# Patient Record
Sex: Male | Born: 1935 | Race: White | Hispanic: No | Marital: Married | Smoking: Former smoker
Health system: Southern US, Community
[De-identification: ages and names within clinical notes are randomized; demographics above are authoritative.]

## PROBLEM LIST (undated history)

## (undated) DIAGNOSIS — Z87442 Personal history of urinary calculi: Secondary | ICD-10-CM

## (undated) DIAGNOSIS — M1990 Unspecified osteoarthritis, unspecified site: Secondary | ICD-10-CM

## (undated) DIAGNOSIS — I10 Essential (primary) hypertension: Secondary | ICD-10-CM

## (undated) DIAGNOSIS — I219 Acute myocardial infarction, unspecified: Secondary | ICD-10-CM

## (undated) DIAGNOSIS — I2510 Atherosclerotic heart disease of native coronary artery without angina pectoris: Secondary | ICD-10-CM

## (undated) HISTORY — PX: TONSILLECTOMY: SUR1361

## (undated) HISTORY — PX: JOINT REPLACEMENT: SHX530

## (undated) HISTORY — PX: INNER EAR SURGERY: SHX679

---

## 1964-06-30 HISTORY — PX: KIDNEY STONE SURGERY: SHX686

## 1996-06-30 DIAGNOSIS — I219 Acute myocardial infarction, unspecified: Secondary | ICD-10-CM

## 1996-06-30 HISTORY — DX: Acute myocardial infarction, unspecified: I21.9

## 1996-06-30 HISTORY — PX: BYPASS GRAFT: SHX909

## 1996-06-30 HISTORY — PX: CORONARY ARTERY BYPASS GRAFT: SHX141

## 1997-06-30 HISTORY — PX: OTHER SURGICAL HISTORY: SHX169

## 1999-06-03 ENCOUNTER — Encounter: Payer: Self-pay | Admitting: Orthopedic Surgery

## 1999-06-10 ENCOUNTER — Encounter: Payer: Self-pay | Admitting: Orthopedic Surgery

## 1999-06-10 ENCOUNTER — Inpatient Hospital Stay (HOSPITAL_COMMUNITY): Admission: RE | Admit: 1999-06-10 | Discharge: 1999-06-14 | Payer: Self-pay | Admitting: Orthopedic Surgery

## 2000-03-26 ENCOUNTER — Ambulatory Visit (HOSPITAL_COMMUNITY): Admission: RE | Admit: 2000-03-26 | Discharge: 2000-03-27 | Payer: Self-pay | Admitting: Cardiovascular Disease

## 2010-06-13 ENCOUNTER — Encounter
Admission: RE | Admit: 2010-06-13 | Discharge: 2010-06-13 | Payer: Self-pay | Source: Home / Self Care | Attending: Otolaryngology | Admitting: Otolaryngology

## 2010-06-18 ENCOUNTER — Encounter
Admission: RE | Admit: 2010-06-18 | Discharge: 2010-06-18 | Payer: Self-pay | Source: Home / Self Care | Attending: Otolaryngology | Admitting: Otolaryngology

## 2012-10-21 ENCOUNTER — Other Ambulatory Visit (HOSPITAL_COMMUNITY): Payer: Self-pay | Admitting: Cardiovascular Disease

## 2012-10-21 DIAGNOSIS — E782 Mixed hyperlipidemia: Secondary | ICD-10-CM

## 2012-10-21 DIAGNOSIS — I10 Essential (primary) hypertension: Secondary | ICD-10-CM

## 2012-10-21 DIAGNOSIS — I2510 Atherosclerotic heart disease of native coronary artery without angina pectoris: Secondary | ICD-10-CM

## 2012-11-03 ENCOUNTER — Ambulatory Visit (HOSPITAL_COMMUNITY)
Admission: RE | Admit: 2012-11-03 | Discharge: 2012-11-03 | Disposition: A | Discharge status: Home / Self Care | Payer: Medicare Other | Source: Ambulatory Visit | Attending: Cardiovascular Disease | Admitting: Cardiovascular Disease

## 2012-11-03 DIAGNOSIS — R42 Dizziness and giddiness: Secondary | ICD-10-CM | POA: Insufficient documentation

## 2012-11-03 DIAGNOSIS — R5381 Other malaise: Secondary | ICD-10-CM | POA: Insufficient documentation

## 2012-11-03 DIAGNOSIS — E782 Mixed hyperlipidemia: Secondary | ICD-10-CM

## 2012-11-03 DIAGNOSIS — I2510 Atherosclerotic heart disease of native coronary artery without angina pectoris: Secondary | ICD-10-CM

## 2012-11-03 DIAGNOSIS — R5383 Other fatigue: Secondary | ICD-10-CM | POA: Insufficient documentation

## 2012-11-03 DIAGNOSIS — I10 Essential (primary) hypertension: Secondary | ICD-10-CM | POA: Insufficient documentation

## 2012-11-03 MED ORDER — TECHNETIUM TC 99M SESTAMIBI GENERIC - CARDIOLITE
10.2000 | Freq: Once | INTRAVENOUS | Status: AC | PRN
  Administered 2012-11-03: 10 via INTRAVENOUS

## 2012-11-03 MED ORDER — TECHNETIUM TC 99M SESTAMIBI GENERIC - CARDIOLITE
31.0000 | Freq: Once | INTRAVENOUS | Status: AC | PRN
  Administered 2012-11-03: 31 via INTRAVENOUS

## 2012-11-03 NOTE — Procedures (Addendum)
 [facility] [address] [place] [address] [place] [address] [phone]  Cardiology Nuclear Med Study  Jared Dunlap is a 79 y.o. male     MRN : [id number]     DOB: 05/11/1932  Procedure Date: 11/03/2012  Nuclear Med Background Indication for Stress Test:  Graft Patency, Stent Patency and PTCA Patency History:  CAD;MI;CABG X6--01/1997;STENT/PTCA--03/26/2000 Cardiac Risk Factors: Family History - CAD, History of Smoking, Hypertension, Lipids and Overweight  Symptoms:  Light-Headedness   Nuclear Pre-Procedure Caffeine/Decaff Intake:  7:00pm NPO After: 5:00am   IV Site: R Hand  IV 0.9% NS with Angio Cath:  22g  Chest Size (in):  42"  IV Started by: Emmit Pomfret, RN  Height: 5\' 4"  (1.626 m)  Cup Size: n/a  BMI:  Body mass index is 30.02 kg/(m^2). Weight:  175 lb (79.379 kg)   Tech Comments:  N/A    Nuclear Med Study 1 or 2 day study: 1 day  Stress Test Type:  Stress  Order Authorizing Provider:  Nicki Guadalajara, MD   Resting Radionuclide: Technetium 89m Sestamibi  Resting Radionuclide Dose: 10.2 mCi   Stress Radionuclide:  Technetium 61m Sestamibi  Stress Radionuclide Dose: 31.0 mCi           Stress Protocol Rest HR: 67 Stress HR: 139  Rest BP: 164/87 Stress BP: 197/85  Exercise Time (min): 6:30 METS: 7.8   Predicted Max HR: 142 bpm % Max HR: 97.89 bpm Rate Pressure Product: 21308  Dose of Adenosine (mg):  n/a Dose of Lexiscan: n/a mg  Dose of Atropine (mg): n/a Dose of Dobutamine: n/a mcg/kg/min (at max HR)  Stress Test Technologist: Esperanza Sheets, CCT Nuclear Technologist: Gonzella Lex, CNMT   Rest Procedure:  Myocardial perfusion imaging was performed at rest 45 minutes following the intravenous administration of Technetium 66m Sestamibi. Stress Procedure:  The patient performed treadmill exercise using a Bruce  Protocol for 6:30 minutes. The patient stopped due to Fatigue and denied any chest pain.  There were no  significant ST-T wave changes.  Technetium 66m Sestamibi was injected at peak exercise and myocardial perfusion imaging was performed after a brief delay.  Transient Ischemic Dilatation (Normal <1.22):  0.99 Lung/Heart Ratio (Normal <0.45):  0.26 QGS EDV:  n/a ml QGS ESV:  n/a ml LV Ejection Fraction: Study not gated  Rest ECG: NSR - Normal EKG  Stress ECG: There are scattered PVCs.couplets and triplets.  QPS Raw Data Images:  Normal; no motion artifact; normal heart/lung ratio. Stress Images:  Fixed anteroseptal and apical defect and basal lateral defect Rest Images:  Fixed anteroseptal and apical defect and basal lateral defect Subtraction (SDS):  No evidence of ischemia.  Impression Exercise Capacity:  Fair exercise capacity. BP Response:  Hypertensive blood pressure response. Clinical Symptoms:  No significant symptoms noted. ECG Impression:  There are scattered PVCs. Comparison with Prior Nuclear Study: No significant change from previous study  Overall Impression:  Low risk stress nuclear study with fixed anteroseptal and apical defects and basal lateral defects which may represent scar. No reversible ischemia.  LV Wall Motion:  Non-gated study.  Jared Nose, MD, [facility] Board Certified in Nuclear Cardiology Attending Cardiologist [facility]  Jared Nose, MD  11/03/2012 2:55 PM

## 2012-11-26 ENCOUNTER — Telehealth: Payer: Self-pay | Admitting: *Deleted

## 2012-11-26 NOTE — Telephone Encounter (Signed)
Results of stress test given.

## 2012-11-26 NOTE — Telephone Encounter (Signed)
Message copied by Gaynelle Cage on Fri Nov 26, 2012  5:46 PM ------      Message from: Nicki Guadalajara A      Created: Thu Nov 25, 2012  4:12 PM       Low risk, old small scar ------

## 2012-12-13 ENCOUNTER — Ambulatory Visit: Payer: Medicare Other | Admitting: Cardiovascular Disease

## 2012-12-28 ENCOUNTER — Other Ambulatory Visit: Payer: Self-pay | Admitting: *Deleted

## 2012-12-28 MED ORDER — BENAZEPRIL HCL 40 MG PO TABS: 40.0000 mg | ORAL_TABLET | Freq: Every day | ORAL | Status: DC

## 2013-04-18 ENCOUNTER — Encounter: Payer: Self-pay | Admitting: Cardiovascular Disease

## 2013-04-18 ENCOUNTER — Ambulatory Visit (INDEPENDENT_AMBULATORY_CARE_PROVIDER_SITE_OTHER): Payer: Medicare Other | Admitting: Cardiovascular Disease

## 2013-04-18 VITALS — BP 150/80 | HR 62 | Ht 64.0 in | Wt 181.6 lb

## 2013-04-18 DIAGNOSIS — E785 Hyperlipidemia, unspecified: Secondary | ICD-10-CM

## 2013-04-18 DIAGNOSIS — I10 Essential (primary) hypertension: Secondary | ICD-10-CM

## 2013-04-18 DIAGNOSIS — I25810 Atherosclerosis of coronary artery bypass graft(s) without angina pectoris: Secondary | ICD-10-CM

## 2013-04-18 HISTORY — DX: Atherosclerosis of coronary artery bypass graft(s) without angina pectoris: I25.810

## 2013-04-18 HISTORY — DX: Essential (primary) hypertension: I10

## 2013-04-18 HISTORY — DX: Hyperlipidemia, unspecified: E78.5

## 2013-04-18 NOTE — Patient Instructions (Addendum)
Your physician recommends that you schedule a follow-up appointment in: 6 MONTHS 

## 2013-04-18 NOTE — Progress Notes (Signed)
Patient ID: Jared Dunlap, male   DOB: 05-20-1932, 79 y.o.   MRN: [id number]     HPI: Jared Dunlap is a 79 y.o. male who presents to the office for a one-year cardiology evaluation.  This Fines is now 79 years old. In August 1998 he underwent CABG surgery x6 with LIMA to the LAD, saphenous vein graft to diagonal, sequential vein graft to the L1 and L2, and vein graft to the right coronary artery. 2001 a Cardiolite study which was done routinely and the patient was asymptomatic immaturity new circumflex ischemia. Catheterization revealed total occlusion of the graft to the marginal 1 vessel but he did have a patent sequential limb from OM1-OM 2. There was a 95% native circumflex stenosis  And II was able to successfully perform PCI and stenting of the native circumflex vessel with a 3.5x15 mm bare metal  NIR stent. Subsequent nuclear perfusion studies has continued to show normal fairly normal perfusion. His last nuclear perfusion study was done in May 2014 which was low risk showed mild apical thinning and basal lateral bending without associated ischemia.  JaredDunlap is remaining active. He plays golf at least 3 days per week. He exercises at the gym at least 3 days per week both doing aerobics as well as weights. He remains active doing yard work as well. He does have a history of hyperlipidemia, hypertension. He will be seeing Dr. Pryor Montes if it next several weeks and completes the laboratory obtained at that time. His last laboratory in January 2014 did show a total cholesterol 145, triglyceride 66, HDL 30, LDL cholesterol 90.  History reviewed. No pertinent past medical history.  History reviewed. No pertinent past surgical history.  Allergies  Allergen Reactions  . Crestor [Rosuvastatin] Other (See Comments)    UNSPECIFIED  . Welchol [Colesevelam Hcl] Other (See Comments)    UNSPECIFIED  . Zetia [Ezetimibe] Other (See Comments)    UNSPECIFIED  . Zocor [Simvastatin] Other (See  Comments)    unspecified    Current Outpatient Prescriptions  Medication Sig Dispense Refill  . atorvastatin (LIPITOR) 20 MG tablet Take 20 mg by mouth daily.      . benazepril (LOTENSIN) 40 MG tablet Take 1 tablet (40 mg total) by mouth daily.  30 tablet  6  . Coenzyme Q10 (COQ10) 100 MG CAPS Take by mouth.      . Flaxseed, Linseed, (FLAX SEEDS PO) Take by mouth.      . folic acid (FOLVITE) 400 MCG tablet Take 400 mcg by mouth daily.      . metoprolol succinate (TOPROL-XL) 25 MG 24 hr tablet Take 25 mg by mouth daily.      . Misc Natural Products (OSTEO BI-FLEX TRIPLE STRENGTH PO) Take 1 tablet by mouth.      . Multiple Vitamin (MULTIVITAMIN) tablet Take 1 tablet by mouth daily.      . Omega-3 Fatty Acids (FISH OIL) 1000 MG CAPS Take by mouth.      . pyridOXINE (B-6) 50 MG tablet Take 50 mg by mouth daily.      . vitamin B-12 (CYANOCOBALAMIN) 500 MCG tablet Take 500 mcg by mouth daily.       No current facility-administered medications for this visit.    History   Social History  . Marital Status: Married    Spouse Name: N/A    Number of Children: N/A  . Years of Education: N/A   Occupational History  . Not on file.   Social History  Main Topics  . Smoking status: Former Smoker    Types: Cigarettes    Quit date: 04/19/1963  . Smokeless tobacco: Not on file  . Alcohol Use: No  . Drug Use: No  . Sexual Activity: Not on file   Other Topics Concern  . Not on file   Social History Narrative  . No narrative on file    Family History  Problem Relation Age of Onset  . Heart disease Mother   . Heart disease Father   . Heart disease Brother   . Cancer Brother   . Heart disease Brother   . Kidney Stones Brother    Socially he is married for 59 years. 3 children 4 grandchildren 3 great-grandchildren. There is no alcohol use. He quit tobacco in 1965.  ROS is negative for fevers, chills or night sweats. He denies visual symptoms. He denies cough. Denies sputum production.  He denies recent presyncope. He denies chest pressure. He denies abdominal pain. Denies nausea vomiting diarrhea. He denies hematochezia or hematuria. He denies claudication. He is unaware of significant leg swelling. He denies myalgias. He denies skin rash.   Other comprehensive 12 point system review is negative.  PE BP 150/80  Pulse 62  Ht 5\' 4"  (1.626 m)  Wt 181 lb 9.6 oz (82.373 kg)  BMI 31.16 kg/m2  Repeat BP 122/78 General: Alert, oriented, no distress.  Skin: normal turgor, no rashes HEENT: Normocephalic, atraumatic. Pupils round and reactive; sclera anicteric;no lid lag.  Nose without nasal septal hypertrophy Mouth/Parynx benign; Mallinpatti scale 3 Neck: No JVD, no carotid briuts Lungs: clear to ausculatation and percussion; no wheezing or rales Heart: RRR, s1 s2 normal 1/6 systolic murmur Abdomen: soft, nontender; no hepatosplenomehaly, BS+; abdominal aorta nontender and not dilated by palpation. Pulses 2+ Extremities: Trivial pretibial edema above the sock line; no clubbing cyanosis, Homan's sign negative  Neurologic: grossly nonfocal Psychologic: normal affect and mood.  ECG: Normal sinus rhythm. QTc interval 399 ms.  LABS:  BMET No results found for this basename: na, k, cl, co2, glucose, bun, creatinine, calcium, gfrnonaa, gfraa     Hepatic Function Panel  No results found for this basename: prot, albumin, ast, alt, alkphos, bilitot, bilidir, ibili     CBC No results found for this basename: wbc, rbc, hgb, hct, plt, mcv, mch, mchc, rdw, neutrabs, lymphsabs, monoabs, eosabs, basosabs     BNP No results found for this basename: probnp    Lipid Panel  No results found for this basename: chol, trig, hdl, cholhdl, vldl, ldlcalc     RADIOLOGY: No results found.    ASSESSMENT AND PLAN:  Jared Dunlap is now 18 years status post CABG surgery x6. In 2001 he was entirely asymptomatic but a nuclear perfusion study demonstrated a large area of  ischemia laterally and at that time cardiac catheterization revealed an occluded graft that supplied the circumflex marginal vessel. I performed a successful percutaneous coronary intervention to his native RCA. Subsequently, he has continued to do well. He remains very active. Target LDL is less than 70. He was having repeat laboratory check by Dr. Wylene Simmer in several weeks and I will ask that these be forwarded to me for my review. His blood pressure on repeat today was controlled at 122/78. Pulse is regular. The past, he had intolerance to [facility] as well as Zetia Crestor and simvastatin, but he seems to tolerate atorvastatin well. As long as he remains stable, we'll see him in 6 months a Cardiologic reevaluation or  sooner as needed.    Lennette Bihari, MD, [facility]  04/18/2013 10:33 AM

## 2013-04-19 ENCOUNTER — Encounter: Payer: Self-pay | Admitting: Cardiovascular Disease

## 2013-05-11 ENCOUNTER — Observation Stay (HOSPITAL_COMMUNITY)
Admission: EM | Admit: 2013-05-11 | Discharge: 2013-05-12 | Disposition: A | Discharge status: Home / Self Care | Payer: Medicare Other | Attending: Urology | Admitting: Urology

## 2013-05-11 ENCOUNTER — Emergency Department (HOSPITAL_COMMUNITY): Payer: Medicare Other

## 2013-05-11 ENCOUNTER — Observation Stay (HOSPITAL_COMMUNITY): Payer: Medicare Other | Admitting: Anesthesiology

## 2013-05-11 ENCOUNTER — Encounter (HOSPITAL_COMMUNITY): Payer: Medicare Other | Admitting: Anesthesiology

## 2013-05-11 ENCOUNTER — Encounter (HOSPITAL_COMMUNITY): Payer: Self-pay | Admitting: Emergency Medicine

## 2013-05-11 ENCOUNTER — Encounter (HOSPITAL_COMMUNITY)
Admission: EM | Disposition: A | Discharge status: Home / Self Care | Payer: Self-pay | Source: Home / Self Care | Attending: Urology

## 2013-05-11 DIAGNOSIS — Z96649 Presence of unspecified artificial hip joint: Secondary | ICD-10-CM | POA: Insufficient documentation

## 2013-05-11 DIAGNOSIS — I10 Essential (primary) hypertension: Secondary | ICD-10-CM | POA: Insufficient documentation

## 2013-05-11 DIAGNOSIS — I252 Old myocardial infarction: Secondary | ICD-10-CM | POA: Insufficient documentation

## 2013-05-11 DIAGNOSIS — N1330 Unspecified hydronephrosis: Secondary | ICD-10-CM | POA: Insufficient documentation

## 2013-05-11 DIAGNOSIS — Z951 Presence of aortocoronary bypass graft: Secondary | ICD-10-CM | POA: Insufficient documentation

## 2013-05-11 DIAGNOSIS — Z79899 Other long term (current) drug therapy: Secondary | ICD-10-CM | POA: Insufficient documentation

## 2013-05-11 DIAGNOSIS — N210 Calculus in bladder: Secondary | ICD-10-CM | POA: Insufficient documentation

## 2013-05-11 DIAGNOSIS — N200 Calculus of kidney: Secondary | ICD-10-CM | POA: Insufficient documentation

## 2013-05-11 DIAGNOSIS — I2510 Atherosclerotic heart disease of native coronary artery without angina pectoris: Secondary | ICD-10-CM | POA: Insufficient documentation

## 2013-05-11 DIAGNOSIS — Z87891 Personal history of nicotine dependence: Secondary | ICD-10-CM | POA: Insufficient documentation

## 2013-05-11 DIAGNOSIS — N201 Calculus of ureter: Principal | ICD-10-CM | POA: Insufficient documentation

## 2013-05-11 HISTORY — PX: CYSTOSCOPY WITH RETROGRADE PYELOGRAM, URETEROSCOPY AND STENT PLACEMENT: SHX5789

## 2013-05-11 HISTORY — DX: Atherosclerotic heart disease of native coronary artery without angina pectoris: I25.10

## 2013-05-11 HISTORY — DX: Essential (primary) hypertension: I10

## 2013-05-11 HISTORY — DX: Acute myocardial infarction, unspecified: I21.9

## 2013-05-11 LAB — POCT I-STAT, CHEM 8
BUN: 27 mg/dL — ABNORMAL HIGH (ref 6–23)
Creatinine, Ser: 1.4 mg/dL — ABNORMAL HIGH (ref 0.50–1.35)
Glucose, Bld: 148 mg/dL — ABNORMAL HIGH (ref 70–99)
Potassium: 4 mEq/L (ref 3.5–5.1)
Sodium: 142 mEq/L (ref 135–145)

## 2013-05-11 LAB — COMPREHENSIVE METABOLIC PANEL
ALT: 21 U/L (ref 0–53)
AST: 33 U/L (ref 0–37)
Alkaline Phosphatase: 62 U/L (ref 39–117)
CO2: 24 mEq/L (ref 19–32)
Calcium: 9 mg/dL (ref 8.4–10.5)
GFR calc Af Amer: 63 mL/min — ABNORMAL LOW (ref >90)
GFR calc non Af Amer: 54 mL/min — ABNORMAL LOW (ref >90)
Glucose, Bld: 149 mg/dL — ABNORMAL HIGH (ref 70–99)
Potassium: 3.9 mEq/L (ref 3.5–5.1)
Sodium: 140 mEq/L (ref 135–145)
Total Protein: 6.7 g/dL (ref 6.0–8.3)

## 2013-05-11 LAB — CBC
Hemoglobin: 13.7 g/dL (ref 13.0–17.0)
MCH: 31.9 pg (ref 26.0–34.0)
Platelets: 145 10*3/uL — ABNORMAL LOW (ref 150–400)
RBC: 4.3 MIL/uL (ref 4.22–5.81)

## 2013-05-11 LAB — URINE MICROSCOPIC-ADD ON

## 2013-05-11 LAB — URINALYSIS, ROUTINE W REFLEX MICROSCOPIC
Bilirubin Urine: NEGATIVE
Glucose, UA: NEGATIVE mg/dL
Specific Gravity, Urine: 1.019 (ref 1.005–1.030)

## 2013-05-11 SURGERY — CYSTOURETEROSCOPY, WITH RETROGRADE PYELOGRAM AND STENT INSERTION
Anesthesia: General | Site: Ureter | Laterality: Right | Wound class: Clean Contaminated

## 2013-05-11 MED ORDER — PROMETHAZINE HCL 25 MG/ML IJ SOLN: 6.2500 mg | INTRAMUSCULAR | Status: DC | PRN

## 2013-05-11 MED ORDER — OXYCODONE HCL 5 MG PO TABS: 5.0000 mg | ORAL_TABLET | Freq: Once | ORAL | Status: AC | PRN

## 2013-05-11 MED ORDER — HYDROMORPHONE HCL PF 1 MG/ML IJ SOLN: 0.2500 mg | INTRAMUSCULAR | Status: DC | PRN

## 2013-05-11 MED ORDER — HYDROMORPHONE HCL PF 1 MG/ML IJ SOLN
0.5000 mg | INTRAMUSCULAR | Status: AC | PRN
  Administered 2013-05-11 (×2): 0.5 mg via INTRAVENOUS

## 2013-05-11 MED ORDER — HYDROMORPHONE HCL PF 1 MG/ML IJ SOLN
1.0000 mg | Freq: Once | INTRAMUSCULAR | Status: AC
  Administered 2013-05-11: 1 mg via INTRAVENOUS
  Filled 2013-05-11: qty 1

## 2013-05-11 MED ORDER — FENTANYL CITRATE 0.05 MG/ML IJ SOLN
INTRAMUSCULAR | Status: DC | PRN
  Administered 2013-05-11: 25 ug via INTRAVENOUS
  Administered 2013-05-11: 75 ug via INTRAVENOUS

## 2013-05-11 MED ORDER — ONDANSETRON HCL 4 MG/2ML IJ SOLN
INTRAMUSCULAR | Status: DC | PRN
  Administered 2013-05-11: 4 mg via INTRAVENOUS

## 2013-05-11 MED ORDER — SODIUM CHLORIDE 0.9 % IR SOLN
Status: DC | PRN
  Administered 2013-05-11: 20:00:00 2000 mL

## 2013-05-11 MED ORDER — SODIUM CHLORIDE 0.45 % IV SOLN
INTRAVENOUS | Status: AC
  Administered 2013-05-11: 11:00:00 100 mL/h via INTRAVENOUS

## 2013-05-11 MED ORDER — PHENYLEPHRINE HCL 10 MG/ML IJ SOLN
INTRAMUSCULAR | Status: DC | PRN
  Administered 2013-05-11: 40 ug via INTRAVENOUS

## 2013-05-11 MED ORDER — ONDANSETRON HCL 4 MG/2ML IJ SOLN: 4.0000 mg | Freq: Four times a day (QID) | INTRAMUSCULAR | Status: DC | PRN

## 2013-05-11 MED ORDER — METOPROLOL SUCCINATE ER 25 MG PO TB24
25.0000 mg | ORAL_TABLET | Freq: Every day | ORAL | Status: DC
  Administered 2013-05-11: 25 mg via ORAL
  Filled 2013-05-11 (×2): qty 1

## 2013-05-11 MED ORDER — BELLADONNA ALKALOIDS-OPIUM 16.2-60 MG RE SUPP
RECTAL | Status: AC
  Filled 2013-05-11: qty 1

## 2013-05-11 MED ORDER — METOPROLOL SUCCINATE ER 25 MG PO TB24: 25.0000 mg | ORAL_TABLET | Freq: Every day | ORAL | Status: DC

## 2013-05-11 MED ORDER — 0.9 % SODIUM CHLORIDE (POUR BTL) OPTIME
TOPICAL | Status: DC | PRN
  Administered 2013-05-11: 20:00:00 1000 mL

## 2013-05-11 MED ORDER — LACTATED RINGERS IV SOLN
INTRAVENOUS | Status: DC | PRN
  Administered 2013-05-11: 22:00:00
  Administered 2013-05-11: 20:00:00 via INTRAVENOUS

## 2013-05-11 MED ORDER — PROPOFOL INFUSION 10 MG/ML OPTIME
INTRAVENOUS | Status: DC | PRN
  Administered 2013-05-11: 15 mL via INTRAVENOUS

## 2013-05-11 MED ORDER — TAMSULOSIN HCL 0.4 MG PO CAPS
0.4000 mg | ORAL_CAPSULE | Freq: Every day | ORAL | Status: DC
  Administered 2013-05-11 – 2013-05-12 (×2): 0.4 mg via ORAL
  Filled 2013-05-11 (×2): qty 1

## 2013-05-11 MED ORDER — HYDROMORPHONE HCL PF 1 MG/ML IJ SOLN
0.5000 mg | INTRAMUSCULAR | Status: DC | PRN
  Administered 2013-05-12: 0.5 mg via INTRAVENOUS
  Filled 2013-05-11 (×3): qty 1

## 2013-05-11 MED ORDER — LIDOCAINE HCL 2 % EX GEL
CUTANEOUS | Status: AC
  Filled 2013-05-11: qty 10

## 2013-05-11 MED ORDER — IOHEXOL 300 MG/ML  SOLN
INTRAMUSCULAR | Status: DC | PRN
  Administered 2013-05-11: 20:00:00 35 mL

## 2013-05-11 MED ORDER — BENAZEPRIL HCL 40 MG PO TABS
40.0000 mg | ORAL_TABLET | Freq: Every day | ORAL | Status: DC
  Administered 2013-05-12: 40 mg via ORAL
  Filled 2013-05-11: qty 1

## 2013-05-11 MED ORDER — GENTAMICIN SULFATE 40 MG/ML IJ SOLN
345.0000 mg | INTRAVENOUS | Status: AC
  Administered 2013-05-11: 20:00:00 399.5 mg via INTRAVENOUS
  Filled 2013-05-11: qty 8.75

## 2013-05-11 MED ORDER — ONDANSETRON HCL 4 MG/2ML IJ SOLN
4.0000 mg | Freq: Once | INTRAMUSCULAR | Status: AC
  Administered 2013-05-11: 4 mg via INTRAVENOUS
  Filled 2013-05-11: qty 2

## 2013-05-11 MED ORDER — OXYCODONE HCL 5 MG/5ML PO SOLN: 5.0000 mg | Freq: Once | ORAL | Status: AC | PRN

## 2013-05-11 MED ORDER — LIDOCAINE HCL (PF) 2 % IJ SOLN
INTRAMUSCULAR | Status: DC | PRN
  Administered 2013-05-11: 60 mg

## 2013-05-11 MED ORDER — MEPERIDINE HCL 25 MG/ML IJ SOLN: 6.2500 mg | INTRAMUSCULAR | Status: DC | PRN

## 2013-05-11 SURGICAL SUPPLY — 32 items
BAG URINE DRAINAGE (UROLOGICAL SUPPLIES) ×2 IMPLANT
BASKET LASER NITINOL 1.9FR (BASKET) IMPLANT
BASKET STNLS GEMINI 4WIRE 3FR (BASKET) IMPLANT
BASKET ZERO TIP NITINOL 2.4FR (BASKET) IMPLANT
BSKT STON RTRVL 120 1.9FR (BASKET)
BSKT STON RTRVL GEM 120X11 3FR (BASKET)
BSKT STON RTRVL ZERO TP 2.4FR (BASKET)
CATH INTERMIT  6FR 70CM (CATHETERS) ×2 IMPLANT
CATH TIEMANN FOLEY 18FR 5CC (CATHETERS) ×2 IMPLANT
DRAPE CAMERA CLOSED 9X96 (DRAPES) ×2 IMPLANT
ELECT REM PT RETURN 9FT ADLT (ELECTROSURGICAL)
ELECTRODE REM PT RTRN 9FT ADLT (ELECTROSURGICAL) IMPLANT
FIBER LASER FLEXIVA 200 (UROLOGICAL SUPPLIES) IMPLANT
FIBER LASER FLEXIVA 365 (UROLOGICAL SUPPLIES) IMPLANT
GLOVE BIOGEL M STRL SZ7.5 (GLOVE) ×2 IMPLANT
GLOVE BIOGEL PI IND STRL 7.0 (GLOVE) ×1 IMPLANT
GLOVE BIOGEL PI INDICATOR 7.0 (GLOVE) ×1
GOWN PREVENTION PLUS LG XLONG (DISPOSABLE) ×4 IMPLANT
GUIDEWIRE ANG ZIPWIRE 038X150 (WIRE) ×2 IMPLANT
GUIDEWIRE STR DUAL SENSOR (WIRE) ×2 IMPLANT
HOLDER FOLEY CATH W/STRAP (MISCELLANEOUS) ×2 IMPLANT
IV NS 1000ML (IV SOLUTION) ×6
IV NS 1000ML BAXH (IV SOLUTION) ×3 IMPLANT
IV NS IRRIG 3000ML ARTHROMATIC (IV SOLUTION) ×2 IMPLANT
NS IRRIG 1000ML POUR BTL (IV SOLUTION) ×2 IMPLANT
PACK CYSTO (CUSTOM PROCEDURE TRAY) ×2 IMPLANT
SHEATH ACCESS URETERAL 38CM (SHEATH) ×2 IMPLANT
STENT CONTOUR 6FRX26X.038 (STENTS) ×2 IMPLANT
STENT CONTOUR 6FRX28X.038 (STENTS) ×2 IMPLANT
SYRINGE 10CC LL (SYRINGE) IMPLANT
SYRINGE IRR TOOMEY STRL 70CC (SYRINGE) IMPLANT
TUBE FEEDING 8FR 16IN STR KANG (MISCELLANEOUS) ×2 IMPLANT

## 2013-05-11 NOTE — H&P (Signed)
Jared Dunlap is an 79 y.o. male.    Chief Complaint: Rt Ureteral Stone, Refractory Pain  HPI:   1 - Recurrent Nephrolithiasis -  04/2013 - Rt 6mm proximal ureteral stone with hydro and small bilateral intrarenal stones by ER CT on eval right colickly flank pain Pre 2014: open left ureterolithtoomy, URS x several, medical passage x several, usually 1 episode every 5-10 years.   Today Jared Dunlap is admitted through ER for acute right colic from 6mm proximal stone. Pain refractory to PO meds. No fevers or infectious parameters. Pt with significant cardiac history including CAD/CABG but now with minimal limitation and recent low-risk nuclear stress test.   Past Medical History  Diagnosis Date  . Kidney stones   . Hypertension   . Coronary artery disease   . MI (myocardial infarction)     Past Surgical History  Procedure Laterality Date  . Bypass graft    . Stent removal    . Joint replacement      Left hip  . Kidney stone surgery    . Inner ear surgery    . Coronary artery bypass graft      6 vessels  . Tonsillectomy      Family History  Problem Relation Age of Onset  . Heart disease Mother   . Heart disease Father   . Heart disease Brother   . Cancer Brother   . Heart disease Brother   . Kidney Stones Brother    Social History:  reports that he quit smoking about 50 years ago. His smoking use included Cigarettes. He smoked 0.00 packs per day. He has never used smokeless tobacco. He reports that he does not drink alcohol or use illicit drugs.  Allergies:  Allergies  Allergen Reactions  . Crestor [Rosuvastatin] Other (See Comments)    UNSPECIFIED  . Welchol [Colesevelam Hcl] Other (See Comments)    UNSPECIFIED  . Zetia [Ezetimibe] Other (See Comments)    UNSPECIFIED  . Zocor [Simvastatin] Other (See Comments)    unspecified    Medications Prior to Admission  Medication Sig Dispense Refill  . atorvastatin (LIPITOR) 20 MG tablet Take 20 mg by mouth daily.      .  benazepril (LOTENSIN) 40 MG tablet Take 1 tablet (40 mg total) by mouth daily.  30 tablet  6  . Coenzyme Q10 (COQ10) 100 MG CAPS Take 1 tablet by mouth daily.       . Flaxseed, Linseed, (FLAX SEEDS PO) Take 1 tablet by mouth daily.       . folic acid (FOLVITE) 400 MCG tablet Take 400 mcg by mouth daily.      . metoprolol succinate (TOPROL-XL) 25 MG 24 hr tablet Take 25 mg by mouth daily.      . Misc Natural Products (OSTEO BI-FLEX TRIPLE STRENGTH PO) Take 1 tablet by mouth daily.       . Multiple Vitamin (MULTIVITAMIN WITH MINERALS) TABS tablet Take 1 tablet by mouth daily.      . Omega-3 Fatty Acids (FISH OIL) 1000 MG CAPS Take 1 capsule by mouth daily.       Marland Kitchen pyridOXINE (B-6) 50 MG tablet Take 50 mg by mouth daily.      . vitamin B-12 (CYANOCOBALAMIN) 500 MCG tablet Take 500 mcg by mouth daily.        Results for orders placed during the hospital encounter of 05/11/13 (from the past 48 hour(s))  CBC     Status: Abnormal   Collection Time  05/11/13  2:55 AM      Result Value Range   WBC 10.9 (*) 4.0 - 10.5 K/uL   RBC 4.30  4.22 - 5.81 MIL/uL   Hemoglobin 13.7  13.0 - 17.0 g/dL   HCT 16.1  09.6 - 04.5 %   MCV 92.8  78.0 - 100.0 fL   MCH 31.9  26.0 - 34.0 pg   MCHC 34.3  30.0 - 36.0 g/dL   RDW 40.9  81.1 - 91.4 %   Platelets 145 (*) 150 - 400 K/uL  COMPREHENSIVE METABOLIC PANEL     Status: Abnormal   Collection Time    05/11/13  2:55 AM      Result Value Range   Sodium 140  135 - 145 mEq/L   Potassium 3.9  3.5 - 5.1 mEq/L   Chloride 105  96 - 112 mEq/L   CO2 24  19 - 32 mEq/L   Glucose, Bld 149 (*) 70 - 99 mg/dL   BUN 26 (*) 6 - 23 mg/dL   Creatinine, Ser 7.82  0.50 - 1.35 mg/dL   Calcium 9.0  8.4 - 95.6 mg/dL   Total Protein 6.7  6.0 - 8.3 g/dL   Albumin 3.6  3.5 - 5.2 g/dL   AST 33  0 - 37 U/L   ALT 21  0 - 53 U/L   Alkaline Phosphatase 62  39 - 117 U/L   Total Bilirubin 0.4  0.3 - 1.2 mg/dL   GFR calc non Af Amer 54 (*) >90 mL/min   GFR calc Af Amer 63 (*) >90 mL/min    Comment: (NOTE)     The eGFR has been calculated using the CKD EPI equation.     This calculation has not been validated in all clinical situations.     eGFR's persistently <90 mL/min signify possible Chronic Kidney     Disease.  LIPASE, BLOOD     Status: None   Collection Time    05/11/13  2:55 AM      Result Value Range   Lipase 24  11 - 59 U/L  POCT I-STAT, CHEM 8     Status: Abnormal   Collection Time    05/11/13  3:00 AM      Result Value Range   Sodium 142  135 - 145 mEq/L   Potassium 4.0  3.5 - 5.1 mEq/L   Chloride 105  96 - 112 mEq/L   BUN 27 (*) 6 - 23 mg/dL   Creatinine, Ser 2.13 (*) 0.50 - 1.35 mg/dL   Glucose, Bld 086 (*) 70 - 99 mg/dL   Calcium, Ion 5.78  4.69 - 1.30 mmol/L   TCO2 22  0 - 100 mmol/L   Hemoglobin 13.6  13.0 - 17.0 g/dL   HCT 62.9  52.8 - 41.3 %  URINALYSIS, ROUTINE W REFLEX MICROSCOPIC     Status: Abnormal   Collection Time    05/11/13  6:15 AM      Result Value Range   Color, Urine YELLOW  YELLOW   APPearance CLEAR  CLEAR   Specific Gravity, Urine 1.019  1.005 - 1.030   pH 6.0  5.0 - 8.0   Glucose, UA NEGATIVE  NEGATIVE mg/dL   Hgb urine dipstick MODERATE (*) NEGATIVE   Bilirubin Urine NEGATIVE  NEGATIVE   Ketones, ur 15 (*) NEGATIVE mg/dL   Protein, ur NEGATIVE  NEGATIVE mg/dL   Urobilinogen, UA 0.2  0.0 - 1.0 mg/dL   Nitrite NEGATIVE  NEGATIVE   Leukocytes, UA SMALL (*) NEGATIVE  URINE MICROSCOPIC-ADD ON     Status: None   Collection Time    05/11/13  6:15 AM      Result Value Range   Squamous Epithelial / LPF RARE  RARE   WBC, UA 3-6  <3 WBC/hpf   RBC / HPF 21-50  <3 RBC/hpf   Bacteria, UA RARE  RARE   Urine-Other MUCOUS PRESENT     Ct Abdomen Pelvis Wo Contrast  05/11/2013   CLINICAL DATA:  Right lower quadrant abdominal and flank pain.  EXAM: CT ABDOMEN AND PELVIS WITHOUT CONTRAST  TECHNIQUE: Multidetector CT imaging of the abdomen and pelvis was performed following the standard protocol without intravenous contrast.   COMPARISON:  None.  FINDINGS: The visualized lung bases are clear. A tiny hiatal hernia is noted. Diffuse coronary calcifications are seen. Dense calcification is noted at the mitral valve.  The liver and spleen are unremarkable in appearance. The gallbladder is within normal limits. The pancreas and adrenal glands are unremarkable.  Minimal right-sided hydronephrosis is noted, with a 6 x 3 mm obstructing stone noted 4 cm below the right renal pelvis. Prominent perinephric stranding and fluid is noted bilaterally. Scattered bilateral renal cysts are seen, measuring up to 3.3 cm in size, parapelvic in nature on the left side. Scattered nonobstructing bilateral renal stones are identified.  No free fluid is identified. The small bowel is unremarkable in appearance. The stomach is within normal limits. No acute vascular abnormalities are seen.  The appendix is normal in caliber, without evidence for appendicitis. Mild diverticulosis is noted along the distal descending and proximal sigmoid colon, without evidence of diverticulitis. The colon is otherwise unremarkable in appearance.  The bladder is mildly distended; a prominent diverticulum is noted arising at the posterior aspect of the bladder, containing multiple stones. Mild posterior bladder wall thickening is nonspecific in appearance. Malignancy cannot be entirely excluded, though it could reflect chronic inflammation. The prostate is borderline normal in size. Diffuse associated calcifications are seen. A small left inguinal hernia is noted, containing only fat. No inguinal lymphadenopathy is seen.  No acute osseous abnormalities are identified. A left hip arthroplasty is unremarkable in appearance.  IMPRESSION: 1. Minimal right-sided hydronephrosis, with a 6 x 3 mm obstructing stone noted 4 cm below the right renal pelvis. 2. Scattered bilateral renal cysts, parapelvic in nature on the left side. Scattered nonobstructing bilateral renal stones seen. 3. Diffuse  coronary artery calcifications seen. Dense calcification at the mitral valve. 4. Tiny hiatal hernia seen. 5. Mild diverticulosis along the distal descending and proximal sigmoid colon, without evidence of diverticulitis. 6. Prominent diverticulum noted arising at the posterior aspect of the bladder, containing multiple stones. Mild posterior bladder wall thickening is nonspecific; malignancy cannot be entirely excluded, though it could reflect chronic inflammation. Further evaluation could be considered as deemed clinically appropriate. 7. Small left inguinal hernia, containing only fat.   Electronically Signed   By: Roanna Raider M.D.   On: 05/11/2013 04:42    Review of Systems  Constitutional: Negative.  Negative for fever and chills.  HENT: Negative.   Eyes: Negative.   Respiratory: Negative.   Cardiovascular: Negative.   Gastrointestinal: Positive for nausea and vomiting.  Genitourinary: Positive for flank pain.  Musculoskeletal: Negative.   Skin: Negative.   Neurological: Negative.   Endo/Heme/Allergies: Negative.   Psychiatric/Behavioral: Negative.     Blood pressure 178/73, pulse 98, temperature 98.1 F (36.7 C), temperature source Oral, resp. rate  19, height 5\' 5"  (1.651 m), weight 79.9 kg (176 lb 2.4 oz), SpO2 97.00%. Physical Exam  Constitutional: He is oriented to person, place, and time. He appears well-developed and well-nourished.  Family at bedside  HENT:  Head: Normocephalic and atraumatic.  Eyes: EOM are normal. Pupils are equal, round, and reactive to light.  Neck: Normal range of motion. Neck supple.  Cardiovascular: Normal rate.   Respiratory: Effort normal and breath sounds normal.  GI: Soft. Bowel sounds are normal.  Genitourinary: Penis normal.  Mild Rt CVAT  Musculoskeletal: Normal range of motion.  Neurological: He is alert and oriented to person, place, and time.  Skin: Skin is warm and dry.  Psychiatric: He has a normal mood and affect. His behavior is  normal. Judgment and thought content normal.     Assessment/Plan  1 - Recurrent Nephrolithiasis - Discussed options for acute stone including in/outpateint medical management, ureteroscopy, SWL, stent alos and he and family adamantly want to proceed with right ureteroscopic stone manipulation today if possible. I quoted abtou 90% success rate in single stage and abotu 10% chance of needeing staged approach with stent only today. We also discussed peri-op stents as well. Risks including bleeding, infection, non-cure, non-diagnosis, damage to kidney / ureter / bladder as well as very rare risks such as DVT,PE,MI,CVA,mortality discussed.   Posted today as add on. Admit for hydration, pain control in interval.    Loralai Eisman 05/11/2013, 1:11 PM

## 2013-05-11 NOTE — ED Notes (Signed)
Pt arrives via EMS from home. 2300 sudden onset RLQ abd and flank pain. No radiation. 150 mcg fentanyl. 12 lead unremarkable. Hx CABG, MI x2. 20 LH. 159/87 64 18 98% RA.

## 2013-05-11 NOTE — Progress Notes (Signed)
Assuming care of patient. Received report from Wilkie Aye. Agree with previous assessment. J.Xiao Graul, RN

## 2013-05-11 NOTE — ED Notes (Signed)
Pt taken to ct 

## 2013-05-11 NOTE — ED Provider Notes (Signed)
Patient was transferred from [facility] to [place] [facility] for operative intervention for kidney stone. Dr. Berneice Heinrich was the attending physician.  Patient's arrival S. with a briefly. He is still n.p.o. His pain is controlled. Please call Dr. Berneice Heinrich. He return my call immediately. He has evaluated the patient. He limits patient operating room now. Per his request I place him holding orders. Patient will go to the floor. Remain n.p.o. Return to the operating room for his interventions as possible.  Patient vital signs are stable. He is awake alert. His abdomen remains benign.  Roney Marion, MD 05/11/13 [phone]

## 2013-05-11 NOTE — ED Notes (Signed)
MD at bedside. UROLOGY HERE TO SEE PT AND FAMILY

## 2013-05-11 NOTE — ED Provider Notes (Signed)
CSN: [id number]     Arrival date & time 05/11/13  0213 History   First MD Initiated Contact with Patient 05/11/13 0255     Chief Complaint  Patient presents with  . Abdominal Pain   (Consider location/radiation/quality/duration/timing/severity/associated sxs/prior Treatment) HPI  history provided by patient. Sudden onset severe right flank pain onset tonight while sleeping around 11 PM. Pain does radiate some to right lower quadrant abdomen. No back pain. Has associated nausea and vomiting. Patient has a history of kidney stones, states that pain is typically tearing/ migrating. This pain is more sharp. No fevers or chills. No sick contacts. No trauma.     Past Medical History  Diagnosis Date  . Kidney stones   . Hypertension   . Coronary artery disease   . MI (myocardial infarction)    Past Surgical History  Procedure Laterality Date  . Bypass graft    . Stent removal     Family History  Problem Relation Age of Onset  . Heart disease Mother   . Heart disease Father   . Heart disease Brother   . Cancer Brother   . Heart disease Brother   . Kidney Stones Brother    History  Substance Use Topics  . Smoking status: Former Smoker    Types: Cigarettes    Quit date: 04/19/1963  . Smokeless tobacco: Not on file  . Alcohol Use: No    Review of Systems  Constitutional: Negative for fever and chills.  Respiratory: Negative for shortness of breath.   Cardiovascular: Negative for chest pain.  Gastrointestinal: Positive for vomiting and abdominal pain.  Genitourinary: Negative for dysuria.  Musculoskeletal: Negative for back pain, neck pain and neck stiffness.  Skin: Negative for rash.  Neurological: Negative for headaches.  All other systems reviewed and are negative.    Allergies  Crestor; Welchol; Zetia; and Zocor  Home Medications   Current Outpatient Rx  Name  Route  Sig  Dispense  Refill  . atorvastatin (LIPITOR) 20 MG tablet   Oral   Take 20 mg by mouth  daily.         . benazepril (LOTENSIN) 40 MG tablet   Oral   Take 1 tablet (40 mg total) by mouth daily.   30 tablet   6   . Coenzyme Q10 (COQ10) 100 MG CAPS   Oral   Take 1 tablet by mouth daily.          . Flaxseed, Linseed, (FLAX SEEDS PO)   Oral   Take 1 tablet by mouth daily.          . folic acid (FOLVITE) 400 MCG tablet   Oral   Take 400 mcg by mouth daily.         . metoprolol succinate (TOPROL-XL) 25 MG 24 hr tablet   Oral   Take 25 mg by mouth daily.         . Misc Natural Products (OSTEO BI-FLEX TRIPLE STRENGTH PO)   Oral   Take 1 tablet by mouth daily.          . Multiple Vitamin (MULTIVITAMIN WITH MINERALS) TABS tablet   Oral   Take 1 tablet by mouth daily.         . Omega-3 Fatty Acids (FISH OIL) 1000 MG CAPS   Oral   Take 1 capsule by mouth daily.          Marland Kitchen pyridOXINE (B-6) 50 MG tablet   Oral   Take 50 mg  by mouth daily.         . vitamin B-12 (CYANOCOBALAMIN) 500 MCG tablet   Oral   Take 500 mcg by mouth daily.          BP 149/63  Pulse 56  Temp(Src) 97.6 F (36.4 C) (Oral)  Resp 14  SpO2 96% Physical Exam  Constitutional: He is oriented to person, place, and time. He appears well-developed and well-nourished.  HENT:  Head: Normocephalic and atraumatic.  Eyes: EOM are normal. Pupils are equal, round, and reactive to light.  Neck: Neck supple.  Cardiovascular: Normal rate, regular rhythm and intact distal pulses.   Pulmonary/Chest: Effort normal and breath sounds normal. No respiratory distress.  Abdominal: Soft. He exhibits no distension. There is tenderness.  Tender over right flank, abdomen is soft throughout without rebound or guarding. No CVA tenderness.  Musculoskeletal: Normal range of motion. He exhibits no edema.  Neurological: He is alert and oriented to person, place, and time.  Skin: Skin is warm and dry.    ED Course  Procedures (including critical care time) Labs Review Results for orders placed  during the hospital encounter of 05/11/13  CBC      Result Value Range   WBC 10.9 (*) 4.0 - 10.5 K/uL   RBC 4.30  4.22 - 5.81 MIL/uL   Hemoglobin 13.7  13.0 - 17.0 g/dL   HCT 16.1  09.6 - 04.5 %   MCV 92.8  78.0 - 100.0 fL   MCH 31.9  26.0 - 34.0 pg   MCHC 34.3  30.0 - 36.0 g/dL   RDW 40.9  81.1 - 91.4 %   Platelets 145 (*) 150 - 400 K/uL  COMPREHENSIVE METABOLIC PANEL      Result Value Range   Sodium 140  135 - 145 mEq/L   Potassium 3.9  3.5 - 5.1 mEq/L   Chloride 105  96 - 112 mEq/L   CO2 24  19 - 32 mEq/L   Glucose, Bld 149 (*) 70 - 99 mg/dL   BUN 26 (*) 6 - 23 mg/dL   Creatinine, Ser 7.82  0.50 - 1.35 mg/dL   Calcium 9.0  8.4 - 95.6 mg/dL   Total Protein 6.7  6.0 - 8.3 g/dL   Albumin 3.6  3.5 - 5.2 g/dL   AST 33  0 - 37 U/L   ALT 21  0 - 53 U/L   Alkaline Phosphatase 62  39 - 117 U/L   Total Bilirubin 0.4  0.3 - 1.2 mg/dL   GFR calc non Af Amer 54 (*) >90 mL/min   GFR calc Af Amer 63 (*) >90 mL/min  LIPASE, BLOOD      Result Value Range   Lipase 24  11 - 59 U/L  POCT I-STAT, CHEM 8      Result Value Range   Sodium 142  135 - 145 mEq/L   Potassium 4.0  3.5 - 5.1 mEq/L   Chloride 105  96 - 112 mEq/L   BUN 27 (*) 6 - 23 mg/dL   Creatinine, Ser 2.13 (*) 0.50 - 1.35 mg/dL   Glucose, Bld 086 (*) 70 - 99 mg/dL   Calcium, Ion 5.78  4.69 - 1.30 mmol/L   TCO2 22  0 - 100 mmol/L   Hemoglobin 13.6  13.0 - 17.0 g/dL   HCT 62.9  52.8 - 41.3 %   Ct Abdomen Pelvis Wo Contrast  05/11/2013   CLINICAL DATA:  Right lower quadrant abdominal and flank pain.  EXAM: CT ABDOMEN AND PELVIS WITHOUT CONTRAST  TECHNIQUE: Multidetector CT imaging of the abdomen and pelvis was performed following the standard protocol without intravenous contrast.  COMPARISON:  None.  FINDINGS: The visualized lung bases are clear. A tiny hiatal hernia is noted. Diffuse coronary calcifications are seen. Dense calcification is noted at the mitral valve.  The liver and spleen are unremarkable in appearance. The  gallbladder is within normal limits. The pancreas and adrenal glands are unremarkable.  Minimal right-sided hydronephrosis is noted, with a 6 x 3 mm obstructing stone noted 4 cm below the right renal pelvis. Prominent perinephric stranding and fluid is noted bilaterally. Scattered bilateral renal cysts are seen, measuring up to 3.3 cm in size, parapelvic in nature on the left side. Scattered nonobstructing bilateral renal stones are identified.  No free fluid is identified. The small bowel is unremarkable in appearance. The stomach is within normal limits. No acute vascular abnormalities are seen.  The appendix is normal in caliber, without evidence for appendicitis. Mild diverticulosis is noted along the distal descending and proximal sigmoid colon, without evidence of diverticulitis. The colon is otherwise unremarkable in appearance.  The bladder is mildly distended; a prominent diverticulum is noted arising at the posterior aspect of the bladder, containing multiple stones. Mild posterior bladder wall thickening is nonspecific in appearance. Malignancy cannot be entirely excluded, though it could reflect chronic inflammation. The prostate is borderline normal in size. Diffuse associated calcifications are seen. A small left inguinal hernia is noted, containing only fat. No inguinal lymphadenopathy is seen.  No acute osseous abnormalities are identified. A left hip arthroplasty is unremarkable in appearance.  IMPRESSION: 1. Minimal right-sided hydronephrosis, with a 6 x 3 mm obstructing stone noted 4 cm below the right renal pelvis. 2. Scattered bilateral renal cysts, parapelvic in nature on the left side. Scattered nonobstructing bilateral renal stones seen. 3. Diffuse coronary artery calcifications seen. Dense calcification at the mitral valve. 4. Tiny hiatal hernia seen. 5. Mild diverticulosis along the distal descending and proximal sigmoid colon, without evidence of diverticulitis. 6. Prominent diverticulum  noted arising at the posterior aspect of the bladder, containing multiple stones. Mild posterior bladder wall thickening is nonspecific; malignancy cannot be entirely excluded, though it could reflect chronic inflammation. Further evaluation could be considered as deemed clinically appropriate. 7. Small left inguinal hernia, containing only fat.   Electronically Signed   By: Roanna Raider M.D.   On: 05/11/2013 04:42      EKG Interpretation     Ventricular Rate:  58 PR Interval:  190 QRS Duration: 93 QT Interval:  418 QTC Calculation: 410 R Axis:   18 Text Interpretation:  Sinus rhythm Nonspecific ST abnormality Baseline wander Abnormal ECG           IV fentanyl in route with no relief of symptoms. IV Dilaudid and Zofran provided Pain improved some, but is returning and repeat IV Dilaudid provided  6:20 AM d/w Urology, DR Berneice Heinrich accepts PT in [place] to [facility] ED - will evaluate after PT arrives.   MDM  DX: Right ureterolithiasis with associated hydro - persistent severe pain  Multiple rounds of IV narcotics with persistent severe pain Labs and imaging reviewed as above. Urinalysis pending Urology consult for that patient request and plan transfer to [facility] long ED to see Urology  Sunnie Nielsen, MD 05/11/13 [phone]

## 2013-05-11 NOTE — Transfer of Care (Signed)
Immediate Anesthesia Transfer of Care Note  Patient: Jared Dunlap  Procedure(s) Performed: Procedure(s): CYSTOSCOPY WITH RIGHT RETROGRADE PYELOGRAM,CYSTOLITHOPAXY/DIAGNOSTIC  DIGITAL FLEXIBLE RIGHT URETEROSCOPY WITH STENT PLACEMENT (Right)  Patient Location: PACU  Anesthesia Type:General  Level of Consciousness: awake and alert   Airway & Oxygen Therapy: Patient Spontanous Breathing and Patient connected to face mask oxygen  Post-op Assessment: Report given to PACU RN and Post -op Vital signs reviewed and stable  Post vital signs: Reviewed and stable  Complications: No apparent anesthesia complications

## 2013-05-11 NOTE — Anesthesia Postprocedure Evaluation (Signed)
  Anesthesia Post-op Note  Patient: Jared Dunlap  Procedure(s) Performed: Procedure(s): CYSTOSCOPY WITH RIGHT RETROGRADE PYELOGRAM,CYSTOLITHOPAXY/DIAGNOSTIC  DIGITAL FLEXIBLE RIGHT URETEROSCOPY WITH STENT PLACEMENT (Right)  Patient Location: PACU  Anesthesia Type:General  Level of Consciousness: awake and alert   Airway and Oxygen Therapy: Patient Spontanous Breathing and Patient connected to nasal cannula oxygen  Post-op Pain: none  Post-op Assessment: Post-op Vital signs reviewed and Patient's Cardiovascular Status Stable  Post-op Vital Signs: Reviewed and stable  Complications: No apparent anesthesia complications

## 2013-05-11 NOTE — Brief Op Note (Signed)
05/11/2013  8:55 PM  PATIENT:  Jared Dunlap  79 y.o. male  PRE-OPERATIVE DIAGNOSIS:  RIGHT URETERAL OBSTRUCTION  POST-OPERATIVE DIAGNOSIS:  RIGHT URETERAL STONE/HYDRONEPHROSIS REFRACTORY PAIN  PROCEDURE:  Procedure(s): CYSTOSCOPY WITH RIGHT RETROGRADE PYELOGRAM, DIGITAL FLEXIBLE URETEROSCOPY WITH STENT PLACEMENT (Right)  SURGEON:  Surgeon(s) and Role:    * Sebastian Ache, MD - Primary  PHYSICIAN ASSISTANT:   ASSISTANTS: none   ANESTHESIA:   general  EBL:     BLOOD ADMINISTERED:none  DRAINS: 44F coude foley to straiight drain   LOCAL MEDICATIONS USED:  NONE  SPECIMEN:  Source of Specimen:  bladder stone  DISPOSITION OF SPECIMEN:  given to patient  COUNTS:  YES  TOURNIQUET:  * No tourniquets in log *  DICTATION: .Other Dictation: Dictation Number [id number]  PLAN OF CARE: Admit for overnight observation  PATIENT DISPOSITION:  PACU - hemodynamically stable.   Delay start of Pharmacological VTE agent (>24hrs) due to surgical blood loss or risk of bleeding: no

## 2013-05-11 NOTE — Anesthesia Preprocedure Evaluation (Addendum)
Anesthesia Evaluation  Patient identified by MRN, date of birth, ID band Patient awake    Reviewed: Allergy & Precautions, H&P , NPO status , Patient's Chart, lab work & pertinent test results  Airway       Dental  (+) Dental Advisory Given   Pulmonary neg pulmonary ROS, former smoker,          Cardiovascular hypertension, Pt. on medications + CAD, + Past MI and + CABG     Neuro/Psych negative neurological ROS  negative psych ROS   GI/Hepatic negative GI ROS, Neg liver ROS,   Endo/Other  negative endocrine ROS  Renal/GU Renal disease     Musculoskeletal negative musculoskeletal ROS (+)   Abdominal   Peds  Hematology negative hematology ROS (+)   Anesthesia Other Findings   Reproductive/Obstetrics                          Anesthesia Physical Anesthesia Plan  ASA: III  Anesthesia Plan: General   Post-op Pain Management:    Induction: Intravenous  Airway Management Planned: LMA  Additional Equipment:   Intra-op Plan:   Post-operative Plan: Extubation in OR  Informed Consent: I have reviewed the patients History and Physical, chart, labs and discussed the procedure including the risks, benefits and alternatives for the proposed anesthesia with the patient or authorized representative who has indicated his/her understanding and acceptance.   Dental advisory given  Plan Discussed with: CRNA  Anesthesia Plan Comments:         Anesthesia Quick Evaluation

## 2013-05-11 NOTE — ED Notes (Signed)
Bed: WU98 Expected date:  Expected time:  Means of arrival:  Comments: Transfer from [facility]-kidney stone-please call Dr. Berneice Heinrich on arrival

## 2013-05-11 NOTE — ED Notes (Signed)
MD at bedside. 

## 2013-05-12 ENCOUNTER — Encounter (HOSPITAL_COMMUNITY): Payer: Self-pay | Admitting: Urology

## 2013-05-12 MED ORDER — TAMSULOSIN HCL 0.4 MG PO CAPS: 0.4000 mg | ORAL_CAPSULE | Freq: Every day | ORAL | Status: DC

## 2013-05-12 MED ORDER — OXYBUTYNIN CHLORIDE 5 MG PO TABS: 5.0000 mg | ORAL_TABLET | Freq: Three times a day (TID) | ORAL | Status: DC | PRN

## 2013-05-12 MED ORDER — TRAMADOL HCL 50 MG PO TABS: 50.0000 mg | ORAL_TABLET | Freq: Four times a day (QID) | ORAL | Status: DC | PRN

## 2013-05-12 MED ORDER — SENNOSIDES-DOCUSATE SODIUM 8.6-50 MG PO TABS: 1.0000 | ORAL_TABLET | Freq: Two times a day (BID) | ORAL | Status: DC

## 2013-05-12 NOTE — Progress Notes (Signed)
Discharge instructions explained using teach back, prescriptions given. Stable for discharge. 

## 2013-05-12 NOTE — Progress Notes (Signed)
Complains of rt flank cramping (severe). Notified Dr. Berneice Heinrich via OR nurse, awaiting call back.

## 2013-05-12 NOTE — Discharge Summary (Signed)
Physician Discharge Summary  Patient ID: EMAAD NANNA MRN: [id number] DOB/AGE: 07/18/1931 79 y.o.  Admit date: 05/11/2013 Discharge date: 05/12/2013  Admission Diagnoses: Rt ureteral Stone, Refractory Pain  Discharge Diagnoses:  Rt ureteral Stone, Refractory Pain  Discharged Condition: good  Hospital Course:   1 - Recurrent Nephrolithiasis - Pt admitted through ER 05/11/13 with Rt 6mm proximal ureteral stone with hydro and small bilateral intrarenal stones by ER CT on eval right colickly flank pain. Underwent operative cystolithalopexy and right ureteral stent as rt ureter too narrow for safe ureteroscopy to level of stone with plan for return for re-attempt in several weeks to allow for ureteral passive dilation.  By POD1, pt ambulatory, pain controlled, tolerating PO, and felt to be adequate for discharge.   Consults: None  Significant Diagnostic Studies: CT - stone as per above.  Treatments: surgery:  cystolithalopexy and right ureteral stent / diagnostic ureteroscopy 05/11/13  Discharge Exam: Blood pressure 124/57, pulse 61, temperature 98.7 F (37.1 C), temperature source Oral, resp. rate 16, height 5\' 5"  (1.651 m), weight 79.9 kg (176 lb 2.4 oz), SpO2 100.00%. General appearance: alert, cooperative, appears stated age and wife at bedside, Pt very vigorous for age. Head: Normocephalic, without obvious abnormality, atraumatic Eyes: conjunctivae/corneas clear. PERRL, EOM's intact. Fundi benign. Ears: normal TM's and external ear canals both ears Nose: Nares normal. Septum midline. Mucosa normal. No drainage or sinus tenderness. Throat: lips, mucosa, and tongue normal; teeth and gums normal Neck: no adenopathy, no carotid bruit, no JVD, supple, symmetrical, trachea midline and thyroid not enlarged, symmetric, no tenderness/mass/nodules Back: symmetric, no curvature. ROM normal. No CVA tenderness. Resp: clear to auscultation bilaterally Chest wall: no tenderness Cardio:  regular rate and rhythm, S1, S2 normal, no murmur, click, rub or gallop GI: soft, non-tender; bowel sounds normal; no masses,  no organomegaly Male genitalia: normal Extremities: extremities normal, atraumatic, no cyanosis or edema Pulses: 2+ and symmetric Skin: Skin color, texture, turgor normal. No rashes or lesions Lymph nodes: Cervical, supraclavicular, and axillary nodes normal. Neurologic: Grossly normal  Disposition:      Medication List         atorvastatin 20 MG tablet  Commonly known as:  LIPITOR  Take 20 mg by mouth daily.     benazepril 40 MG tablet  Commonly known as:  LOTENSIN  Take 1 tablet (40 mg total) by mouth daily.     CoQ10 100 MG Caps  Take 1 tablet by mouth daily.     Fish Oil 1000 MG Caps  Take 1 capsule by mouth daily.     FLAX SEEDS PO  Take 1 tablet by mouth daily.     folic acid 400 MCG tablet  Commonly known as:  FOLVITE  Take 400 mcg by mouth daily.     metoprolol succinate 25 MG 24 hr tablet  Commonly known as:  TOPROL-XL  Take 25 mg by mouth daily.     multivitamin with minerals Tabs tablet  Take 1 tablet by mouth daily.     OSTEO BI-FLEX TRIPLE STRENGTH PO  Take 1 tablet by mouth daily.     oxybutynin 5 MG tablet  Commonly known as:  DITROPAN  Take 1 tablet (5 mg total) by mouth every 8 (eight) hours as needed for bladder spasms (and stent discomfort).     pyridOXINE 50 MG tablet  Commonly known as:  B-6  Take 50 mg by mouth daily.     senna-docusate 8.6-50 MG per tablet  Commonly known as:  Senokot-S  Take 1 tablet by mouth 2 (two) times daily. While taking pain meds to prevent constipation.     tamsulosin 0.4 MG Caps capsule  Commonly known as:  FLOMAX  Take 1 capsule (0.4 mg total) by mouth daily. For urination and stent discomfort.     traMADol 50 MG tablet  Commonly known as:  ULTRAM  Take 1 tablet (50 mg total) by mouth every 6 (six) hours as needed for moderate pain. Post-operatively     vitamin B-12 500 MCG  tablet  Commonly known as:  CYANOCOBALAMIN  Take 500 mcg by mouth daily.           Follow-up Information   Follow up with Sebastian Ache, MD. (We will contact you with appointment in abotut 2-3 weeks.)    Specialty:  Urology   Contact information:   [address] [place] [address] [phone]       Signed: Sebastian Ache 05/12/2013, 7:26 AM

## 2013-05-12 NOTE — Progress Notes (Signed)
Utilization review completed.  

## 2013-05-13 NOTE — Op Note (Signed)
Jared Dunlap, Jared Dunlap              ACCOUNT NO.:  [id number]  MEDICAL RECORD NO.:  [id number]  LOCATION:  1435                         FACILITY:  [facility]  PHYSICIAN:  Sebastian Ache, MD     DATE OF BIRTH:  [birth year]  DATE OF PROCEDURE:05/11/2013 DATE OF DISCHARGE:                              OPERATIVE REPORT   PREOPERATIVE DIAGNOSES: 1. Right ureteral stone, refractory to flank pain. 2. Bladder diverticula with bladder stone.  PROCEDURES: 1. Cystoscopy with cystolitholapaxy. 2. Right retrograde pyelogram interpretation. 3. Right diagnostic ureteroscopy. 4. Insertion of right ureteral stent, 6 x 28, no tether.  FINDINGS: 1. Trabeculated bladder with diverticulum in the midline superior to     the trigone.  There were several small calcifications with this     that were irrigated via cystoscope. 2. Very mild right hydroureteronephrosis to filling defect in the     ureter. 3. Relative narrowing of the right ureter in the proximal third,     unable to pass the ureteroscope in apposition to the stone.  SPECIMEN:  Bladder stone given to family.  DRAINS:  An 18-French Foley catheter straight drain.  INDICATION:  Jared Dunlap is a pleasant 79 year old gentleman with history of recurrent nephrolithiasis status post multiple prior procedures and presented with one day prodrome of colicky right flank pain and was found to have a right proximal ureteral stone with hydronephrosis and his pain remained refractory and was unable to be controlled on oral medications.  Options were discussed including continued medical therapy versus stenting versus attempted definitive ureteroscopy and he wished to proceed with latter.  Notably, the patient also has several bladder stones and the posterior diverticulum on CT scan.  PROCEDURE IN DETAIL:  The patient being Jared Dunlap was verified. Procedure being right ureteroscopic stone manipulation was confirmed. Procedure was carried out.   Time-out was performed.  Intravenous antibiotics were administered.  General LMA anesthesia was reduced.  The patient was placed into a low lithotomy position.  Sterile field was created by prepping and draping the patient's penis, perineum, proximal thighs using iodine x3.  Next, cystourethroscopy was performed using a 22-French rigid scope with 12-degree offset lens.  His anterior- posterior urethra revealed some mild bilobar prostatic hypertrophy. Inspection of bladder revealed multifocal trabeculation.  There was a dominant diverticulum in posterior midline superior to the trigone. There were several calcifications and these were irrigated out.  A cystoscope was set aside for given to family.  Next, the right ureteral orifice was cannulated with a 6-French end-hole catheter and right retrograde pyelogram was obtained.  Right retrograde pyelogram demonstrates single right ureter, single system, right kidney.  There was mild hydronephrosis to the level questionable filling defect in the proximal ureter system, no stone. Next, 0.038 Glidewire was advanced at the level of the upper pole and set aside as a safety wire.  Next, semi-rigid ureteroscopy was performed of the distal two-thirds of the right ureter alongside a separate Sensor working wire.  No mucosal abnormalities or calcifications were found. An 8-French feeding tube was placed in urinary bladder for pressure release during these maneuvers.  Next, semi-rigid ureteroscope was exchanged through the 36 cm 12/14 ureteral access sheath, it was placed  under fluoroscopic vision to the level of the proximal left ureter. Next, flexible ureteroscopy was performed using 8-French digital ureteroscope towards the proximal third of the ureter and before encountering the stone, there was relative narrowing of the ureter, so it did not easily allow passage of the ureteroscope to the level where the stone was anticipated to be given relative  narrowing, this felt that without focal stricture, the safest way to proceed would be to stent today with plan for the stage approach with re-attempt of ureteroscopy for several weeks to allow for passive dilation, as such, the sheath was removed under continuous ureteroscopic vision and no mucosal abnormalities were found, and a new 6 x 26 double-J stent was placed over the remaining Glidewire.  The proximal coil appeared to be too low and as such, this was exchanged for a 6 x 28 stent, which easily coiled into the upper pole calyx at this point in the urinary bladder.  Given the coagulated bladder, diverticulum, there was some concern of preoperative urinary retention given the instrumentation, as such, an 18-French Foley catheter was placed for easier straight drain.  Procedure was then terminated.  The patient tolerated the procedure well and there were no immediate periprocedural complications.  The patient was taken to postanesthesia care unit in stable condition.          ______________________________ Sebastian Ache, MD     TM/MEDQ  D:  05/11/2013  T:  05/12/2013  Job:  [id number]

## 2013-06-01 ENCOUNTER — Other Ambulatory Visit: Payer: Self-pay | Admitting: Urology

## 2013-06-07 ENCOUNTER — Encounter (HOSPITAL_COMMUNITY): Payer: Self-pay | Admitting: Pharmacy Technician

## 2013-06-08 ENCOUNTER — Ambulatory Visit (HOSPITAL_COMMUNITY)
Admission: RE | Admit: 2013-06-08 | Discharge: 2013-06-08 | Disposition: A | Discharge status: Home / Self Care | Payer: Medicare Other | Source: Ambulatory Visit | Attending: Urology | Admitting: Urology

## 2013-06-08 ENCOUNTER — Encounter (HOSPITAL_COMMUNITY)
Admission: RE | Admit: 2013-06-08 | Discharge: 2013-06-08 | Disposition: A | Discharge status: Home / Self Care | Payer: Medicare Other | Source: Ambulatory Visit | Attending: Urology | Admitting: Urology

## 2013-06-08 ENCOUNTER — Encounter (HOSPITAL_COMMUNITY): Payer: Self-pay

## 2013-06-08 ENCOUNTER — Other Ambulatory Visit: Payer: Self-pay | Admitting: Urology

## 2013-06-08 DIAGNOSIS — Z01818 Encounter for other preprocedural examination: Secondary | ICD-10-CM | POA: Insufficient documentation

## 2013-06-08 DIAGNOSIS — Z01812 Encounter for preprocedural laboratory examination: Secondary | ICD-10-CM | POA: Insufficient documentation

## 2013-06-08 DIAGNOSIS — N200 Calculus of kidney: Secondary | ICD-10-CM | POA: Insufficient documentation

## 2013-06-08 HISTORY — DX: Personal history of urinary calculi: Z87.442

## 2013-06-08 LAB — BASIC METABOLIC PANEL
BUN: 24 mg/dL — ABNORMAL HIGH (ref 6–23)
Calcium: 9.4 mg/dL (ref 8.4–10.5)
Creatinine, Ser: 1.13 mg/dL (ref 0.50–1.35)
GFR calc Af Amer: 69 mL/min — ABNORMAL LOW (ref >90)
GFR calc non Af Amer: 60 mL/min — ABNORMAL LOW (ref >90)
Sodium: 140 mEq/L (ref 135–145)

## 2013-06-08 LAB — CBC
HCT: 41.1 % (ref 39.0–52.0)
MCH: 31.5 pg (ref 26.0–34.0)
MCHC: 34.1 g/dL (ref 30.0–36.0)
MCV: 92.4 fL (ref 78.0–100.0)
Platelets: 131 10*3/uL — ABNORMAL LOW (ref 150–400)
RBC: 4.45 MIL/uL (ref 4.22–5.81)
RDW: 13 % (ref 11.5–15.5)

## 2013-06-08 NOTE — Patient Instructions (Addendum)
[name]  06/08/2013                           YOUR PROCEDURE IS SCHEDULED ON: 06/10/13               PLEASE REPORT TO SHORT STAY CENTER AT : 8:30 am               CALL THIS NUMBER IF ANY PROBLEMS THE DAY OF SURGERY :               [phone]                      REMEMBER:   Do not eat food or drink liquids AFTER MIDNIGHT   Take these medicines the morning of surgery with A SIP OF WATER:  ditropan   Do not wear jewelry, make-up   Do not wear lotions, powders, or perfumes.   Do not shave legs or underarms 12 hrs. before surgery (men may shave face)  Do not bring valuables to the hospital.  Contacts, dentures or bridgework may not be worn into surgery.  Leave suitcase in the car. After surgery it may be brought to your room.  For patients admitted to the hospital more than one night, checkout time is 11:00                          The day of discharge.   Patients discharged the day of surgery will not be allowed to drive home                             If going home same day of surgery, must have someone stay with you first                           24 hrs at home and arrange for some one to drive you home from hospital.    Special Instructions:   Please read over the following fact sheets that you were given:                              1. [facility] PREPARING FOR SURGERY SHEET                                                X_____________________________________________________________________        Failure to follow these instructions may result in cancellation of your surgery

## 2013-06-08 NOTE — Progress Notes (Signed)
06/08/13 1322  OBSTRUCTIVE SLEEP APNEA  Have you ever been diagnosed with sleep apnea through a sleep study? No  Do you snore loudly (loud enough to be heard through closed doors)?  0  Do you often feel tired, fatigued, or sleepy during the daytime? 1  Has anyone observed you stop breathing during your sleep? 0  Do you have, or are you being treated for high blood pressure? 1  BMI more than 35 kg/m2? 0  Age over 77 years old? 1  Neck circumference greater than 40 cm/18 inches? 0  Gender: 1  Obstructive Sleep Apnea Score 4  Score 4 or greater  Results sent to PCP

## 2013-06-09 MED ORDER — GENTAMICIN SULFATE 40 MG/ML IJ SOLN
340.0000 mg | Freq: Once | INTRAVENOUS | Status: AC
  Administered 2013-06-10: 340 mg via INTRAVENOUS
  Filled 2013-06-09: qty 8.5

## 2013-06-10 ENCOUNTER — Ambulatory Visit (HOSPITAL_COMMUNITY)
Admission: RE | Admit: 2013-06-10 | Discharge: 2013-06-10 | Disposition: A | Discharge status: Home / Self Care | Payer: Medicare Other | Source: Ambulatory Visit | Attending: Urology | Admitting: Urology

## 2013-06-10 ENCOUNTER — Encounter (HOSPITAL_COMMUNITY): Payer: Self-pay | Admitting: *Deleted

## 2013-06-10 ENCOUNTER — Encounter (HOSPITAL_COMMUNITY): Payer: Medicare Other | Admitting: Certified Registered Nurse Anesthetist

## 2013-06-10 ENCOUNTER — Encounter (HOSPITAL_COMMUNITY)
Admission: RE | Disposition: A | Discharge status: Home / Self Care | Payer: Self-pay | Source: Ambulatory Visit | Attending: Urology

## 2013-06-10 ENCOUNTER — Ambulatory Visit (HOSPITAL_COMMUNITY): Payer: Medicare Other | Admitting: Certified Registered Nurse Anesthetist

## 2013-06-10 DIAGNOSIS — Z96649 Presence of unspecified artificial hip joint: Secondary | ICD-10-CM | POA: Insufficient documentation

## 2013-06-10 DIAGNOSIS — Z87891 Personal history of nicotine dependence: Secondary | ICD-10-CM | POA: Insufficient documentation

## 2013-06-10 DIAGNOSIS — Z951 Presence of aortocoronary bypass graft: Secondary | ICD-10-CM | POA: Insufficient documentation

## 2013-06-10 DIAGNOSIS — Z79899 Other long term (current) drug therapy: Secondary | ICD-10-CM | POA: Insufficient documentation

## 2013-06-10 DIAGNOSIS — I2510 Atherosclerotic heart disease of native coronary artery without angina pectoris: Secondary | ICD-10-CM | POA: Insufficient documentation

## 2013-06-10 DIAGNOSIS — I252 Old myocardial infarction: Secondary | ICD-10-CM | POA: Insufficient documentation

## 2013-06-10 DIAGNOSIS — N201 Calculus of ureter: Secondary | ICD-10-CM | POA: Insufficient documentation

## 2013-06-10 DIAGNOSIS — I10 Essential (primary) hypertension: Secondary | ICD-10-CM | POA: Insufficient documentation

## 2013-06-10 DIAGNOSIS — E785 Hyperlipidemia, unspecified: Secondary | ICD-10-CM | POA: Insufficient documentation

## 2013-06-10 HISTORY — PX: CYSTOSCOPY WITH RETROGRADE PYELOGRAM, URETEROSCOPY AND STENT PLACEMENT: SHX5789

## 2013-06-10 SURGERY — CYSTOURETEROSCOPY, WITH RETROGRADE PYELOGRAM AND STENT INSERTION
Anesthesia: General | Laterality: Right

## 2013-06-10 MED ORDER — ONDANSETRON HCL 4 MG/2ML IJ SOLN
INTRAMUSCULAR | Status: AC
  Filled 2013-06-10: qty 2

## 2013-06-10 MED ORDER — LIDOCAINE HCL (CARDIAC) 20 MG/ML IV SOLN
INTRAVENOUS | Status: AC
  Filled 2013-06-10: qty 5

## 2013-06-10 MED ORDER — HYDROMORPHONE HCL PF 1 MG/ML IJ SOLN: 0.2500 mg | INTRAMUSCULAR | Status: DC | PRN

## 2013-06-10 MED ORDER — LACTATED RINGERS IV SOLN
INTRAVENOUS | Status: DC
  Administered 2013-06-10: 10:00:00 via INTRAVENOUS

## 2013-06-10 MED ORDER — DEXAMETHASONE SODIUM PHOSPHATE 10 MG/ML IJ SOLN
INTRAMUSCULAR | Status: AC
  Filled 2013-06-10: qty 1

## 2013-06-10 MED ORDER — LACTATED RINGERS IV SOLN: INTRAVENOUS | Status: DC

## 2013-06-10 MED ORDER — LIDOCAINE HCL (CARDIAC) 20 MG/ML IV SOLN
INTRAVENOUS | Status: DC | PRN
  Administered 2013-06-10: 75 mg via INTRAVENOUS

## 2013-06-10 MED ORDER — PROMETHAZINE HCL 25 MG/ML IJ SOLN: 6.2500 mg | INTRAMUSCULAR | Status: DC | PRN

## 2013-06-10 MED ORDER — FENTANYL CITRATE 0.05 MG/ML IJ SOLN
INTRAMUSCULAR | Status: AC
Start: 2013-06-10 — End: 2013-06-10
  Filled 2013-06-10: qty 5

## 2013-06-10 MED ORDER — FENTANYL CITRATE 0.05 MG/ML IJ SOLN
INTRAMUSCULAR | Status: DC | PRN
  Administered 2013-06-10: 50 ug via INTRAVENOUS

## 2013-06-10 MED ORDER — MIDAZOLAM HCL 5 MG/5ML IJ SOLN
INTRAMUSCULAR | Status: DC | PRN
  Administered 2013-06-10 (×2): 1 mg via INTRAVENOUS

## 2013-06-10 MED ORDER — MIDAZOLAM HCL 2 MG/2ML IJ SOLN
INTRAMUSCULAR | Status: AC
  Filled 2013-06-10: qty 2

## 2013-06-10 MED ORDER — IOHEXOL 300 MG/ML  SOLN
INTRAMUSCULAR | Status: DC | PRN
  Administered 2013-06-10: 25 mL via INTRAVENOUS

## 2013-06-10 MED ORDER — DEXAMETHASONE SODIUM PHOSPHATE 10 MG/ML IJ SOLN
INTRAMUSCULAR | Status: DC | PRN
  Administered 2013-06-10: 10 mg via INTRAVENOUS

## 2013-06-10 MED ORDER — BELLADONNA ALKALOIDS-OPIUM 16.2-60 MG RE SUPP
RECTAL | Status: AC
  Filled 2013-06-10: qty 1

## 2013-06-10 MED ORDER — LIDOCAINE HCL 2 % EX GEL
CUTANEOUS | Status: AC
  Filled 2013-06-10: qty 10

## 2013-06-10 MED ORDER — PROPOFOL 10 MG/ML IV BOLUS
INTRAVENOUS | Status: DC | PRN
  Administered 2013-06-10: 150 mg via INTRAVENOUS
  Administered 2013-06-10: 25 mg via INTRAVENOUS

## 2013-06-10 MED ORDER — ONDANSETRON HCL 4 MG/2ML IJ SOLN
INTRAMUSCULAR | Status: DC | PRN
  Administered 2013-06-10 (×2): 2 mg via INTRAVENOUS

## 2013-06-10 MED ORDER — SULFAMETHOXAZOLE-TMP DS 800-160 MG PO TABS: 1.0000 | ORAL_TABLET | Freq: Two times a day (BID) | ORAL | Status: DC

## 2013-06-10 MED ORDER — SODIUM CHLORIDE 0.9 % IR SOLN
Status: DC | PRN
  Administered 2013-06-10: 2000 mL via INTRAVESICAL

## 2013-06-10 MED ORDER — PHENYLEPHRINE HCL 10 MG/ML IJ SOLN
INTRAMUSCULAR | Status: DC | PRN
  Administered 2013-06-10 (×2): 20 ug via INTRAVENOUS
  Administered 2013-06-10 (×4): 40 ug via INTRAVENOUS

## 2013-06-10 MED ORDER — TRAMADOL HCL 50 MG PO TABS: 50.0000 mg | ORAL_TABLET | Freq: Four times a day (QID) | ORAL | Status: DC | PRN

## 2013-06-10 MED ORDER — PROPOFOL 10 MG/ML IV BOLUS
INTRAVENOUS | Status: AC
  Filled 2013-06-10: qty 20

## 2013-06-10 MED ORDER — PHENYLEPHRINE HCL 10 MG/ML IJ SOLN
INTRAMUSCULAR | Status: AC
  Filled 2013-06-10: qty 1

## 2013-06-10 SURGICAL SUPPLY — 24 items
BAG URINE DRAINAGE (UROLOGICAL SUPPLIES) IMPLANT
BASKET LASER NITINOL 1.9FR (BASKET) ×2 IMPLANT
BASKET STNLS GEMINI 4WIRE 3FR (BASKET) IMPLANT
BASKET ZERO TIP NITINOL 2.4FR (BASKET) IMPLANT
BSKT STON RTRVL 120 1.9FR (BASKET) ×1
BSKT STON RTRVL GEM 120X11 3FR (BASKET)
BSKT STON RTRVL ZERO TP 2.4FR (BASKET)
CATH INTERMIT  6FR 70CM (CATHETERS) ×2 IMPLANT
DRAPE CAMERA CLOSED 9X96 (DRAPES) ×2 IMPLANT
ELECT REM PT RETURN 9FT ADLT (ELECTROSURGICAL)
ELECTRODE REM PT RTRN 9FT ADLT (ELECTROSURGICAL) IMPLANT
FIBER LASER FLEXIVA 200 (UROLOGICAL SUPPLIES) IMPLANT
FIBER LASER FLEXIVA 365 (UROLOGICAL SUPPLIES) IMPLANT
GLOVE BIOGEL M STRL SZ7.5 (GLOVE) ×2 IMPLANT
GOWN PREVENTION PLUS LG XLONG (DISPOSABLE) ×2 IMPLANT
GUIDEWIRE ANG ZIPWIRE 038X150 (WIRE) ×2 IMPLANT
GUIDEWIRE STR DUAL SENSOR (WIRE) ×2 IMPLANT
IV NS IRRIG 3000ML ARTHROMATIC (IV SOLUTION) IMPLANT
PACK CYSTO (CUSTOM PROCEDURE TRAY) ×2 IMPLANT
SHEATH ACCESS URETERAL 38CM (SHEATH) ×2 IMPLANT
STENT CONTOUR 6FRX28X.038 (STENTS) ×2 IMPLANT
SYRINGE 10CC LL (SYRINGE) IMPLANT
SYRINGE IRR TOOMEY STRL 70CC (SYRINGE) IMPLANT
TUBE FEEDING 8FR 16IN STR KANG (MISCELLANEOUS) ×2 IMPLANT

## 2013-06-10 NOTE — Anesthesia Preprocedure Evaluation (Signed)
Anesthesia Evaluation  Patient identified by MRN, date of birth, ID band Patient awake    Reviewed: Allergy & Precautions, H&P , NPO status , Patient's Chart, lab work & pertinent test results  Airway Mallampati: II TM Distance: >3 FB Neck ROM: Full    Dental  (+) Dental Advisory Given and Caps   Pulmonary neg pulmonary ROS, former smoker,  breath sounds clear to auscultation  Pulmonary exam normal       Cardiovascular hypertension, Pt. on medications and Pt. on home beta blockers + CAD, + Past MI, + Cardiac Stents and + CABG Rhythm:Regular Rate:Normal     Neuro/Psych negative neurological ROS  negative psych ROS   GI/Hepatic negative GI ROS, Neg liver ROS,   Endo/Other  negative endocrine ROS  Renal/GU Renal disease     Musculoskeletal negative musculoskeletal ROS (+)   Abdominal   Peds  Hematology negative hematology ROS (+)   Anesthesia Other Findings   Reproductive/Obstetrics                           Anesthesia Physical Anesthesia Plan  ASA: III  Anesthesia Plan: General   Post-op Pain Management:    Induction: Intravenous  Airway Management Planned: Oral ETT  Additional Equipment:   Intra-op Plan:   Post-operative Plan: Extubation in OR  Informed Consent: I have reviewed the patients History and Physical, chart, labs and discussed the procedure including the risks, benefits and alternatives for the proposed anesthesia with the patient or authorized representative who has indicated his/her understanding and acceptance.   Dental advisory given  Plan Discussed with: CRNA  Anesthesia Plan Comments:         Anesthesia Quick Evaluation

## 2013-06-10 NOTE — H&P (Signed)
Jared Dunlap is an 79 y.o. male.    Chief Complaint: Pre-Op Rt Ureteroscopic Stone Manipulation  HPI:   1 - Nephrolithiasis -  Pre 2014 - spontaneous passage x several, usually every 10-15 years. 04/2013 - right ureteral stent / attempt ureteroscopy for Rt 6mm ureteral stone, ureter too narrow therefore removal not performed at that time.  2 - Lower Urinary Tract Symptom / Small Bladder Stones - PT with few small bladder stones removed 04/2013. Moderate baseline obstructive voiding complaints, now on tamsulosin with dramatic improvement, very satisfied.   PMH sig for CAD/CABG (excercises three times per week, presently not limitig), ortho surgery, HLD, HTN.  Today Jared Dunlap is seen in to proceed with elective Rt ureteroscopic stone manipulation. No interval fevers. Most recent UCX negative.   Past Medical History  Diagnosis Date  . Hypertension   . MI (myocardial infarction) 1998     x2 MI's  . Coronary artery disease     followed by Dr. Tresa Endo - last office visit 06/08/13  . History of kidney stones     Past Surgical History  Procedure Laterality Date  . Bypass graft  1998  . Joint replacement      Left hip  . Kidney stone surgery  1966  . Inner ear surgery    . Tonsillectomy    . Cystoscopy with retrograde pyelogram, ureteroscopy and stent placement Right 05/11/2013    Procedure: CYSTOSCOPY WITH RIGHT RETROGRADE PYELOGRAM,CYSTOLITHOPAXY/DIAGNOSTIC  DIGITAL FLEXIBLE RIGHT URETEROSCOPY WITH STENT PLACEMENT;  Surgeon: Sebastian Ache, MD;  Location: [facility];  Service: Urology;  Laterality: Right;  . Coronary artery bypass graft  1998    6 vessels  . Stented coronary artery  1999    Family History  Problem Relation Age of Onset  . Heart disease Mother   . Heart disease Father   . Heart disease Brother   . Cancer Brother   . Heart disease Brother   . Kidney Stones Brother    Social History:  reports that he quit smoking about 50 years ago. His smoking use included  Cigarettes. He smoked 0.00 packs per day. He has never used smokeless tobacco. He reports that he does not drink alcohol or use illicit drugs.  Allergies:  Allergies  Allergen Reactions  . Crestor [Rosuvastatin] Other (See Comments)    UNSPECIFIED  . Welchol [Colesevelam Hcl] Other (See Comments)    UNSPECIFIED  . Zetia [Ezetimibe] Other (See Comments)    UNSPECIFIED  . Zocor [Simvastatin] Other (See Comments)    unspecified    Medications Prior to Admission  Medication Sig Dispense Refill  . atorvastatin (LIPITOR) 20 MG tablet Take 20 mg by mouth daily as needed (now and then).       . benazepril (LOTENSIN) 40 MG tablet Take 40 mg by mouth daily with breakfast.      . Coenzyme Q10 (COQ10) 100 MG CAPS Take 1 tablet by mouth daily.       . folic acid (FOLVITE) 400 MCG tablet Take 400 mcg by mouth daily.      . metoprolol succinate (TOPROL-XL) 25 MG 24 hr tablet Take 25 mg by mouth at bedtime.       . Multiple Vitamin (MULTIVITAMIN WITH MINERALS) TABS tablet Take 1 tablet by mouth daily.      Marland Kitchen oxybutynin (DITROPAN) 5 MG tablet Take 5 mg by mouth 2 (two) times daily.      Marland Kitchen pyridOXINE (B-6) 50 MG tablet Take 50 mg by mouth daily.      Marland Kitchen  tamsulosin (FLOMAX) 0.4 MG CAPS capsule Take 0.4 mg by mouth daily. For urination and stent discomfort.      . vitamin B-12 (CYANOCOBALAMIN) 500 MCG tablet Take 500 mcg by mouth daily.      . Flaxseed, Linseed, (FLAX SEEDS PO) Take 1 tablet by mouth daily.       . Misc Natural Products (OSTEO BI-FLEX TRIPLE STRENGTH PO) Take 1 tablet by mouth daily.       . Omega-3 Fatty Acids (FISH OIL) 1000 MG CAPS Take 1 capsule by mouth daily.         Results for orders placed during the hospital encounter of 06/08/13 (from the past 48 hour(s))  CBC     Status: Abnormal   Collection Time    06/08/13  1:50 PM      Result Value Range   WBC 7.3  4.0 - 10.5 K/uL   RBC 4.45  4.22 - 5.81 MIL/uL   Hemoglobin 14.0  13.0 - 17.0 g/dL   HCT 09.8  11.9 - 14.7 %   MCV  92.4  78.0 - 100.0 fL   MCH 31.5  26.0 - 34.0 pg   MCHC 34.1  30.0 - 36.0 g/dL   RDW 82.9  56.2 - 13.0 %   Platelets 131 (*) 150 - 400 K/uL  BASIC METABOLIC PANEL     Status: Abnormal   Collection Time    06/08/13  1:50 PM      Result Value Range   Sodium 140  135 - 145 mEq/L   Potassium 4.0  3.5 - 5.1 mEq/L   Chloride 106  96 - 112 mEq/L   CO2 27  19 - 32 mEq/L   Glucose, Bld 103 (*) 70 - 99 mg/dL   BUN 24 (*) 6 - 23 mg/dL   Creatinine, Ser 8.65  0.50 - 1.35 mg/dL   Calcium 9.4  8.4 - 78.4 mg/dL   GFR calc non Af Amer 60 (*) >90 mL/min   GFR calc Af Amer 69 (*) >90 mL/min   Comment: (NOTE)     The eGFR has been calculated using the CKD EPI equation.     This calculation has not been validated in all clinical situations.     eGFR's persistently <90 mL/min signify possible Chronic Kidney     Disease.   Dg Chest 2 View  06/08/2013   CLINICAL DATA:  Preoperative evaluation for kidney stone removal  EXAM: CHEST  2 VIEW  COMPARISON:  None  FINDINGS: The heart and pulmonary vascularity are within normal limits. Postsurgical changes are noted. The lungs are well-aerated without focal infiltrate or sizable effusion. No acute bony abnormality is noted. A ureteral stent is noted on the right.  IMPRESSION: No acute abnormality seen.   Electronically Signed   By: Alcide Clever M.D.   On: 06/08/2013 15:38    Review of Systems  Constitutional: Negative.  Negative for fever and chills.  HENT: Negative.   Eyes: Negative.   Respiratory: Negative.   Cardiovascular: Negative.   Gastrointestinal: Negative.  Negative for nausea and vomiting.  Genitourinary: Negative.   Musculoskeletal: Negative.   Skin: Negative.   Neurological: Negative.   Endo/Heme/Allergies: Negative.   Psychiatric/Behavioral: Negative.     Blood pressure 154/87, pulse 75, temperature 97.8 F (36.6 C), temperature source Oral, resp. rate 18, SpO2 98.00%. Physical Exam  Constitutional: He appears well-developed and  well-nourished.  HENT:  Head: Normocephalic and atraumatic.  Eyes: EOM are normal. Pupils are equal, round,  and reactive to light.  Neck: Normal range of motion. Neck supple.  Cardiovascular: Normal rate and regular rhythm.   Respiratory: Effort normal.  GI: Soft. Bowel sounds are normal.  Genitourinary: Penis normal.  Mild Rt CVAT  Musculoskeletal: Normal range of motion.  Neurological: He is alert.  Skin: Skin is warm and dry.  Psychiatric: He has a normal mood and affect. His behavior is normal. Judgment and thought content normal.     Assessment/Plan  1 - Nephrolithiasis -  Still with moderate sized rt ureteral stone in situ. Stent now in to allow for passive dilation.  We rediscussed ureteroscopic stone manipulation with basketing and laser-lithotripsy in detail.  We rediscussed risks including bleeding, infection, damage to kidney / ureter  bladder, rarely loss of kidney. We rediscussed anesthetic risks and rare but serious surgical complications including DVT, PE, MI, and mortality. We specifically readdressed that in 5-10% of cases a staged approach is required with stenting followed by re-attempt ureteroscopy if anatomy unfavorable. The patient voiced understanding and wises to proceed today as planned.    Alexius Hangartner 06/10/2013, 8:40 AM

## 2013-06-10 NOTE — Transfer of Care (Signed)
Immediate Anesthesia Transfer of Care Note  Patient: Jared Dunlap  Procedure(s) Performed: Procedure(s) (LRB): CYSTOSCOPY WITH RETROGRADE PYELOGRAM, URETEROSCOPY AND STENT PLACEMENT (Right)  Patient Location: PACU  Anesthesia Type: General  Level of Consciousness: sedated, patient cooperative and responds to stimulation  Airway & Oxygen Therapy: Patient Spontanous Breathing and Patient connected to face mask oxgen  Post-op Assessment: Report given to PACU RN and Post -op Vital signs reviewed and stable  Post vital signs: Reviewed and stable  Complications: No apparent anesthesia complications

## 2013-06-10 NOTE — Anesthesia Procedure Notes (Signed)
Procedure Name: LMA Insertion Date/Time: 06/10/2013 10:42 AM Performed by: Edison Pace Pre-anesthesia Checklist: Patient identified, Patient being monitored, Emergency Drugs available, Timeout performed and Suction available Patient Re-evaluated:Patient Re-evaluated prior to inductionOxygen Delivery Method: Circle system utilized Preoxygenation: Pre-oxygenation with 100% oxygen Intubation Type: IV induction LMA: LMA inserted LMA Size: 4.0 Number of attempts: 1 Placement Confirmation: positive ETCO2 Tube secured with: Tape Dental Injury: Teeth and Oropharynx as per pre-operative assessment

## 2013-06-10 NOTE — Anesthesia Postprocedure Evaluation (Signed)
Anesthesia Post Note  Patient: Jared Dunlap  Procedure(s) Performed: Procedure(s) (LRB): CYSTOSCOPY WITH RETROGRADE PYELOGRAM, URETEROSCOPY AND STENT PLACEMENT (Right)  Anesthesia type: General  Patient location: PACU  Post pain: Pain level controlled  Post assessment: Post-op Vital signs reviewed  Last Vitals:  Filed Vitals:   06/10/13 1400  BP: 160/73  Pulse: 66  Temp:   Resp: 18    Post vital signs: Reviewed  Level of consciousness: sedated  Complications: No apparent anesthesia complications

## 2013-06-10 NOTE — Brief Op Note (Signed)
06/10/2013  11:22 AM  PATIENT:  Jared Dunlap  79 y.o. male  PRE-OPERATIVE DIAGNOSIS:  RIGHT URETERAL STONE  POST-OPERATIVE DIAGNOSIS:  RIGHT URETERAL STONE  PROCEDURE:  Procedure(s): CYSTOSCOPY WITH RETROGRADE PYELOGRAM, URETEROSCOPY AND STENT PLACEMENT (Right)  SURGEON:  Surgeon(s) and Role:    * Sebastian Ache, MD - Primary  PHYSICIAN ASSISTANT:   ASSISTANTS: none   ANESTHESIA:   general  EBL:     BLOOD ADMINISTERED:none  DRAINS: none   LOCAL MEDICATIONS USED:  NONE  SPECIMEN:  Source of Specimen:  Rt Ureteral and Renal stones  DISPOSITION OF SPECIMEN:  [facility] for compositional analysis  COUNTS:  YES  TOURNIQUET:  * No tourniquets in log *  DICTATION: .Other Dictation: Dictation Number [id number]  PLAN OF CARE: Discharge to home after PACU  PATIENT DISPOSITION:  PACU - hemodynamically stable.   Delay start of Pharmacological VTE agent (>24hrs) due to surgical blood loss or risk of bleeding: not applicable

## 2013-06-10 NOTE — Progress Notes (Signed)
Pt noted to have small/minimal amount of blood drainange from penis.  Pt given peri pad to place in underwear prior to discharge.  String noted at end of penis taped and intact.

## 2013-06-13 ENCOUNTER — Encounter (HOSPITAL_COMMUNITY): Payer: Self-pay | Admitting: Urology

## 2013-06-13 NOTE — Op Note (Signed)
NAMECHRISTOP, Jared Dunlap              ACCOUNT NO.:  [id number]  MEDICAL RECORD NO.:  [id number]  LOCATION:                                 FACILITY:  PHYSICIAN:  Sebastian Ache, MD     DATE OF BIRTH:  December 08, 1931  DATE OF PROCEDURE: 06/10/2013 DATE OF DISCHARGE:                              OPERATIVE REPORT   DIAGNOSIS:  Right ureteral stone, status post prior stenting.  PROCEDURE: 1. Cystoscopy with right retrograde pyelogram interpretation. 2. Right ureteroscopy with basketing of stones. 3. Right ureteral stent change, 6 x 28, with tether to the dorsum of     the penis.  FINDINGS: 1. Unremarkable urinary bladder. 2. Unremarkable right retrograde pyelogram. 3. Several small right intrarenal stones, likely corresponding to     previous right renal stone plus ureteral stone pushed retrograde     with previous procedure.  ESTIMATED BLOOD LOSS:  Nil.  COMPLICATIONS:  None.  INDICATION:  Jared Dunlap is a pleasant 79 year old gentleman with history of recurrent nephrolithiasis.  He wound up with acute renal colic in November, 2014.  He underwent initial operative management with attempted right ureteroscopic stone manipulation.  However, due to relative narrowing of his right ureter, that could not be safely completed at that time.  As such, the decision was made to proceed with ureteral stenting to allow for passive dilation.  He has had his interval stent in for several weeks, has had no worrisome infectious parameters.  Now presents for repeat attempt endoscopic management of his right stone.  Informed consent was obtained and placed in medical record.  PROCEDURE IN DETAIL:  The patient being Jared Dunlap was verified. Procedure being right ureteroscopic stone manipulation was confirmed. Procedure was carried out.  Time-out was performed.  Intravenous antibiotics administered.  General LMA anesthesia was induced.  Patient placed into a low lithotomy position.  Sterile  field was created by prepping and draping the patient's penis, perineum, and proximal thighs using iodine x3.  Next, cystourethroscopy was performed using a 22- French rigid scope with 12-degree offset lens.  Inspection of the anterior and posterior urethra was unremarkable.  Inspection of the urinary bladder revealed no diverticula, calcifications, papillary lesions.  Distal end of the stent on the right side was seen in situ. This was grasped, brought to the level of the urethral meatus through which a 0.038 Glidewire was advanced at the level of the upper pole. The stent was exchanged for a 6-French end-hole catheter and right retrograde pyelogram was obtained.  Right retrograde pyelogram; using 10 mL of iodinated contrast, revealed a single right ureter, single system, right kidney.  There were no filling defects or narrowing noted.  A 0.038 Glidewire was then readvanced to the upper pole and set aside as a safety wire.  Next, semi- rigid ureteroscopy was performed for the entire length of the right ureter alongside a separate Sensor working wire.  An 8-French feeding tube in the urinary bladder for pressure release, this revealed no mucosal abnormalities or calcifications in the right ureter.  Next, the semi-rigid ureteroscope was exchanged for the 38-cm, 12/14 ureteral access sheath, which was placed over the working wire to the level of the proximal  ureter.  Next, flexible digital ureteroscopy was performed using 8-French digital ureteroscope.  Inspection the proximal right ureter and systematic inspection of the right kidney revealed multifocal small volume intrarenal stones, likely consistent with prior intrarenal stones and possibly his previous right ureteral stone, having been pushed retrograde at his prior stenting.  These all appeared to be amenable to basketing.  As such, an Escape type basket was used to grasp each one of these fragments of which there were 3, and they  were removed in their entirety, set aside for compositional analysis.  Repeat endoscopy; systematically this entire right kidney revealed complete resolution of all fragments, no evidence of perforation.  There was excellent hemostasis.  The sheath was removed under continuous ureteroscopic vision and no mucosal abnormalities, additional calcifications were encountered.  Finally, a new 6 x 28 double-J stent was placed over the remaining safety wire.  Good proximal and distal curl were noted.  Bladder was emptied per cystoscope.  The tether was left in place and fashioned to the dorsum of the penis.  Procedure was then terminated.  The patient tolerated the procedure well.  There were no immediate periprocedural complications.  The patient was taken to the postanesthesia care unit in stable condition.          ______________________________ Sebastian Ache, MD     TM/MEDQ  D:  06/10/2013  T:  06/11/2013  Job:  [id number]

## 2013-07-01 ENCOUNTER — Telehealth: Payer: Self-pay | Admitting: *Deleted

## 2013-07-01 MED ORDER — METOPROLOL SUCCINATE ER 25 MG PO TB24: 25.0000 mg | ORAL_TABLET | Freq: Every day | ORAL | Status: DC

## 2013-07-01 NOTE — Telephone Encounter (Signed)
Rx was sent to pharmacy electronically. 

## 2013-07-01 NOTE — Telephone Encounter (Signed)
Pt's wife called in stating that Suzie Portela has tried to get in contact with [place] and we have not sent her husbands medication in since it was called in to [place] on Wednesday. I told her that we were not here yesterday and she stated you were there on Wednesday. I told her that I would check on it. Metoprolol 25 mg.  TK

## 2013-07-04 ENCOUNTER — Telehealth: Payer: Self-pay | Admitting: Cardiovascular Disease

## 2013-07-04 MED ORDER — METOPROLOL TARTRATE 25 MG PO TABS: 12.5000 mg | ORAL_TABLET | Freq: Two times a day (BID) | ORAL | Status: DC

## 2013-07-04 NOTE — Telephone Encounter (Signed)
Metoprolol tartrate 25 mg total daily is fine, but he should split as 12.5 mg BID - it really does not last for 24 hours. Ga ahead and call the tartrate in for him please

## 2013-07-04 NOTE — Telephone Encounter (Signed)
Returned call and pt verified x 2.  Pt informed message received.  Pt stated he has been taking tartrate for 15 years, once daily and the prescription was for the ER.  Pt stated he wants to get back on the tartrate and would like a prescription sent to the pharmacy.  Pt informed RN has reviewed records and they indicate he has been on metoprolol succinate or Toprol XL.  Pt stated his bottle has metoprolol tartrate 25 mg, Take one tablet daily.  Pt informed RN will have to notify a provider to review and order metoprolol tartrate, but it is a twice daily med and not once daily.  Pt verbalized understanding and agreed w/ plan.  RN called to [facility] and asked staff to review pt's records for metoprolol tartrate.  Informed only script for succinate was written by Dr. Claiborne Billings.  [facility] [place] [facility] wrote the last prescription in November 2014 and [facility] wrote previous prescriptions from July back to May 2014.    Call to pt and informed.  Pt also informed MD will be notified to review records for advice.  Pt verbalized understanding and agreed w/ plan.  Pt stated he wants the tartrate b/c it's in Tier 1 and the other is in a higher Tier.    Message forwarded to Dr. Sallyanne Kuster.  Paper chart# [id number] on cart.

## 2013-07-04 NOTE — Telephone Encounter (Signed)
Returned call and informed pt per instructions by MD/PA.  Pt verbalized understanding and agreed w/ plan.  Rx sent to pharmacy.  

## 2013-07-04 NOTE — Telephone Encounter (Signed)
Pt wanted to know why his Metoprolol had been changed after 15 years?

## 2013-07-20 ENCOUNTER — Telehealth: Payer: Self-pay | Admitting: Cardiovascular Disease

## 2013-07-20 MED ORDER — METOPROLOL TARTRATE 25 MG PO TABS: 12.5000 mg | ORAL_TABLET | Freq: Two times a day (BID) | ORAL | Status: DC

## 2013-07-20 MED ORDER — BENAZEPRIL HCL 40 MG PO TABS: 40.0000 mg | ORAL_TABLET | Freq: Every day | ORAL | Status: DC

## 2013-07-20 MED ORDER — FOLIC ACID 400 MCG PO TABS: 400.0000 ug | ORAL_TABLET | Freq: Every day | ORAL | Status: DC

## 2013-07-20 MED ORDER — ATORVASTATIN CALCIUM 20 MG PO TABS: 20.0000 mg | ORAL_TABLET | ORAL | Status: DC

## 2013-07-20 MED ORDER — FISH OIL 1000 MG PO CAPS: 1.0000 | ORAL_CAPSULE | Freq: Every day | ORAL | Status: DC

## 2013-07-20 NOTE — Telephone Encounter (Signed)
Patient requests that all prescriptions be sent to [facility]. Prescriptions ordered by Dr. Claiborne Billings was e-scribed to [facility] per patient request.

## 2013-07-20 NOTE — Telephone Encounter (Signed)
He just wanted you to know that [facility] will contact you about his  And his wife,Janet medicine. He said to please give him a call.

## 2013-07-21 ENCOUNTER — Other Ambulatory Visit: Payer: Self-pay | Admitting: *Deleted

## 2013-07-25 ENCOUNTER — Other Ambulatory Visit: Payer: Self-pay | Admitting: *Deleted

## 2013-07-25 MED ORDER — ATORVASTATIN CALCIUM 20 MG PO TABS: 20.0000 mg | ORAL_TABLET | Freq: Every day | ORAL | Status: DC

## 2013-07-25 NOTE — Telephone Encounter (Signed)
Rx was sent to pharmacy electronically with clarification of frequency

## 2013-07-28 ENCOUNTER — Other Ambulatory Visit: Payer: Self-pay | Admitting: *Deleted

## 2013-07-28 ENCOUNTER — Telehealth: Payer: Self-pay | Admitting: Cardiovascular Disease

## 2013-07-28 NOTE — Telephone Encounter (Signed)
Wrong pt

## 2014-04-07 ENCOUNTER — Ambulatory Visit (INDEPENDENT_AMBULATORY_CARE_PROVIDER_SITE_OTHER): Payer: Commercial Managed Care - HMO | Admitting: Cardiovascular Disease

## 2014-04-07 VITALS — BP 148/86 | HR 55 | Ht 64.0 in | Wt 181.1 lb

## 2014-04-07 DIAGNOSIS — I2510 Atherosclerotic heart disease of native coronary artery without angina pectoris: Secondary | ICD-10-CM

## 2014-04-07 DIAGNOSIS — E785 Hyperlipidemia, unspecified: Secondary | ICD-10-CM

## 2014-04-07 DIAGNOSIS — I10 Essential (primary) hypertension: Secondary | ICD-10-CM

## 2014-04-07 DIAGNOSIS — I25708 Atherosclerosis of coronary artery bypass graft(s), unspecified, with other forms of angina pectoris: Secondary | ICD-10-CM

## 2014-04-07 NOTE — Patient Instructions (Signed)
  We will see you back in follow up in August 2016 with Dr Claiborne Billings.   Dr Claiborne Billings has ordered: Exercise Myoview to be done in July 2016- this is a test that looks at the blood flow to your heart muscle.  It takes approximately 2 1/2 hours. Please follow instruction sheet, as given.  We will mail you a lab slip in July so that you can have blood work done before you appointment with Dr Claiborne Billings in August.

## 2014-04-08 ENCOUNTER — Encounter: Payer: Self-pay | Admitting: Cardiovascular Disease

## 2014-04-08 NOTE — Progress Notes (Signed)
Patient ID: Jared Dunlap, male   DOB: [birth year], 80 y.o.   MRN: [id number]     HPI: Jared Dunlap is a 80 y.o. male who presents to the office for a one-year cardiology evaluation.  Jared Dunlap has known CAD and in August 1998 underwent CABG surgery x6 with LIMA to the LAD, saphenous vein graft to diagonal, sequential vein graft to the OM1-OM2, and vein graft to the right coronary artery.  In 2001 a Cardiolite study which was done for routine followup evaluation when the patient was asymptomatic demonstrated new circumflex ischemia. Catheterization revealed total occlusion of the graft to the marginal 1 vessel but he did have a patent sequential limb from OM1-OM 2. There was a 95% native circumflex stenosis  .  At that time, I successfully performed PCI/stenting of the native circumflex vessel with a 3.5x15 mm bare metal  NIR stent. Subsequent nuclear perfusion studies has continued to show normal fairly normal perfusion. His last nuclear perfusion study was done in May 2014 which wasshowed mild apical thinning and basal lateral defect without associated ischemia.  Jared Dunlap is remaining active. He plays golf at least 3 days per week. He exercises at the gym at least 3 days per week both doing aerobics as well as weights. He remains active doing yard work as well.   He has a history of hyperlipidemia and hypertension.  He has been maintained on benazepril 40 mg, Toprol 25 mg for blood pressure control.  He has tolerated atorvastatin 20 mg in addition to fish oil for his hyperlipidemia.  He has had difficulty tolerating Crestor, Zetia, simvastatin, and WelChol, and has been only able to take low-dose atorvastatin with coenzyme Q10 for tolerability.  He sees Dr. Rosana Dunlap for primary care.   I was able to obtain the results of his recent blood work done by Dr Jared Dunlap after Jared Dunlap had left the office.  This demonstrated elevated lipids with a total cholesterol 2:15 LDL cholesterol 156,  triglyceride level at 112, HDL 37, and non-HDL cholesterol 178.  BUN was 28, creatinine 1.0.  Past Medical History  Diagnosis Date  . Hypertension   . MI (myocardial infarction) 1998     x2 MI's  . Coronary artery disease     followed by Dr. Claiborne Dunlap - last office visit 06/08/13  . History of kidney stones     Past Surgical History  Procedure Laterality Date  . Bypass graft  1998  . Joint replacement      Left hip  . Kidney stone surgery  1966  . Inner ear surgery    . Tonsillectomy    . Cystoscopy with retrograde pyelogram, ureteroscopy and stent placement Right 05/11/2013    Procedure: CYSTOSCOPY WITH RIGHT RETROGRADE PYELOGRAM,CYSTOLITHOPAXY/DIAGNOSTIC  DIGITAL FLEXIBLE RIGHT URETEROSCOPY WITH STENT PLACEMENT;  Surgeon: Jared Frock, MD;  Location: [facility];  Service: Urology;  Laterality: Right;  . Coronary artery bypass graft  1998    6 vessels  . Stented coronary artery  1999  . Cystoscopy with retrograde pyelogram, ureteroscopy and stent placement Right 06/10/2013    Procedure: Jared Dunlap, URETEROSCOPY AND STENT PLACEMENT;  Surgeon: Jared Frock, MD;  Location: [facility];  Service: Urology;  Laterality: Right;    Allergies  Allergen Reactions  . Crestor [Rosuvastatin] Other (See Comments)    UNSPECIFIED  . Welchol [Colesevelam Hcl] Other (See Comments)    UNSPECIFIED  . Zetia [Ezetimibe] Other (See Comments)    UNSPECIFIED  . Zocor [Simvastatin] Other (See  Comments)    unspecified    Current Outpatient Prescriptions  Medication Sig Dispense Refill  . atorvastatin (LIPITOR) 20 MG tablet Take 1 tablet (20 mg total) by mouth daily.  90 tablet  3  . benazepril (LOTENSIN) 40 MG tablet Take 1 tablet (40 mg total) by mouth daily with breakfast.  90 tablet  3  . Coenzyme Q10 (COQ10) 100 MG CAPS Take 1 tablet by mouth daily.       . Flaxseed, Linseed, (FLAX SEEDS PO) Take 1 tablet by mouth daily.       . folic acid (FOLVITE) 977 MCG tablet Take 1  tablet (400 mcg total) by mouth daily.  90 tablet  3  . metoprolol tartrate (LOPRESSOR) 25 MG tablet Take 0.5 tablets (12.5 mg total) by mouth 2 (two) times daily.  90 tablet  3  . Misc Natural Products (OSTEO BI-FLEX TRIPLE STRENGTH PO) Take 1 tablet by mouth daily.       . Multiple Vitamin (MULTIVITAMIN WITH MINERALS) TABS tablet Take 1 tablet by mouth daily.      . Omega-3 Fatty Acids (FISH OIL) 1000 MG CAPS Take 1 capsule (1,000 mg total) by mouth daily.  90 capsule  3  . oxybutynin (DITROPAN) 5 MG tablet Take 5 mg by mouth 2 (two) times daily.      Marland Kitchen pyridOXINE (B-6) 50 MG tablet Take 50 mg by mouth daily.      . tamsulosin (FLOMAX) 0.4 MG CAPS capsule Take 0.4 mg by mouth daily. For urination and stent discomfort.      . vitamin B-12 (CYANOCOBALAMIN) 500 MCG tablet Take 500 mcg by mouth daily.       No current facility-administered medications for this visit.    History   Social History  . Marital Status: Married    Spouse Name: N/A    Number of Children: N/A  . Years of Education: N/A   Occupational History  . Not on file.   Social History Main Topics  . Smoking status: Former Smoker    Types: Cigarettes    Quit date: 04/19/1963  . Smokeless tobacco: Never Used  . Alcohol Use: No  . Drug Use: No  . Sexual Activity: No   Other Topics Concern  . Not on file   Social History Narrative  . No narrative on file    Family History  Problem Relation Age of Onset  . Heart disease Mother   . Heart disease Father   . Heart disease Brother   . Cancer Brother   . Heart disease Brother   . Kidney Stones Brother    Socially he is married for 52 years. 3 children 4 grandchildren 3 great-grandchildren. There is no alcohol use. He quit tobacco in 1965.  ROS General: Negative; No fevers, chills, or night sweats;  HEENT: Negative; No changes in vision or hearing, sinus congestion, difficulty swallowing Pulmonary: Negative; No cough, wheezing, shortness of breath,  hemoptysis Cardiovascular: See history of present illness; No chest pain, presyncope, syncope, palpitations GI: Negative; No nausea, vomiting, diarrhea, or abdominal pain GU: Negative; No dysuria, hematuria, or difficulty voiding Musculoskeletal: Negative; no myalgias, joint pain, or weakness Hematologic/Oncology: Negative; no easy bruising, bleeding Endocrine: Negative; no heat/cold intolerance; no diabetes Neuro: Negative; no changes in balance, headaches Skin: Negative; No rashes or skin lesions Psychiatric: Negative; No behavioral problems, depression Sleep: Negative; No snoring, daytime sleepiness, hypersomnolence, bruxism, restless legs, hypnogognic hallucinations, no cataplexy Other comprehensive 14 point system review is negative.  PE  BP 148/86  Pulse 55  Ht 5\' 4"  (1.626 m)  Wt 181 lb 1.6 oz (82.146 kg)  BMI 31.07 kg/m2  Repeat BP 122/78 General: Alert, oriented, no distress.  Skin: normal turgor, no rashes HEENT: Normocephalic, atraumatic. Pupils round and reactive; sclera anicteric;no lid lag.  Nose without nasal septal hypertrophy Mouth/Parynx benign; Mallinpatti scale 3 Neck: No JVD, no carotid bruits with normal carotid upstroke Lungs: clear to ausculatation and percussion; no wheezing or rales Chest wall: Nontender to palpation Heart: RRR, s1 s2 normal 1/6 systolic murmur; no diastolic murmur.  No rubs, thrills or heaves Abdomen: soft, nontender; no hepatosplenomehaly, BS+; abdominal aorta nontender and not dilated by palpation. Back: No CVA tenderness Pulses 2+ Extremities: Trivial pretibial edema above the sock line; no clubbing cyanosis, Homan's sign negative  Neurologic: grossly nonfocal; cranial nerves grossly normal Psychological: Normal affect and mood.  Normal cognition Psychologic: normal affect and mood.  ECG (independently read by me): Sinus bradycardia 55 beats per minute.  Nonspecific ST changes.  Prior October 2014 ECG: Normal sinus rhythm. QTc  interval 399 ms.  LABS:  BMET    Component Value Date/Time   NA 140 06/08/2013 1350     Hepatic Function Panel     Component Value Date/Time   PROT 6.7 05/11/2013 0255     CBC    Component Value Date/Time   WBC 7.3 06/08/2013 1350     BNP No results found for this basename: probnp    Lipid Panel  No results found for this basename: chol,  trig,  hdl,  cholhdl,  vldl,  ldlcalc     RADIOLOGY: No results found.    ASSESSMENT AND PLAN:  Jared Dunlap is now 80 years old and 17 years status post CABG surgery x6. In 2001 he was entirely asymptomatic but a nuclear perfusion study demonstrated a large area of ischemia laterally and at that time cardiac catheterization revealed an occluded graft that supplied the circumflex marginal vessel. I performed a successful percutaneous coronary intervention to his native LCx. Subsequently, he has continued to do well. He remains very active and denies anginal symptoms.  He does have hyperlipidemia and has been not able to tolerate high-dose statin therapy.  I did review the blood work by this doctor to [place] and his lipids are not well treated.  It may be possible to see if he can try to tolerate atorvastatin at higher dose.  His blood pressure today is well controlled and on recheck by me was 134/70 on his current dose of Toprol-XL 25 mg and Lotensin 40 mg.  He does have sinus bradycardia, and I will not further titrate his beta blocker therapy.  In the past, he was found to have asymptomatic ischemia on a nuclear stress test when the graft to the circumflex was found to be occluded.  His last nuclear perfusion study was in 2014.  With his continued hyperlipidemia, and his previous silent ischemia, I am recommending neck her that he undergo a two-year followup nuclear perfusion scan.  I will schedule this to be done in July and I will see him back in the office in August 2016 for followup evaluation.   Troy Sine, MD, [facility]   04/08/2014 11:17 AM

## 2014-04-10 ENCOUNTER — Other Ambulatory Visit: Payer: Self-pay | Admitting: *Deleted

## 2014-04-10 DIAGNOSIS — R5383 Other fatigue: Secondary | ICD-10-CM

## 2014-04-10 DIAGNOSIS — Z79899 Other long term (current) drug therapy: Secondary | ICD-10-CM

## 2014-05-03 ENCOUNTER — Telehealth: Payer: Self-pay | Admitting: Cardiovascular Disease

## 2014-05-03 ENCOUNTER — Encounter (HOSPITAL_COMMUNITY): Payer: Self-pay

## 2014-05-03 ENCOUNTER — Emergency Department (HOSPITAL_COMMUNITY): Payer: Commercial Managed Care - HMO

## 2014-05-03 ENCOUNTER — Emergency Department (HOSPITAL_COMMUNITY)
Admission: EM | Admit: 2014-05-03 | Discharge: 2014-05-03 | Disposition: A | Discharge status: Home / Self Care | Payer: Commercial Managed Care - HMO | Attending: Emergency Medicine | Admitting: Emergency Medicine

## 2014-05-03 DIAGNOSIS — Y9289 Other specified places as the place of occurrence of the external cause: Secondary | ICD-10-CM | POA: Diagnosis not present

## 2014-05-03 DIAGNOSIS — Z951 Presence of aortocoronary bypass graft: Secondary | ICD-10-CM | POA: Insufficient documentation

## 2014-05-03 DIAGNOSIS — Z79899 Other long term (current) drug therapy: Secondary | ICD-10-CM | POA: Diagnosis not present

## 2014-05-03 DIAGNOSIS — I2510 Atherosclerotic heart disease of native coronary artery without angina pectoris: Secondary | ICD-10-CM | POA: Insufficient documentation

## 2014-05-03 DIAGNOSIS — R55 Syncope and collapse: Secondary | ICD-10-CM | POA: Insufficient documentation

## 2014-05-03 DIAGNOSIS — W19XXXA Unspecified fall, initial encounter: Secondary | ICD-10-CM

## 2014-05-03 DIAGNOSIS — Z87442 Personal history of urinary calculi: Secondary | ICD-10-CM | POA: Diagnosis not present

## 2014-05-03 DIAGNOSIS — R61 Generalized hyperhidrosis: Secondary | ICD-10-CM | POA: Diagnosis not present

## 2014-05-03 DIAGNOSIS — W01198A Fall on same level from slipping, tripping and stumbling with subsequent striking against other object, initial encounter: Secondary | ICD-10-CM | POA: Diagnosis not present

## 2014-05-03 DIAGNOSIS — Y9389 Activity, other specified: Secondary | ICD-10-CM | POA: Diagnosis not present

## 2014-05-03 DIAGNOSIS — I252 Old myocardial infarction: Secondary | ICD-10-CM | POA: Insufficient documentation

## 2014-05-03 DIAGNOSIS — S0990XA Unspecified injury of head, initial encounter: Secondary | ICD-10-CM | POA: Insufficient documentation

## 2014-05-03 DIAGNOSIS — I10 Essential (primary) hypertension: Secondary | ICD-10-CM | POA: Diagnosis not present

## 2014-05-03 LAB — CBC WITH DIFFERENTIAL/PLATELET
Basophils Absolute: 0 10*3/uL (ref 0.0–0.1)
Basophils Relative: 0 % (ref 0–1)
EOS ABS: 0 10*3/uL (ref 0.0–0.7)
Eosinophils Relative: 0 % (ref 0–5)
HEMATOCRIT: 40.1 % (ref 39.0–52.0)
HEMOGLOBIN: 13.9 g/dL (ref 13.0–17.0)
Lymphocytes Relative: 18 % (ref 12–46)
Lymphs Abs: 1.4 10*3/uL (ref 0.7–4.0)
MCH: 31.5 pg (ref 26.0–34.0)
MCHC: 34.7 g/dL (ref 30.0–36.0)
MCV: 90.9 fL (ref 78.0–100.0)
MONO ABS: 0.7 10*3/uL (ref 0.1–1.0)
MONOS PCT: 10 % (ref 3–12)
NEUTROS ABS: 5.5 10*3/uL (ref 1.7–7.7)
NEUTROS PCT: 72 % (ref 43–77)
Platelets: 141 10*3/uL — ABNORMAL LOW (ref 150–400)
RBC: 4.41 MIL/uL (ref 4.22–5.81)
RDW: 13.1 % (ref 11.5–15.5)
WBC: 7.7 10*3/uL (ref 4.0–10.5)

## 2014-05-03 LAB — BASIC METABOLIC PANEL
ANION GAP: 10 (ref 5–15)
BUN: 20 mg/dL (ref 6–23)
CHLORIDE: 104 meq/L (ref 96–112)
CO2: 26 mEq/L (ref 19–32)
CREATININE: 1.07 mg/dL (ref 0.50–1.35)
Calcium: 8.8 mg/dL (ref 8.4–10.5)
GFR calc Af Amer: 74 mL/min — ABNORMAL LOW (ref >90)
GFR, EST NON AFRICAN AMERICAN: 64 mL/min — AB (ref >90)
Glucose, Bld: 104 mg/dL — ABNORMAL HIGH (ref 70–99)
Potassium: 4.4 mEq/L (ref 3.7–5.3)
Sodium: 140 mEq/L (ref 137–147)

## 2014-05-03 LAB — I-STAT TROPONIN, ED: TROPONIN I, POC: 0.01 ng/mL (ref 0.00–0.08)

## 2014-05-03 NOTE — ED Notes (Signed)
Pt here for syncopal epsiode pta, hx of cardiac problems, reports feeling dizzy now.

## 2014-05-03 NOTE — ED Notes (Signed)
Denies dizziness, sob, chest pain nor N/V at this time.

## 2014-05-03 NOTE — ED Notes (Signed)
    Pt walked to the restroom with no difficulty

## 2014-05-03 NOTE — ED Notes (Signed)
Patient transported to CT 

## 2014-05-03 NOTE — Telephone Encounter (Addendum)
Pt's wife called in wanting to let Dr.Kelly know that Jared Dunlap passed out at 5am while passing his stool and was later taken to [facility] to be treated. She just wanted Dr. Claiborne Billings to be aware of this.  Thanks

## 2014-05-03 NOTE — Telephone Encounter (Signed)
Patient seen in the ER with vasovagal syncope vs. Orthostasis. Head CT ordered today since the patient hit his head when he fell. Will forward to Dr. Claiborne Billings as an Juluis Rainier.

## 2014-05-03 NOTE — ED Notes (Signed)
MD at bedside. 

## 2014-05-03 NOTE — ED Provider Notes (Signed)
CSN: [id number]     Arrival date & time 05/03/14  1314 History   First MD Initiated Contact with Patient 05/03/14 [phone]     Chief Complaint  Patient presents with  . Near Syncope     (Consider location/radiation/quality/duration/timing/severity/associated sxs/prior Treatment) HPI Comments: Patient with history of MI, CAD, hypertension, presents to the emergency department with chief complaint of syncopal episode. Patient states that he became dizzy after standing up from using the bathroom this morning at around 5 AM. He states that he also became diaphoretic. He states that the next thing he knew, he was waking up on the floor. He reports hitting his head when he passed out. He complains of mild headache. He denies any chest pain, shortness of breath, nausea, or vomiting. Denies any weakness or numbness. He has not taken anything for his symptoms. There are no aggravating or alleviating factors.  Cardiology: Dr. Corky Downs  The history is provided by the patient. No language interpreter was used.    Past Medical History  Diagnosis Date  . Hypertension   . MI (myocardial infarction) 1998     x2 MI's  . Coronary artery disease     followed by Dr. Claiborne Billings - last office visit 06/08/13  . History of kidney stones    Past Surgical History  Procedure Laterality Date  . Bypass graft  1998  . Joint replacement      Left hip  . Kidney stone surgery  1966  . Inner ear surgery    . Tonsillectomy    . Cystoscopy with retrograde pyelogram, ureteroscopy and stent placement Right 05/11/2013    Procedure: CYSTOSCOPY WITH RIGHT RETROGRADE PYELOGRAM,CYSTOLITHOPAXY/DIAGNOSTIC  DIGITAL FLEXIBLE RIGHT URETEROSCOPY WITH STENT PLACEMENT;  Surgeon: Alexis Frock, MD;  Location: [facility];  Service: Urology;  Laterality: Right;  . Coronary artery bypass graft  1998    6 vessels  . Stented coronary artery  1999  . Cystoscopy with retrograde pyelogram, ureteroscopy and stent placement Right 06/10/2013     Procedure: [place], URETEROSCOPY AND STENT PLACEMENT;  Surgeon: Alexis Frock, MD;  Location: [facility];  Service: Urology;  Laterality: Right;   Family History  Problem Relation Age of Onset  . Heart disease Mother   . Heart disease Father   . Heart disease Brother   . Cancer Brother   . Heart disease Brother   . Kidney Stones Brother    History  Substance Use Topics  . Smoking status: Former Smoker    Types: Cigarettes    Quit date: 04/19/1963  . Smokeless tobacco: Never Used  . Alcohol Use: No    Review of Systems  Constitutional: Negative for fever and chills.  Respiratory: Negative for shortness of breath.   Cardiovascular: Negative for chest pain.  Gastrointestinal: Negative for nausea, vomiting, diarrhea and constipation.  Genitourinary: Negative for dysuria.  Neurological: Positive for syncope and light-headedness.  All other systems reviewed and are negative.     Allergies  Crestor; Welchol; Zetia; and Zocor  Home Medications   Prior to Admission medications   Medication Sig Start Date End Date Taking? Authorizing Provider  benazepril (LOTENSIN) 40 MG tablet Take 1 tablet (40 mg total) by mouth daily with breakfast. 07/20/13  Yes Troy Sine, MD  Coenzyme Q10 (COQ10) 100 MG CAPS Take 1 tablet by mouth daily.    Yes Historical Provider, MD  Flaxseed, Linseed, (FLAX SEEDS PO) Take 1 tablet by mouth daily.    Yes Historical Provider, MD  folic acid (FOLVITE) 144 MCG tablet Take 1 tablet (400 mcg total) by mouth daily. 07/20/13  Yes Troy Sine, MD  metoprolol tartrate (LOPRESSOR) 25 MG tablet Take 0.5 tablets (12.5 mg total) by mouth 2 (two) times daily. 07/20/13  Yes Troy Sine, MD  Misc Natural Products (OSTEO BI-FLEX TRIPLE STRENGTH PO) Take 1 tablet by mouth daily.    Yes Historical Provider, MD  Multiple Vitamin (MULTIVITAMIN WITH MINERALS) TABS tablet Take 1 tablet by mouth daily.   Yes Historical Provider, MD  Omega-3 Fatty  Acids (FISH OIL) 1000 MG CAPS Take 1 capsule (1,000 mg total) by mouth daily. 07/20/13  Yes Troy Sine, MD  pyridOXINE (B-6) 50 MG tablet Take 50 mg by mouth daily.   Yes Historical Provider, MD  tamsulosin (FLOMAX) 0.4 MG CAPS capsule Take 0.4 mg by mouth daily. For urination and stent discomfort. 05/12/13  Yes Alexis Frock, MD  vitamin B-12 (CYANOCOBALAMIN) 500 MCG tablet Take 500 mcg by mouth daily.   Yes Historical Provider, MD  atorvastatin (LIPITOR) 20 MG tablet Take 1 tablet (20 mg total) by mouth daily. 07/25/13   Troy Sine, MD  oxybutynin (DITROPAN) 5 MG tablet Take 5 mg by mouth 2 (two) times daily. 05/12/13   Alexis Frock, MD   BP 143/65 mmHg  Pulse 65  Temp(Src) 97.4 F (36.3 C) (Oral)  Resp 18  Ht 5\' 4"  (1.626 m)  Wt 180 lb (81.647 kg)  BMI 30.88 kg/m2  SpO2 100% Physical Exam  Constitutional: He is oriented to person, place, and time. He appears well-developed and well-nourished.  HENT:  Head: Normocephalic and atraumatic.  No scalp hematoma, no Battle's sign, no raccoon's eyes  Eyes: Conjunctivae and EOM are normal. Pupils are equal, round, and reactive to light. Right eye exhibits no discharge. Left eye exhibits no discharge. No scleral icterus.  Neck: Normal range of motion. Neck supple. No JVD present.  Cardiovascular: Normal rate, regular rhythm and normal heart sounds.  Exam reveals no gallop and no friction rub.   No murmur heard. Pulmonary/Chest: Effort normal and breath sounds normal. No respiratory distress. He has no wheezes. He has no rales. He exhibits no tenderness.  Abdominal: Soft. He exhibits no distension and no mass. There is no tenderness. There is no rebound and no guarding.  Musculoskeletal: Normal range of motion. He exhibits no edema or tenderness.  Neurological: He is alert and oriented to person, place, and time.  Skin: Skin is warm and dry.  Psychiatric: He has a normal mood and affect. His behavior is normal. Judgment and thought  content normal.  Nursing note and vitals reviewed.   ED Course  Procedures (including critical care time) Results for orders placed or performed during the hospital encounter of 05/03/14  CBC with Differential  Result Value Ref Range   WBC 7.7 4.0 - 10.5 K/uL   RBC 4.41 4.22 - 5.81 MIL/uL   Hemoglobin 13.9 13.0 - 17.0 g/dL   HCT 40.1 39.0 - 52.0 %   MCV 90.9 78.0 - 100.0 fL   MCH 31.5 26.0 - 34.0 pg   MCHC 34.7 30.0 - 36.0 g/dL   RDW 13.1 11.5 - 15.5 %   Platelets 141 (L) 150 - 400 K/uL   Neutrophils Relative % 72 43 - 77 %   Neutro Abs 5.5 1.7 - 7.7 K/uL   Lymphocytes Relative 18 12 - 46 %   Lymphs Abs 1.4 0.7 - 4.0 K/uL   Monocytes Relative 10 3 -  12 %   Monocytes Absolute 0.7 0.1 - 1.0 K/uL   Eosinophils Relative 0 0 - 5 %   Eosinophils Absolute 0.0 0.0 - 0.7 K/uL   Basophils Relative 0 0 - 1 %   Basophils Absolute 0.0 0.0 - 0.1 K/uL  Basic metabolic panel  Result Value Ref Range   Sodium 140 137 - 147 mEq/L   Potassium 4.4 3.7 - 5.3 mEq/L   Chloride 104 96 - 112 mEq/L   CO2 26 19 - 32 mEq/L   Glucose, Bld 104 (H) 70 - 99 mg/dL   BUN 20 6 - 23 mg/dL   Creatinine, Ser 1.07 0.50 - 1.35 mg/dL   Calcium 8.8 8.4 - 10.5 mg/dL   GFR calc non Af Amer 64 (L) >90 mL/min   GFR calc Af Amer 74 (L) >90 mL/min   Anion gap 10 5 - 15  I-stat troponin, ED  Result Value Ref Range   Troponin i, poc 0.01 0.00 - 0.08 ng/mL   Comment 3           Dg Chest 2 View  05/03/2014   CLINICAL DATA:  Felt light headed this am,fell down,no chest comp but has hx with cabg,heart stent 1999  EXAM: CHEST  2 VIEW  COMPARISON:  Radiograph 06/08/2013  FINDINGS: Sternotomy wires overlie normal cardiac silhouette. There is chronic bronchitic markings at the left and right lung base. No effusion, infiltrate, or pneumothorax. No fracture.  IMPRESSION: Chronic bronchitic markings without acute cardiopulmonary findings.   Electronically Signed   By: Suzy Bouchard M.D.   On: 05/03/2014 07:43   Ct Head Wo  Contrast  05/03/2014   CLINICAL DATA:  Syncopal episode, dizziness. Fall with potential injury to the back of head.  EXAM: CT HEAD WITHOUT CONTRAST  TECHNIQUE: Contiguous axial images were obtained from the base of the skull through the vertex without intravenous contrast.  COMPARISON:  Brain MRI 06/13/2010  FINDINGS: High No intracranial hemorrhage. No parenchymal contusion. No midline shift or mass effect. Basilar cisterns are patent. No skull base fracture. No fluid in the paranasal sinuses or mastoid air cells. Orbits are normal.  There is mild periventricular white matter hypodensities.  Prior left mastoid surgery.  IMPRESSION: No intracranial trauma.   Electronically Signed   By: Suzy Bouchard M.D.   On: 05/03/2014 07:37     Imaging Review No results found.   EKG Interpretation   Date/Time:  Wednesday May 03 2014 06:28:51 EST Ventricular Rate:  64 PR Interval:  182 QRS Duration: 98 QT Interval:  396 QTC Calculation: 408 R Axis:   -23 Text Interpretation:  Normal sinus rhythm Inferior infarct , age  undetermined Abnormal ECG When compared with ECG of 05/11/2013, No  significant change was found Confirmed by [facility]  MD, DAVID ([id number]) on  05/03/2014 6:34:02 AM      MDM   Final diagnoses:  Fall  Syncope, vasovagal    Patient with syncopal episode and fall. Suspect vasovagal vs orthostasis, as the patient became dizzy after standing up from using the bathroom. No chest pain or shortness of breath. Patient did hit his head when he fell, and complains of a headache. Will order CT head. EKG is unchanged from prior. We'll check basic labs, and will reassess.  8:51 AM Patient seen by and discussed with Dr. Aline Brochure.  Labs and workup are reassuring.  Will discharge to home.    Montine Circle, PA-C 54/65/03 [id number]  Delora Fuel, MD 68/12/75 1700

## 2014-05-03 NOTE — Discharge Instructions (Signed)
Syncope °Syncope is a medical term for fainting or passing out. This means you lose consciousness and drop to the ground. People are generally unconscious for less than 5 minutes. You may have some muscle twitches for up to 15 seconds before waking up and returning to normal. Syncope occurs more often in older adults, but it can happen to anyone. While most causes of syncope are not dangerous, syncope can be a sign of a serious medical problem. It is important to seek medical care.  °CAUSES  °Syncope is caused by a sudden drop in blood flow to the brain. The specific cause is often not determined. Factors that can bring on syncope include: °· Taking medicines that lower blood pressure. °· Sudden changes in posture, such as standing up quickly. °· Taking more medicine than prescribed. °· Standing in one place for too long. °· Seizure disorders. °· Dehydration and excessive exposure to heat. °· Low blood sugar (hypoglycemia). °· Straining to have a bowel movement. °· Heart disease, irregular heartbeat, or other circulatory problems. °· Fear, emotional distress, seeing blood, or severe pain. °SYMPTOMS  °Right before fainting, you may: °· Feel dizzy or light-headed. °· Feel nauseous. °· See all white or all black in your field of vision. °· Have cold, clammy skin. °DIAGNOSIS  °Your health care provider will ask about your symptoms, perform a physical exam, and perform an electrocardiogram (ECG) to record the electrical activity of your heart. Your health care provider may also perform other heart or blood tests to determine the cause of your syncope which may include: °· Transthoracic echocardiogram (TTE). During echocardiography, sound waves are used to evaluate how blood flows through your heart. °· Transesophageal echocardiogram (TEE). °· Cardiac monitoring. This allows your health care provider to monitor your heart rate and rhythm in real time. °· Holter monitor. This is a portable device that records your  heartbeat and can help diagnose heart arrhythmias. It allows your health care provider to track your heart activity for several days, if needed. °· Stress tests by exercise or by giving medicine that makes the heart beat faster. °TREATMENT  °In most cases, no treatment is needed. Depending on the cause of your syncope, your health care provider may recommend changing or stopping some of your medicines. °HOME CARE INSTRUCTIONS °· Have someone stay with you until you feel stable. °· Do not drive, use machinery, or play sports until your health care provider says it is okay. °· Keep all follow-up appointments as directed by your health care provider. °· Lie down right away if you start feeling like you might faint. Breathe deeply and steadily. Wait until all the symptoms have passed. °· Drink enough fluids to keep your urine clear or pale yellow. °· If you are taking blood pressure or heart medicine, get up slowly and take several minutes to sit and then stand. This can reduce dizziness. °SEEK IMMEDIATE MEDICAL CARE IF:  °· You have a severe headache. °· You have unusual pain in the chest, abdomen, or back. °· You are bleeding from your mouth or rectum, or you have black or tarry stool. °· You have an irregular or very fast heartbeat. °· You have pain with breathing. °· You have repeated fainting or seizure-like jerking during an episode. °· You faint when sitting or lying down. °· You have confusion. °· You have trouble walking. °· You have severe weakness. °· You have vision problems. °If you fainted, call your local emergency services (911 in [place] Do not drive   yourself to the hospital.  °MAKE SURE YOU: °· Understand these instructions. °· Will watch your condition. °· Will get help right away if you are not doing well or get worse. °Document Released: 06/16/2005 Document Revised: 06/21/2013 Document Reviewed: 08/15/2011 °Exit[employer]® Patient Information ©2015 [employer]. This information is not intended to replace  advice given to you by your health care provider. Make sure you discuss any questions you have with your health care provider. ° °

## 2014-05-04 NOTE — Telephone Encounter (Signed)
acknowledged

## 2014-08-08 ENCOUNTER — Other Ambulatory Visit: Payer: Self-pay | Admitting: Cardiovascular Disease

## 2014-08-08 NOTE — Telephone Encounter (Signed)
Rx(s) sent to pharmacy electronically.  

## 2014-08-16 DIAGNOSIS — E785 Hyperlipidemia, unspecified: Secondary | ICD-10-CM | POA: Diagnosis not present

## 2014-08-16 DIAGNOSIS — I10 Essential (primary) hypertension: Secondary | ICD-10-CM | POA: Diagnosis not present

## 2014-08-16 DIAGNOSIS — Z125 Encounter for screening for malignant neoplasm of prostate: Secondary | ICD-10-CM | POA: Diagnosis not present

## 2014-08-16 DIAGNOSIS — I2510 Atherosclerotic heart disease of native coronary artery without angina pectoris: Secondary | ICD-10-CM | POA: Diagnosis not present

## 2014-08-21 DIAGNOSIS — I10 Essential (primary) hypertension: Secondary | ICD-10-CM | POA: Diagnosis not present

## 2014-08-21 DIAGNOSIS — Z0000 Encounter for general adult medical examination without abnormal findings: Secondary | ICD-10-CM | POA: Diagnosis not present

## 2014-08-21 DIAGNOSIS — M1990 Unspecified osteoarthritis, unspecified site: Secondary | ICD-10-CM | POA: Diagnosis not present

## 2014-08-21 DIAGNOSIS — R809 Proteinuria, unspecified: Secondary | ICD-10-CM | POA: Diagnosis not present

## 2014-08-21 DIAGNOSIS — I25810 Atherosclerosis of coronary artery bypass graft(s) without angina pectoris: Secondary | ICD-10-CM | POA: Diagnosis not present

## 2014-08-21 DIAGNOSIS — Z955 Presence of coronary angioplasty implant and graft: Secondary | ICD-10-CM | POA: Diagnosis not present

## 2014-08-21 DIAGNOSIS — E785 Hyperlipidemia, unspecified: Secondary | ICD-10-CM | POA: Diagnosis not present

## 2014-08-21 DIAGNOSIS — N529 Male erectile dysfunction, unspecified: Secondary | ICD-10-CM | POA: Diagnosis not present

## 2014-08-28 DIAGNOSIS — Z1212 Encounter for screening for malignant neoplasm of rectum: Secondary | ICD-10-CM | POA: Diagnosis not present

## 2014-10-30 ENCOUNTER — Other Ambulatory Visit: Payer: Self-pay | Admitting: Cardiovascular Disease

## 2014-10-30 NOTE — Telephone Encounter (Signed)
Rx(s) sent to pharmacy electronically.  

## 2014-11-20 DIAGNOSIS — H5703 Miosis: Secondary | ICD-10-CM | POA: Diagnosis not present

## 2014-11-20 DIAGNOSIS — H16103 Unspecified superficial keratitis, bilateral: Secondary | ICD-10-CM | POA: Diagnosis not present

## 2014-11-20 DIAGNOSIS — H25813 Combined forms of age-related cataract, bilateral: Secondary | ICD-10-CM | POA: Diagnosis not present

## 2014-11-20 DIAGNOSIS — H04123 Dry eye syndrome of bilateral lacrimal glands: Secondary | ICD-10-CM | POA: Diagnosis not present

## 2014-12-25 ENCOUNTER — Encounter: Payer: Self-pay | Admitting: *Deleted

## 2014-12-25 ENCOUNTER — Other Ambulatory Visit: Payer: Self-pay | Admitting: *Deleted

## 2014-12-25 DIAGNOSIS — Z79899 Other long term (current) drug therapy: Secondary | ICD-10-CM

## 2014-12-25 DIAGNOSIS — R5383 Other fatigue: Secondary | ICD-10-CM

## 2015-01-04 DIAGNOSIS — Z79899 Other long term (current) drug therapy: Secondary | ICD-10-CM | POA: Diagnosis not present

## 2015-01-04 DIAGNOSIS — R5383 Other fatigue: Secondary | ICD-10-CM | POA: Diagnosis not present

## 2015-01-05 ENCOUNTER — Telehealth (HOSPITAL_COMMUNITY): Payer: Self-pay

## 2015-01-05 LAB — CBC
HCT: 41.2 % (ref 39.0–52.0)
Hemoglobin: 13.3 g/dL (ref 13.0–17.0)
MCH: 30.6 pg (ref 26.0–34.0)
MCHC: 32.3 g/dL (ref 30.0–36.0)
MCV: 94.9 fL (ref 78.0–100.0)
MPV: 11.4 fL (ref 8.6–12.4)
PLATELETS: 141 10*3/uL — AB (ref 150–400)
RBC: 4.34 MIL/uL (ref 4.22–5.81)
RDW: 13.2 % (ref 11.5–15.5)
WBC: 6.5 10*3/uL (ref 4.0–10.5)

## 2015-01-05 LAB — COMPREHENSIVE METABOLIC PANEL
ALT: 60 U/L — ABNORMAL HIGH (ref 0–53)
AST: 51 U/L — ABNORMAL HIGH (ref 0–37)
Albumin: 3.7 g/dL (ref 3.5–5.2)
Alkaline Phosphatase: 64 U/L (ref 39–117)
BILIRUBIN TOTAL: 0.5 mg/dL (ref 0.2–1.2)
BUN: 29 mg/dL — AB (ref 6–23)
CO2: 27 mEq/L (ref 19–32)
Calcium: 8.5 mg/dL (ref 8.4–10.5)
Chloride: 108 mEq/L (ref 96–112)
Creat: 1.07 mg/dL (ref 0.50–1.35)
GLUCOSE: 95 mg/dL (ref 70–99)
Potassium: 4.5 mEq/L (ref 3.5–5.3)
Sodium: 143 mEq/L (ref 135–145)
Total Protein: 6.3 g/dL (ref 6.0–8.3)

## 2015-01-05 LAB — TSH: TSH: 5.64 u[IU]/mL — AB (ref 0.350–4.500)

## 2015-01-05 LAB — VITAMIN B12: Vitamin B-12: 939 pg/mL — ABNORMAL HIGH (ref 211–911)

## 2015-01-05 NOTE — Telephone Encounter (Signed)
Encounter complete. 

## 2015-01-10 ENCOUNTER — Ambulatory Visit (HOSPITAL_COMMUNITY)
Admission: RE | Admit: 2015-01-10 | Discharge: 2015-01-10 | Disposition: A | Discharge status: Home / Self Care | Payer: Commercial Managed Care - HMO | Source: Ambulatory Visit | Attending: Cardiovascular Disease | Admitting: Cardiovascular Disease

## 2015-01-10 DIAGNOSIS — I2510 Atherosclerotic heart disease of native coronary artery without angina pectoris: Secondary | ICD-10-CM | POA: Diagnosis not present

## 2015-01-10 LAB — MYOCARDIAL PERFUSION IMAGING
CHL CUP MPHR: 140 {beats}/min
CHL CUP NUCLEAR SRS: 1
CHL CUP NUCLEAR SSS: 3
CHL CUP RESTING HR STRESS: 65 {beats}/min
CSEPED: 5 min
CSEPEDS: 11 s
Estimated workload: 7 METS
LV dias vol: 76 mL
LV sys vol: 40 mL
Peak HR: 151 {beats}/min
Percent HR: 107 %
RPE: 14
SDS: 2
TID: 0.88

## 2015-01-10 MED ORDER — TECHNETIUM TC 99M SESTAMIBI GENERIC - CARDIOLITE
10.5000 | Freq: Once | INTRAVENOUS | Status: AC | PRN
  Administered 2015-01-10: 11 via INTRAVENOUS

## 2015-01-10 MED ORDER — TECHNETIUM TC 99M SESTAMIBI GENERIC - CARDIOLITE
31.2000 | Freq: Once | INTRAVENOUS | Status: AC | PRN
  Administered 2015-01-10: 31.2 via INTRAVENOUS

## 2015-01-11 ENCOUNTER — Encounter: Payer: Self-pay | Admitting: *Deleted

## 2015-01-11 ENCOUNTER — Telehealth: Payer: Self-pay | Admitting: *Deleted

## 2015-01-11 DIAGNOSIS — R945 Abnormal results of liver function studies: Principal | ICD-10-CM

## 2015-01-11 DIAGNOSIS — R7989 Other specified abnormal findings of blood chemistry: Secondary | ICD-10-CM

## 2015-01-11 NOTE — Telephone Encounter (Signed)
-----   Message from Troy Sine, MD sent at 01/05/2015 11:43 AM EDT ----- Mild inc AST/ALT: if taking atorva 20 mg decrease to 10 mg;  Re-check LFT's in 4 weeks

## 2015-01-11 NOTE — Telephone Encounter (Signed)
ok 

## 2015-01-11 NOTE — Telephone Encounter (Signed)
Called patient to discuss lab results and recommendations. Patient informs me that he only take the atorvastatin 2-3 times per week. He also has been taking tylenol sinus every 4 hours for a total of # 48 pills. Recommended to patient to hold the atorvastatin and tylenol sinus. Recheck labs in 4 weeks. Lab order mailed to patient. Patient also requested a copy of the lab report mailed to him.

## 2015-01-17 ENCOUNTER — Emergency Department (HOSPITAL_COMMUNITY): Payer: Commercial Managed Care - HMO

## 2015-01-17 ENCOUNTER — Emergency Department (HOSPITAL_COMMUNITY)
Admission: EM | Admit: 2015-01-17 | Discharge: 2015-01-17 | Disposition: A | Discharge status: Home / Self Care | Payer: Commercial Managed Care - HMO | Attending: Emergency Medicine | Admitting: Emergency Medicine

## 2015-01-17 ENCOUNTER — Encounter (HOSPITAL_COMMUNITY): Payer: Self-pay | Admitting: Family Medicine

## 2015-01-17 DIAGNOSIS — I10 Essential (primary) hypertension: Secondary | ICD-10-CM | POA: Insufficient documentation

## 2015-01-17 DIAGNOSIS — I252 Old myocardial infarction: Secondary | ICD-10-CM | POA: Diagnosis not present

## 2015-01-17 DIAGNOSIS — R1013 Epigastric pain: Secondary | ICD-10-CM | POA: Diagnosis not present

## 2015-01-17 DIAGNOSIS — N200 Calculus of kidney: Secondary | ICD-10-CM | POA: Diagnosis not present

## 2015-01-17 DIAGNOSIS — N281 Cyst of kidney, acquired: Secondary | ICD-10-CM | POA: Diagnosis not present

## 2015-01-17 DIAGNOSIS — Z79899 Other long term (current) drug therapy: Secondary | ICD-10-CM | POA: Diagnosis not present

## 2015-01-17 DIAGNOSIS — K5730 Diverticulosis of large intestine without perforation or abscess without bleeding: Secondary | ICD-10-CM | POA: Diagnosis not present

## 2015-01-17 DIAGNOSIS — Z951 Presence of aortocoronary bypass graft: Secondary | ICD-10-CM | POA: Diagnosis not present

## 2015-01-17 DIAGNOSIS — I2510 Atherosclerotic heart disease of native coronary artery without angina pectoris: Secondary | ICD-10-CM | POA: Diagnosis not present

## 2015-01-17 DIAGNOSIS — R109 Unspecified abdominal pain: Secondary | ICD-10-CM

## 2015-01-17 DIAGNOSIS — Z87891 Personal history of nicotine dependence: Secondary | ICD-10-CM | POA: Insufficient documentation

## 2015-01-17 DIAGNOSIS — N210 Calculus in bladder: Secondary | ICD-10-CM | POA: Diagnosis not present

## 2015-01-17 DIAGNOSIS — I700 Atherosclerosis of aorta: Secondary | ICD-10-CM | POA: Diagnosis not present

## 2015-01-17 LAB — URINE MICROSCOPIC-ADD ON

## 2015-01-17 LAB — COMPREHENSIVE METABOLIC PANEL
ALBUMIN: 3.5 g/dL (ref 3.5–5.0)
ALT: 22 U/L (ref 17–63)
AST: 30 U/L (ref 15–41)
Alkaline Phosphatase: 63 U/L (ref 38–126)
Anion gap: 7 (ref 5–15)
BUN: 17 mg/dL (ref 6–20)
CALCIUM: 9.2 mg/dL (ref 8.9–10.3)
CHLORIDE: 106 mmol/L (ref 101–111)
CO2: 26 mmol/L (ref 22–32)
Creatinine, Ser: 1.16 mg/dL (ref 0.61–1.24)
GFR calc non Af Amer: 58 mL/min — ABNORMAL LOW (ref >60)
Glucose, Bld: 107 mg/dL — ABNORMAL HIGH (ref 65–99)
Potassium: 4.1 mmol/L (ref 3.5–5.1)
Sodium: 139 mmol/L (ref 135–145)
Total Bilirubin: 0.6 mg/dL (ref 0.3–1.2)
Total Protein: 6.1 g/dL — ABNORMAL LOW (ref 6.5–8.1)

## 2015-01-17 LAB — URINALYSIS, ROUTINE W REFLEX MICROSCOPIC
Bilirubin Urine: NEGATIVE
Glucose, UA: NEGATIVE mg/dL
Hgb urine dipstick: NEGATIVE
KETONES UR: NEGATIVE mg/dL
NITRITE: NEGATIVE
Protein, ur: NEGATIVE mg/dL
SPECIFIC GRAVITY, URINE: 1.008 (ref 1.005–1.030)
Urobilinogen, UA: 0.2 mg/dL (ref 0.0–1.0)
pH: 7 (ref 5.0–8.0)

## 2015-01-17 LAB — LACTIC ACID, PLASMA: Lactic Acid, Venous: 1.2 mmol/L (ref 0.5–2.0)

## 2015-01-17 LAB — CBC
HEMATOCRIT: 39.2 % (ref 39.0–52.0)
HEMOGLOBIN: 13.4 g/dL (ref 13.0–17.0)
MCH: 31.6 pg (ref 26.0–34.0)
MCHC: 34.2 g/dL (ref 30.0–36.0)
MCV: 92.5 fL (ref 78.0–100.0)
PLATELETS: 151 10*3/uL (ref 150–400)
RBC: 4.24 MIL/uL (ref 4.22–5.81)
RDW: 13.1 % (ref 11.5–15.5)
WBC: 6.5 10*3/uL (ref 4.0–10.5)

## 2015-01-17 LAB — TROPONIN I: Troponin I: <0.03 ng/mL (ref <0.031)

## 2015-01-17 LAB — LIPASE, BLOOD: LIPASE: 70 U/L — AB (ref 22–51)

## 2015-01-17 MED ORDER — SODIUM CHLORIDE 0.9 % IV BOLUS (SEPSIS)
500.0000 mL | Freq: Once | INTRAVENOUS | Status: AC
  Administered 2015-01-17: 500 mL via INTRAVENOUS

## 2015-01-17 MED ORDER — MORPHINE SULFATE 4 MG/ML IJ SOLN
4.0000 mg | Freq: Once | INTRAMUSCULAR | Status: AC
  Administered 2015-01-17: 4 mg via INTRAVENOUS
  Filled 2015-01-17: qty 1

## 2015-01-17 MED ORDER — HYDROCODONE-ACETAMINOPHEN 5-325 MG PO TABS: 1.0000 | ORAL_TABLET | ORAL | Status: DC | PRN

## 2015-01-17 MED ORDER — IOHEXOL 350 MG/ML SOLN
100.0000 mL | Freq: Once | INTRAVENOUS | Status: AC | PRN
  Administered 2015-01-17: 100 mL via INTRAVENOUS

## 2015-01-17 NOTE — ED Notes (Signed)
Pt here for LUQ pain radiating into back since Monday. sts recent cardiac cath. sts right under left rib. Denies N,V,D.

## 2015-01-17 NOTE — ED Notes (Signed)
Pt requesting to wait and not take pain meds at present--

## 2015-01-17 NOTE — ED Provider Notes (Signed)
Patient was signed out for follow-up on CT angiogram chest and the abdomen by Dr. Lorenda Ishihara. The patient had been seen for flank pain. At this time angiogram study does not identify any vascular etiology and no other acute etiologies identified. The patient does have renal stones and pain was predominantly left flank in nature, thus with other things ruled out, I do feel this is most consistent with a kidney stone. Patient is well in appearance and in no acute distress. He will be provided with Vicodin for as needed pain control and advised for follow-up with his family physician this week.  Charlesetta Shanks, MD 01/17/15 1655

## 2015-01-17 NOTE — ED Notes (Addendum)
Patient in CT

## 2015-01-17 NOTE — ED Notes (Signed)
Pt returned from CT °

## 2015-01-17 NOTE — ED Notes (Signed)
Pt going to CT scan

## 2015-01-17 NOTE — Discharge Instructions (Signed)

## 2015-01-17 NOTE — ED Notes (Signed)
Pt returned from ct scan

## 2015-01-17 NOTE — ED Provider Notes (Addendum)
CSN: [id number]     Arrival date & time 01/17/15  1008 History   First MD Initiated Contact with Patient 01/17/15 1024     Chief Complaint  Patient presents with  . Abdominal Pain     (Consider location/radiation/quality/duration/timing/severity/associated sxs/prior Treatment) HPI Comments: Patient presents with worsening left upper flank/abdominal pain rating to the back since Monday. Patient had recent cardiac cath. Pain fairly constant moderate severe. No neurologic symptoms. No anterior chest pain or shortness of breath. Nothing specifically improves or worsens.  The history is provided by the patient.    Past Medical History  Diagnosis Date  . Hypertension   . MI (myocardial infarction) 1998     x2 MI's  . Coronary artery disease     followed by Dr. Claiborne Billings - last office visit 06/08/13  . History of kidney stones    Past Surgical History  Procedure Laterality Date  . Bypass graft  1998  . Joint replacement      Left hip  . Kidney stone surgery  1966  . Inner ear surgery    . Tonsillectomy    . Cystoscopy with retrograde pyelogram, ureteroscopy and stent placement Right 05/11/2013    Procedure: CYSTOSCOPY WITH RIGHT RETROGRADE PYELOGRAM,CYSTOLITHOPAXY/DIAGNOSTIC  DIGITAL FLEXIBLE RIGHT URETEROSCOPY WITH STENT PLACEMENT;  Surgeon: Alexis Frock, MD;  Location: [facility];  Service: Urology;  Laterality: Right;  . Coronary artery bypass graft  1998    6 vessels  . Stented coronary artery  1999  . Cystoscopy with retrograde pyelogram, ureteroscopy and stent placement Right 06/10/2013    Procedure: B and E, URETEROSCOPY AND STENT PLACEMENT;  Surgeon: Alexis Frock, MD;  Location: [facility];  Service: Urology;  Laterality: Right;   Family History  Problem Relation Age of Onset  . Heart disease Mother   . Heart disease Father   . Heart disease Brother   . Cancer Brother   . Heart disease Brother   . Kidney Stones Brother    History  Substance Use  Topics  . Smoking status: Former Smoker    Types: Cigarettes    Quit date: 04/19/1963  . Smokeless tobacco: Never Used  . Alcohol Use: No    Review of Systems  Constitutional: Negative for fever and chills.  HENT: Negative for congestion.   Eyes: Negative for visual disturbance.  Respiratory: Negative for shortness of breath.   Cardiovascular: Negative for chest pain.  Gastrointestinal: Negative for vomiting and abdominal pain.  Genitourinary: Positive for flank pain. Negative for dysuria.  Musculoskeletal: Negative for back pain, neck pain and neck stiffness.  Skin: Negative for rash.  Neurological: Negative for light-headedness and headaches.      Allergies  Crestor; Welchol; Zetia; and Zocor  Home Medications   Prior to Admission medications   Medication Sig Start Date End Date Taking? Authorizing Provider  benazepril (LOTENSIN) 40 MG tablet TAKE 1 TABLET DAILY WITH BREAKFAST. 10/30/14  Yes Troy Sine, MD  Coenzyme Q10 (COQ10) 100 MG CAPS Take 1 tablet by mouth daily.    Yes Historical Provider, MD  Flaxseed, Linseed, (FLAX SEEDS PO) Take 1 tablet by mouth daily.    Yes Historical Provider, MD  folic acid (FOLVITE) 409 MCG tablet Take 1 tablet (400 mcg total) by mouth daily. 07/20/13  Yes Troy Sine, MD  Lecithin 1200 MG CAPS Take 1,200 mg by mouth daily.   Yes Historical Provider, MD  metoprolol tartrate (LOPRESSOR) 25 MG tablet Take 0.5 tablets (12.5 mg total) by mouth 2 (  two) times daily. 08/08/14  Yes Troy Sine, MD  pyridOXINE (B-6) 50 MG tablet Take 50 mg by mouth daily.   Yes Historical Provider, MD  terazosin (HYTRIN) 5 MG capsule Take 5 mg by mouth at bedtime.   Yes Historical Provider, MD  vitamin B-12 (CYANOCOBALAMIN) 500 MCG tablet Take 500 mcg by mouth daily.   Yes Historical Provider, MD  atorvastatin (LIPITOR) 20 MG tablet Take 1 tablet (20 mg total) by mouth daily. Patient not taking: Reported on 01/17/2015 07/25/13   Troy Sine, MD  Omega-3 Fatty  Acids (FISH OIL) 1000 MG CAPS Take 1 capsule (1,000 mg total) by mouth daily. Patient not taking: Reported on 01/17/2015 07/20/13   Troy Sine, MD   BP 145/66 mmHg  Pulse 58  Temp(Src) 98.2 F (36.8 C) (Oral)  Resp 12  SpO2 98% Physical Exam  Constitutional: He is oriented to person, place, and time. He appears well-developed and well-nourished.  HENT:  Head: Normocephalic and atraumatic.  Eyes: Conjunctivae are normal. Right eye exhibits no discharge. Left eye exhibits no discharge.  Neck: Normal range of motion. Neck supple. No tracheal deviation present.  Cardiovascular: Normal rate, regular rhythm and intact distal pulses.   Pulmonary/Chest: Effort normal and breath sounds normal.  Abdominal: Soft. He exhibits no distension. There is tenderness (mild left upper flank). There is no guarding.  Musculoskeletal: He exhibits no edema.  Neurological: He is alert and oriented to person, place, and time.  Skin: Skin is warm. No rash noted.  Psychiatric: He has a normal mood and affect.  Nursing note and vitals reviewed.   ED Course  Procedures (including critical care time) EMERGENCY DEPARTMENT [place] ABD/AORTA EXAM Study: Limited Ultrasound of the Abdominal Aorta.  INDICATIONS:Back pain and Age>55 Indication: Multiple views of the abdominal aorta are obtained from the diaphragmatic hiatus to the aortic bifurcation in transverse and sagittal planes with a multi- Frequency probe.  PERFORMED BY: Myself  IMAGES ARCHIVED?: Yes  FINDINGS: Maximum aortic dimensions are 2  LIMITATIONS:  Bowel gas  INTERPRETATION:  No abdominal aortic aneurysm  COMMENT:   EMERGENCY DEPARTMENT US RENAL EXAM  "Study: Limited Retroperitoneal Ultrasound of Kidneys"  INDICATIONS: Flank pain  Long and short axis of both kidneys were obtained.   PERFORMED BY: Myself  IMAGES ARCHIVED?: Yes  LIMITATIONS: Body habitus  VIEWS USED: Long axis and Short axis   INTERPRETATION: No hydro   CPT Code:  [phone] (limited retroperitoneal)   Labs Review Labs Reviewed  LIPASE, BLOOD - Abnormal; Notable for the following:    Lipase 70 (*)    All other components within normal limits  COMPREHENSIVE METABOLIC PANEL - Abnormal; Notable for the following:    Glucose, Bld 107 (*)    Total Protein 6.1 (*)    GFR calc non Af Amer 58 (*)    All other components within normal limits  URINALYSIS, ROUTINE W REFLEX MICROSCOPIC (NOT AT [facility]) - Abnormal; Notable for the following:    Leukocytes, UA TRACE (*)    All other components within normal limits  CBC  LACTIC ACID, PLASMA  TROPONIN I  URINE MICROSCOPIC-ADD ON    Imaging Review Dg Chest 2 View  01/17/2015   CLINICAL DATA:  81 year old male with epigastric pain for 3 days. Former smoker. Initial encounter.  EXAM: CHEST  2 VIEW  COMPARISON:  05/03/2014 and earlier.  FINDINGS: Stable sequelae of CABG. Stable lung volumes since 2014. Normal cardiac size and mediastinal contours. Visualized tracheal air column is  within normal limits. No pneumothorax or pneumoperitoneum. Negative visible bowel gas pattern. No pleural effusion or acute pulmonary opacity. No acute osseous abnormality identified.  IMPRESSION: No acute cardiopulmonary abnormality.   Electronically Signed   By: Genevie Ann M.D.   On: 01/17/2015 12:36   Ct Renal Stone Study  01/17/2015   CLINICAL DATA:  Acute left flank pain  EXAM: CT ABDOMEN AND PELVIS WITHOUT CONTRAST  TECHNIQUE: Multidetector CT imaging of the abdomen and pelvis was performed following the standard protocol without IV contrast.  COMPARISON:  05/11/2013  FINDINGS: Lung bases are unremarkable. Mitral valve calcifications are noted. Sagittal images of the spine shows degenerative changes thoracolumbar spine. Disc space flattening with vacuum disc phenomenon at L5-S1 level. Unenhanced liver shows no biliary ductal dilatation. Question small layering gallbladder sludge. Unenhanced pancreas, spleen and adrenal glands are unremarkable.  Bilateral kidney shows cortical thinning. There is nonobstructive calculus in midpole of the left kidney measures 3.5 mm. Nonobstructive calculus in midpole of the right kidney measures 2 mm. There is a cyst in midpole of the right kidney measures 2.9 cm. Parapelvic cysts left kidney are stable. There is a exophytic cyst in upper to midpole anterior aspect of the left kidney measures 1.6 cm.  No hydronephrosis or hydroureter.  No calcified ureteral calculi.  Atherosclerotic calcifications of abdominal aorta and iliac arteries. Moderate stool noted in right colon and transverse colon.  Atherosclerotic calcifications are noted bilateral renal artery origin.  No small bowel obstruction. No ascites or free air. No adenopathy. There is a normal appendix. Moderate stool noted in right colon. No pericecal inflammation.  Prostate gland calcifications are noted. Evaluation of the pelvis is limited by metallic artifacts from left hip prosthesis. There is a distended urinary bladder. A calcified calculus in right posterior aspect of the bladder measures 1.5 mm. Again noted a posterior urinary bladder diverticulum measures 5.5 cm. Multiple calcified calculi within diverticulum are again noted the largest measures 7.2 cm.  There is a left inguinal scrotal canal hernia containing fat measures 2.6 cm without evidence of acute complication. Bilateral distal ureter is unremarkable.  IMPRESSION: 1. There is bilateral nonobstructive nephrolithiasis. 2. Bilateral renal cysts are noted. 3. No hydronephrosis or hydroureter.  No calcified ureteral calculi. 4. Moderate stool noted in right colon. No pericecal inflammation. Normal appendix. 5. There is a distended urinary bladder. Again noted midline posterior urinary bladder diverticulum containing multiple calcified calculi. There is a tiny calcified calculus in right posterior aspect of the urinary bladder measures 1.4 mm. 6. Bilateral distal ureter is unremarkable. 7. No small bowel  obstruction.   Electronically Signed   By: Lahoma Crocker M.D.   On: 01/17/2015 13:57     EKG Interpretation   Date/Time:  Wednesday January 17 2015 10:13:55 EDT Ventricular Rate:  68 PR Interval:  169 QRS Duration: 98 QT Interval:  386 QTC Calculation: 410 R Axis:   -8 Text Interpretation:  Age not entered, assumed to be  81 years old for  purpose of ECG interpretation Sinus rhythm Inferior similar to previous,  non specific ST changes Confirmed by Arieal Cuoco  MD, Arius Harnois ([id number]) on  01/17/2015 11:04:40 AM      MDM   Final diagnoses:  Acute left flank pain   Patient presents with left upper abdominal/flank pain rating to the back. Similar to previous kidney stone. Patient had recent cardiac workup, pain under left flank ribs. Bedside ultrasound no abdominal aortic aneurysm, pain mild currently. Possibly mild hydronephrosis on bedside ultrasound. Plan for  CT stone study, if no etiology of pain patient will require CT angiogram to look for vascular cause.  Pt improved however still left flank pain radiating to back. CT angio dissection ordered.   Pt signed out to fup results for final dispo.  Filed Vitals:   01/17/15 1445  BP: 145/66  Pulse: 58  Temp:   Resp: 12       Elnora Morrison, MD 01/17/15 [place], MD 01/27/15 [id number]  Elnora Morrison, MD 04/03/15 [phone]

## 2015-01-31 ENCOUNTER — Telehealth: Payer: Self-pay | Admitting: Cardiovascular Disease

## 2015-01-31 NOTE — Telephone Encounter (Signed)
Returned a call to patient and wife. Informed them that after reviewing the chart  the labs that Dr. Claiborne Billings  ordered was done except for the vitamin B-12 and  thyroid study. He will still need to have these studies done. They said okay. They will have it done tomorrow.

## 2015-01-31 NOTE — Telephone Encounter (Signed)
Routed to Dr. Claiborne Billings and Barry Brunner

## 2015-01-31 NOTE — Telephone Encounter (Signed)
She wanted you to know Jared Dunlap went to the ER 2 weeks ago. He had  A lot of blood work that day. He was suppose to have lab work here on 02-07-15. He will not be coming,please look at all the lab results from 2 weeks ago.

## 2015-02-07 ENCOUNTER — Telehealth: Payer: Self-pay | Admitting: Cardiovascular Disease

## 2015-02-07 ENCOUNTER — Other Ambulatory Visit: Payer: Self-pay | Admitting: *Deleted

## 2015-02-07 DIAGNOSIS — R5383 Other fatigue: Secondary | ICD-10-CM | POA: Diagnosis not present

## 2015-02-07 DIAGNOSIS — R899 Unspecified abnormal finding in specimens from other organs, systems and tissues: Secondary | ICD-10-CM

## 2015-02-07 DIAGNOSIS — R7989 Other specified abnormal findings of blood chemistry: Secondary | ICD-10-CM | POA: Diagnosis not present

## 2015-02-07 DIAGNOSIS — R799 Abnormal finding of blood chemistry, unspecified: Secondary | ICD-10-CM | POA: Diagnosis not present

## 2015-02-07 NOTE — Telephone Encounter (Signed)
Routed to [place]. [place] CMA

## 2015-02-07 NOTE — Telephone Encounter (Signed)
Lab needed order for TSH. Order placed into epic.

## 2015-02-08 LAB — HEPATIC FUNCTION PANEL
ALBUMIN: 3.7 g/dL (ref 3.6–5.1)
ALT: 29 U/L (ref 9–46)
AST: 37 U/L — AB (ref 10–35)
Alkaline Phosphatase: 63 U/L (ref 40–115)
Bilirubin, Direct: 0.1 mg/dL (ref <=0.2)
Indirect Bilirubin: 0.3 mg/dL (ref 0.2–1.2)
Total Bilirubin: 0.4 mg/dL (ref 0.2–1.2)
Total Protein: 6.3 g/dL (ref 6.1–8.1)

## 2015-02-08 LAB — TSH: TSH: 3.997 u[IU]/mL (ref 0.350–4.500)

## 2015-02-19 DIAGNOSIS — I25810 Atherosclerosis of coronary artery bypass graft(s) without angina pectoris: Secondary | ICD-10-CM | POA: Diagnosis not present

## 2015-02-19 DIAGNOSIS — I10 Essential (primary) hypertension: Secondary | ICD-10-CM | POA: Diagnosis not present

## 2015-02-19 DIAGNOSIS — Z955 Presence of coronary angioplasty implant and graft: Secondary | ICD-10-CM | POA: Diagnosis not present

## 2015-02-19 DIAGNOSIS — N529 Male erectile dysfunction, unspecified: Secondary | ICD-10-CM | POA: Diagnosis not present

## 2015-02-19 DIAGNOSIS — R809 Proteinuria, unspecified: Secondary | ICD-10-CM | POA: Diagnosis not present

## 2015-02-19 DIAGNOSIS — N182 Chronic kidney disease, stage 2 (mild): Secondary | ICD-10-CM | POA: Diagnosis not present

## 2015-02-19 DIAGNOSIS — E785 Hyperlipidemia, unspecified: Secondary | ICD-10-CM | POA: Diagnosis not present

## 2015-02-19 DIAGNOSIS — D692 Other nonthrombocytopenic purpura: Secondary | ICD-10-CM | POA: Diagnosis not present

## 2015-03-17 DIAGNOSIS — Z23 Encounter for immunization: Secondary | ICD-10-CM | POA: Diagnosis not present

## 2015-04-27 ENCOUNTER — Other Ambulatory Visit: Payer: Self-pay | Admitting: Cardiovascular Disease

## 2015-05-16 ENCOUNTER — Other Ambulatory Visit: Payer: Self-pay | Admitting: Cardiovascular Disease

## 2015-05-16 NOTE — Telephone Encounter (Signed)
Rx(s) sent to pharmacy electronically.  

## 2015-08-02 ENCOUNTER — Other Ambulatory Visit: Payer: Self-pay | Admitting: Cardiovascular Disease

## 2015-08-09 ENCOUNTER — Other Ambulatory Visit: Payer: Self-pay | Admitting: Cardiovascular Disease

## 2015-08-09 NOTE — Telephone Encounter (Signed)
REFILL 

## 2015-08-20 DIAGNOSIS — E784 Other hyperlipidemia: Secondary | ICD-10-CM | POA: Diagnosis not present

## 2015-08-20 DIAGNOSIS — I10 Essential (primary) hypertension: Secondary | ICD-10-CM | POA: Diagnosis not present

## 2015-08-20 DIAGNOSIS — Z125 Encounter for screening for malignant neoplasm of prostate: Secondary | ICD-10-CM | POA: Diagnosis not present

## 2015-08-20 DIAGNOSIS — N390 Urinary tract infection, site not specified: Secondary | ICD-10-CM | POA: Diagnosis not present

## 2015-08-20 DIAGNOSIS — R8290 Unspecified abnormal findings in urine: Secondary | ICD-10-CM | POA: Diagnosis not present

## 2015-08-27 DIAGNOSIS — R808 Other proteinuria: Secondary | ICD-10-CM | POA: Diagnosis not present

## 2015-08-27 DIAGNOSIS — D692 Other nonthrombocytopenic purpura: Secondary | ICD-10-CM | POA: Diagnosis not present

## 2015-08-27 DIAGNOSIS — R319 Hematuria, unspecified: Secondary | ICD-10-CM | POA: Diagnosis not present

## 2015-08-27 DIAGNOSIS — N183 Chronic kidney disease, stage 3 (moderate): Secondary | ICD-10-CM | POA: Diagnosis not present

## 2015-08-27 DIAGNOSIS — B0229 Other postherpetic nervous system involvement: Secondary | ICD-10-CM | POA: Diagnosis not present

## 2015-08-27 DIAGNOSIS — E7800 Pure hypercholesterolemia, unspecified: Secondary | ICD-10-CM | POA: Diagnosis not present

## 2015-08-27 DIAGNOSIS — I25810 Atherosclerosis of coronary artery bypass graft(s) without angina pectoris: Secondary | ICD-10-CM | POA: Diagnosis not present

## 2015-08-27 DIAGNOSIS — Z0000 Encounter for general adult medical examination without abnormal findings: Secondary | ICD-10-CM | POA: Diagnosis not present

## 2015-08-27 DIAGNOSIS — Z955 Presence of coronary angioplasty implant and graft: Secondary | ICD-10-CM | POA: Diagnosis not present

## 2015-11-14 ENCOUNTER — Other Ambulatory Visit: Payer: Self-pay | Admitting: Cardiovascular Disease

## 2015-11-15 NOTE — Telephone Encounter (Signed)
Rx request sent to pharmacy.  

## 2015-12-17 ENCOUNTER — Other Ambulatory Visit: Payer: Self-pay | Admitting: *Deleted

## 2015-12-17 MED ORDER — METOPROLOL TARTRATE 25 MG PO TABS: 12.5000 mg | ORAL_TABLET | Freq: Two times a day (BID) | ORAL | Status: DC

## 2015-12-24 ENCOUNTER — Other Ambulatory Visit: Payer: Self-pay | Admitting: Cardiovascular Disease

## 2015-12-24 NOTE — Telephone Encounter (Signed)
Rx request sent to pharmacy.  

## 2016-02-04 ENCOUNTER — Ambulatory Visit (INDEPENDENT_AMBULATORY_CARE_PROVIDER_SITE_OTHER): Payer: Commercial Managed Care - HMO | Admitting: Cardiovascular Disease

## 2016-02-04 VITALS — BP 126/66 | HR 64 | Ht 64.0 in | Wt 178.0 lb

## 2016-02-04 DIAGNOSIS — I25708 Atherosclerosis of coronary artery bypass graft(s), unspecified, with other forms of angina pectoris: Secondary | ICD-10-CM

## 2016-02-04 DIAGNOSIS — E785 Hyperlipidemia, unspecified: Secondary | ICD-10-CM | POA: Diagnosis not present

## 2016-02-04 DIAGNOSIS — I2510 Atherosclerotic heart disease of native coronary artery without angina pectoris: Secondary | ICD-10-CM | POA: Diagnosis not present

## 2016-02-04 DIAGNOSIS — I10 Essential (primary) hypertension: Secondary | ICD-10-CM

## 2016-02-04 NOTE — Patient Instructions (Signed)
Your physician has requested that you have en exercise stress myoview and office visit in 1 year . For further information please visit [email or web address]. Please follow instruction sheet, as given.

## 2016-02-05 ENCOUNTER — Encounter: Payer: Self-pay | Admitting: Cardiovascular Disease

## 2016-02-05 DIAGNOSIS — I2510 Atherosclerotic heart disease of native coronary artery without angina pectoris: Secondary | ICD-10-CM | POA: Insufficient documentation

## 2016-02-05 HISTORY — DX: Atherosclerotic heart disease of native coronary artery without angina pectoris: I25.10

## 2016-02-05 NOTE — Progress Notes (Signed)
Patient ID: JOSH NICOLOSI, male   DOB: 1932-05-19, 82 y.o.   MRN: [id number]    Primary M.D.: Dr.Tisovic  HPI: JERRIS FLEER is a 82 y.o. male who presents to the office for a cardiology evaluation.  I last saw him in the office in October 2015.  Mr. Poplaski has known CAD and in August 1998 underwent CABG surgery x6 with LIMA to the LAD, saphenous vein graft to diagonal, sequential vein graft to the OM1-OM2, and vein graft to the right coronary artery.  In 2001 a Cardiolite study which was done for routine followup evaluation when the patient was asymptomatic demonstrated new circumflex ischemia. Catheterization revealed total occlusion of the graft to the marginal 1 vessel but he did have a patent sequential limb from OM1-OM 2. There was a 95% native circumflex stenosis  .  At that time, I successfully performed PCI/stenting of the native circumflex vessel with a 3.5x15 mm bare metal  NIR stent. Subsequent nuclear perfusion studies has continued to show normal fairly normal perfusion. His last nuclear perfusion study was done in May 2014 which wasshowed mild apical thinning and basal lateral defect without associated ischemia.  Mr.Yawn is remaining active. He plays golf at least 3 days per week. He exercises at the gym at least 3 days per week both doing aerobics as well as weights. He remains active doing yard work as well.   He has a history of hyperlipidemia and hypertension.  He has been maintained on benazepril 40 mg, Toprol 25 mg for blood pressure control.  He has tolerated atorvastatin 20 mg in addition to fish oil for his hyperlipidemia.  He has had difficulty tolerating Crestor, Zetia, simvastatin, and WelChol, and has been only able to take low-dose atorvastatin with coenzyme Q10 for tolerability.  He sees Dr. Rosana Hoes for primary care.  Laboratory in 2015 demonstrated elevated lipids with a total cholesterol 215 LDL cholesterol 156, triglyceride level at 112, HDL 37, and non-HDL  cholesterol 178.  BUN was 28, creatinine 1.0.  Since I last saw Mr. Hillery, he has continued to be active.  He plays golf at least 2-3 times per week, he exercises regularly.  In July 2016.  He underwent a follow-up nuclear perfusion study.  This showed an ejection fraction at 47% and there was mild basal inferoseptal hypokinesis.  There was no evidence for any ischemia.  In July 2016.  He developed bilateral nonobstructive nephro lithiasis.  Kidney stones were removed by Dr. Tresa Moore.  He tells me that Dr.Tisovic had checked blood work earlier this year.  He remains angina free.  He denies PND or orthopnea.  Past Medical History:  Diagnosis Date  . Coronary artery disease    followed by Dr. Claiborne Billings - last office visit 06/08/13  . History of kidney stones   . Hypertension   . MI (myocardial infarction) 1998    x2 MI's    Past Surgical History:  Procedure Laterality Date  . BYPASS GRAFT  1998  . CORONARY ARTERY BYPASS GRAFT  1998   6 vessels  . CYSTOSCOPY WITH RETROGRADE PYELOGRAM, URETEROSCOPY AND STENT PLACEMENT Right 05/11/2013   Procedure: CYSTOSCOPY WITH RIGHT RETROGRADE PYELOGRAM,CYSTOLITHOPAXY/DIAGNOSTIC  DIGITAL FLEXIBLE RIGHT URETEROSCOPY WITH STENT PLACEMENT;  Surgeon: Alexis Frock, MD;  Location: [facility];  Service: Urology;  Laterality: Right;  . CYSTOSCOPY WITH RETROGRADE PYELOGRAM, URETEROSCOPY AND STENT PLACEMENT Right 06/10/2013   Procedure: CYSTOSCOPY WITH RETROGRADE PYELOGRAM, URETEROSCOPY AND STENT PLACEMENT;  Surgeon: Alexis Frock, MD;  Location: [facility];  Service: Urology;  Laterality: Right;  . INNER EAR SURGERY    . JOINT REPLACEMENT     Left hip  . KIDNEY STONE SURGERY  1966  . stented coronary artery  1999  . TONSILLECTOMY      Allergies  Allergen Reactions  . Crestor [Rosuvastatin] Other (See Comments)    UNSPECIFIED  . Welchol [Colesevelam Hcl] Other (See Comments)    UNSPECIFIED  . Zetia [Ezetimibe] Other (See Comments)    UNSPECIFIED  . Zocor  [Simvastatin] Other (See Comments)    unspecified    Current Outpatient Prescriptions  Medication Sig Dispense Refill  . atorvastatin (LIPITOR) 20 MG tablet Take 1 tablet (20 mg total) by mouth daily. 90 tablet 3  . benazepril (LOTENSIN) 40 MG tablet TAKE 1 TABLET EVERY DAY 90 tablet 1  . Coenzyme Q10 (COQ10) 100 MG CAPS Take 1 tablet by mouth daily.     . Flaxseed, Linseed, (FLAX SEEDS PO) Take 1 tablet by mouth daily.     . folic acid (FOLVITE) 810 MCG tablet Take 1 tablet (400 mcg total) by mouth daily. 90 tablet 3  . HYDROcodone-acetaminophen (NORCO/VICODIN) 5-325 MG per tablet Take 1-2 tablets by mouth every 4 (four) hours as needed for moderate pain or severe pain. 20 tablet 0  . Lecithin 1200 MG CAPS Take 1,200 mg by mouth daily.    . metoprolol tartrate (LOPRESSOR) 25 MG tablet Take 0.5 tablets (12.5 mg total) by mouth 2 (two) times daily. Please keep appointment. 30 tablet 1  . Omega-3 Fatty Acids (FISH OIL) 1000 MG CAPS Take 1 capsule (1,000 mg total) by mouth daily. 90 capsule 3  . pyridOXINE (B-6) 50 MG tablet Take 50 mg by mouth daily.    Marland Kitchen terazosin (HYTRIN) 5 MG capsule Take 5 mg by mouth at bedtime.    . vitamin B-12 (CYANOCOBALAMIN) 500 MCG tablet Take 500 mcg by mouth daily.     No current facility-administered medications for this visit.     Social History   Social History  . Marital status: Married    Spouse name: N/A  . Number of children: N/A  . Years of education: N/A   Occupational History  . Not on file.   Social History Main Topics  . Smoking status: Former Smoker    Types: Cigarettes    Quit date: 04/19/1963  . Smokeless tobacco: Never Used  . Alcohol use No  . Drug use: No  . Sexual activity: No   Other Topics Concern  . Not on file   Social History Narrative  . No narrative on file    Family History  Problem Relation Age of Onset  . Heart disease Mother   . Heart disease Father   . Heart disease Brother   . Cancer Brother   . Heart  disease Brother   . Kidney Stones Brother    Socially he is married for 56 years. 3 children 4 grandchildren 3 great-grandchildren. There is no alcohol use. He quit tobacco in 1965.  ROS General: Negative; No fevers, chills, or night sweats;  HEENT: Negative; No changes in vision or hearing, sinus congestion, difficulty swallowing Pulmonary: Negative; No cough, wheezing, shortness of breath, hemoptysis Cardiovascular: See history of present illness; No chest pain, presyncope, syncope, palpitations GI: Negative; No nausea, vomiting, diarrhea, or abdominal pain GU: Negative; No dysuria, hematuria, or difficulty voiding Musculoskeletal: Negative; no myalgias, joint pain, or weakness Hematologic/Oncology: Negative; no easy bruising, bleeding Endocrine: Negative; no heat/cold intolerance; no diabetes Neuro:  Negative; no changes in balance, headaches Skin: Negative; No rashes or skin lesions Psychiatric: Negative; No behavioral problems, depression Sleep: Negative; No snoring, daytime sleepiness, hypersomnolence, bruxism, restless legs, hypnogognic hallucinations, no cataplexy Other comprehensive 14 point system review is negative.  PE BP 126/66   Pulse 64   Ht _0  (1.626 m)   Wt 178 lb (80.7 kg)   BMI 30.55 kg/m   Repeat BP 132/78  Wt Readings from Last 3 Encounters:  02/04/16 178 lb (80.7 kg)  01/10/15 181 lb (82.1 kg)  05/03/14 180 lb (81.6 kg)   General: Alert, oriented, no distress.  Skin: normal turgor, no rashes HEENT: Normocephalic, atraumatic. Pupils round and reactive; sclera anicteric;no lid lag.  Nose without nasal septal hypertrophy Mouth/Parynx benign; Mallinpatti scale 3 Neck: No JVD, no carotid bruits with normal carotid upstroke Lungs: clear to ausculatation and percussion; no wheezing or rales Chest wall: Nontender to palpation Heart: RRR, s1 s2 normal 1/6 systolic murmur; no diastolic murmur.  No rubs, thrills or heaves Abdomen: soft, nontender; no  hepatosplenomehaly, BS+; abdominal aorta nontender and not dilated by palpation. Back: No CVA tenderness Pulses 2+ Extremities: Trivial pretibial edema above the sock line; no clubbing cyanosis, Homan's sign negative  Neurologic: grossly nonfocal; cranial nerves grossly normal Psychological: Normal affect and mood.  Normal cognition Psychologic: normal affect and mood.  ECG (independently read by me): Normal sinus rhythm at 64 bpm.  Inferior Q waves in 3 and aVF.  October 2015 ECG (independently read by me): Sinus bradycardia 55 beats per minute.  Nonspecific ST changes.  Prior October 2014 ECG: Normal sinus rhythm. QTc interval 399 ms.  LABS: BMP Latest Ref Rng & Units 01/17/2015 01/04/2015 05/03/2014  Glucose 65 - 99 mg/dL 107(H) 95 104(H)  BUN 6 - 20 mg/dL 17 29(H) 20  Creatinine 0.61 - 1.24 mg/dL 1.16 1.07 1.07  Sodium 135 - 145 mmol/L 139 143 140  Potassium 3.5 - 5.1 mmol/L 4.1 4.5 4.4  Chloride 101 - 111 mmol/L 106 108 104  CO2 22 - 32 mmol/L _1 Calcium 8.9 - 10.3 mg/dL 9.2 8.5 8.8   Hepatic Function Latest Ref Rng & Units 02/07/2015 01/17/2015 01/04/2015  Total Protein 6.1 - 8.1 g/dL 6.3 6.1(L) 6.3  Albumin 3.6 - 5.1 g/dL 3.7 3.5 3.7  AST 10 - 35 U/L 37(H) 30 51(H)  ALT 9 - 46 U/L 29 22 60(H)  Alk Phosphatase 40 - 115 U/L 63 63 64  Total Bilirubin 0.2 - 1.2 mg/dL 0.4 0.6 0.5  Bilirubin, Direct <=0.2 mg/dL 0.1 - -   CBC Latest Ref Rng & Units 01/17/2015 01/04/2015 05/03/2014  WBC 4.0 - 10.5 K/uL 6.5 6.5 7.7  Hemoglobin 13.0 - 17.0 g/dL 13.4 13.3 13.9  Hematocrit 39.0 - 52.0 % 39.2 41.2 40.1  Platelets 150 - 400 K/uL 151 141(L) 141(L)   Lab Results  Component Value Date   MCV 92.5 01/17/2015   MCV 94.9 01/04/2015   MCV 90.9 05/03/2014   Lab Results  Component Value Date   TSH 3.997 02/07/2015   Lipid Panel  No results found for: CHOL, TRIG, HDL, CHOLHDL, VLDL, LDLCALC, LDLDIRECT   RADIOLOGY: No results found.    ASSESSMENT AND PLAN: Mr. Tamon Parkerson is an  82 year old Caucasian male who underwent  CABG surgery x6 in August 1998. In 2001 he was entirely asymptomatic but a nuclear perfusion study demonstrated a large area of ischemia laterally and at that time cardiac catheterization revealed an occluded graft  that supplied the circumflex marginal vessel. I performed a successful percutaneous coronary intervention to his native LCx. Subsequently, he has continued to do well. He remains very active and denies anginal symptoms.  He has   hyperlipidemia and has been not able to tolerate high-dose statin therapy.  Presently, his blood pressure is well controlled on but has a pleural 40 mg, metoprolol 12.5 mg twice a day, and her Zosyn 5 mg at bedtime.  He continues to tolerate atorvastatin at 20 mg in addition to omega-3 fatty acids for his hyperlipidemia.  He has remained active and denies recurrent anginal symptomatology.  Last year he had issues with kidney stones which required removal.  I reviewed his CT angios of his chest and aorta as well as abdomen, pelvis and renal stone study.  I will try to obtain any recent blood work that he has had with Dr. to severe.  Target LDL is less than 70.  In one year, I will schedule him for 2 year follow-up nuclear perfusion study, which will be 20 years following his CABG revascularization surgery and I will see him in the office for follow-up evaluation.  Time spent: 25 minutes Troy Sine, MD, [facility]  02/05/2016 7:28 PM

## 2016-02-07 ENCOUNTER — Other Ambulatory Visit: Payer: Self-pay | Admitting: Cardiovascular Disease

## 2016-02-11 DIAGNOSIS — Z6829 Body mass index (BMI) 29.0-29.9, adult: Secondary | ICD-10-CM | POA: Diagnosis not present

## 2016-02-11 DIAGNOSIS — N183 Chronic kidney disease, stage 3 (moderate): Secondary | ICD-10-CM | POA: Diagnosis not present

## 2016-02-11 DIAGNOSIS — I10 Essential (primary) hypertension: Secondary | ICD-10-CM | POA: Diagnosis not present

## 2016-02-11 DIAGNOSIS — Z955 Presence of coronary angioplasty implant and graft: Secondary | ICD-10-CM | POA: Diagnosis not present

## 2016-02-11 DIAGNOSIS — I25810 Atherosclerosis of coronary artery bypass graft(s) without angina pectoris: Secondary | ICD-10-CM | POA: Diagnosis not present

## 2016-02-11 DIAGNOSIS — D696 Thrombocytopenia, unspecified: Secondary | ICD-10-CM | POA: Diagnosis not present

## 2016-02-11 DIAGNOSIS — I1310 Hypertensive heart and chronic kidney disease without heart failure, with stage 1 through stage 4 chronic kidney disease, or unspecified chronic kidney disease: Secondary | ICD-10-CM | POA: Diagnosis not present

## 2016-02-11 DIAGNOSIS — E7800 Pure hypercholesterolemia, unspecified: Secondary | ICD-10-CM | POA: Diagnosis not present

## 2016-03-29 DIAGNOSIS — Z23 Encounter for immunization: Secondary | ICD-10-CM | POA: Diagnosis not present

## 2016-06-20 ENCOUNTER — Telehealth: Payer: Self-pay | Admitting: Cardiovascular Disease

## 2016-06-20 NOTE — Telephone Encounter (Signed)
Wife notified to call PCP-there si someone on call there

## 2016-06-20 NOTE — Telephone Encounter (Signed)
Pt has a terrible cold,wife would like for you to call in a Z pack for him please. Please call this to [facility] on [facility].His primary doctor is not in today. Please let her know if you can do this.

## 2016-07-07 ENCOUNTER — Other Ambulatory Visit: Payer: Self-pay | Admitting: Cardiovascular Disease

## 2016-07-07 DIAGNOSIS — R05 Cough: Secondary | ICD-10-CM | POA: Diagnosis not present

## 2016-07-07 DIAGNOSIS — R197 Diarrhea, unspecified: Secondary | ICD-10-CM | POA: Diagnosis not present

## 2016-07-07 DIAGNOSIS — I10 Essential (primary) hypertension: Secondary | ICD-10-CM | POA: Diagnosis not present

## 2016-07-07 DIAGNOSIS — Z6829 Body mass index (BMI) 29.0-29.9, adult: Secondary | ICD-10-CM | POA: Diagnosis not present

## 2016-07-20 IMAGING — DX DG CHEST 2V
2 series · 2 of 2 positions shown · non-contrast
Comparison: 05/03/2014 and earlier.

CLINICAL DATA: 82-year-old male with epigastric pain for 3 days.
Former smoker. Initial encounter.

EXAM:
CHEST  2 VIEW

[chest pa]
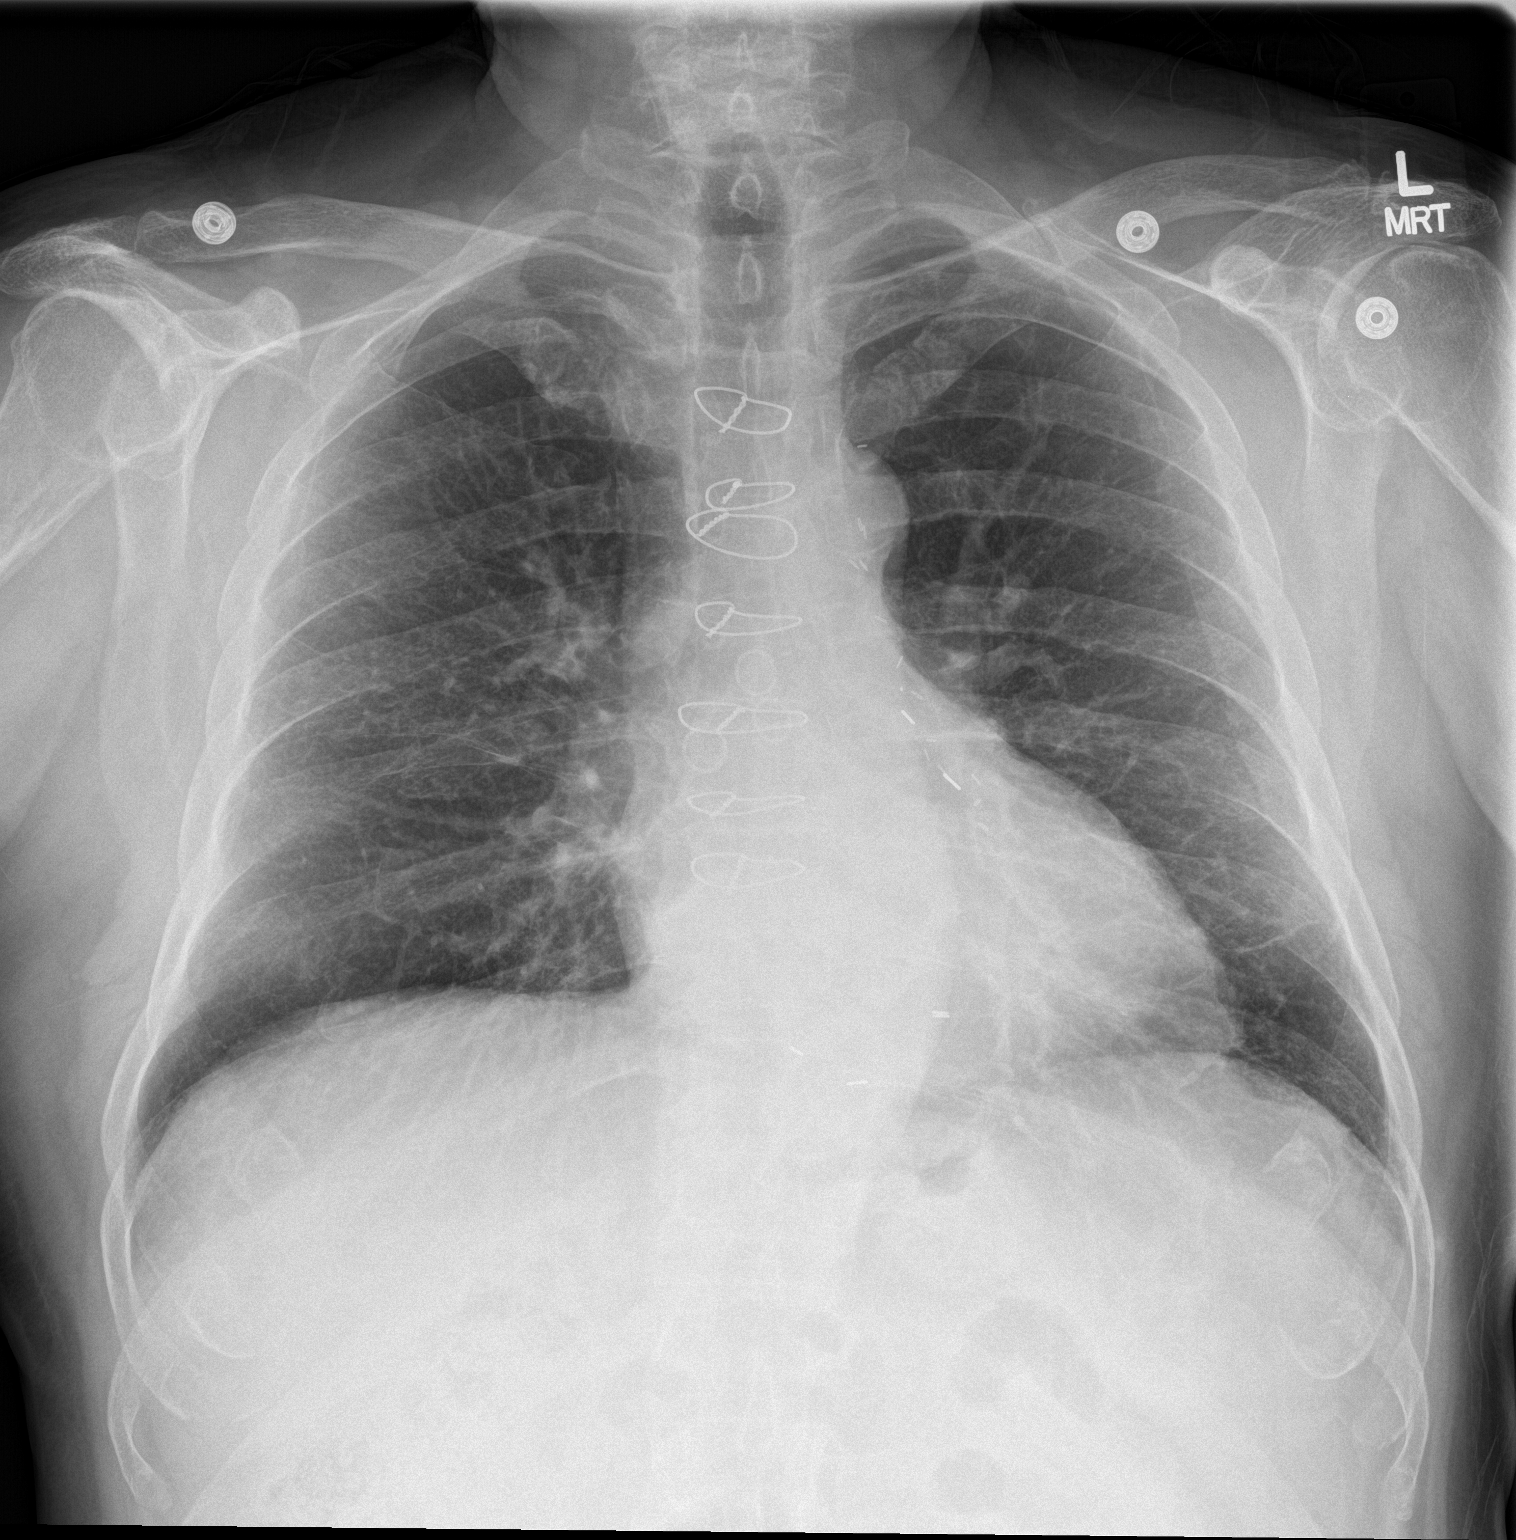

[chest lat]
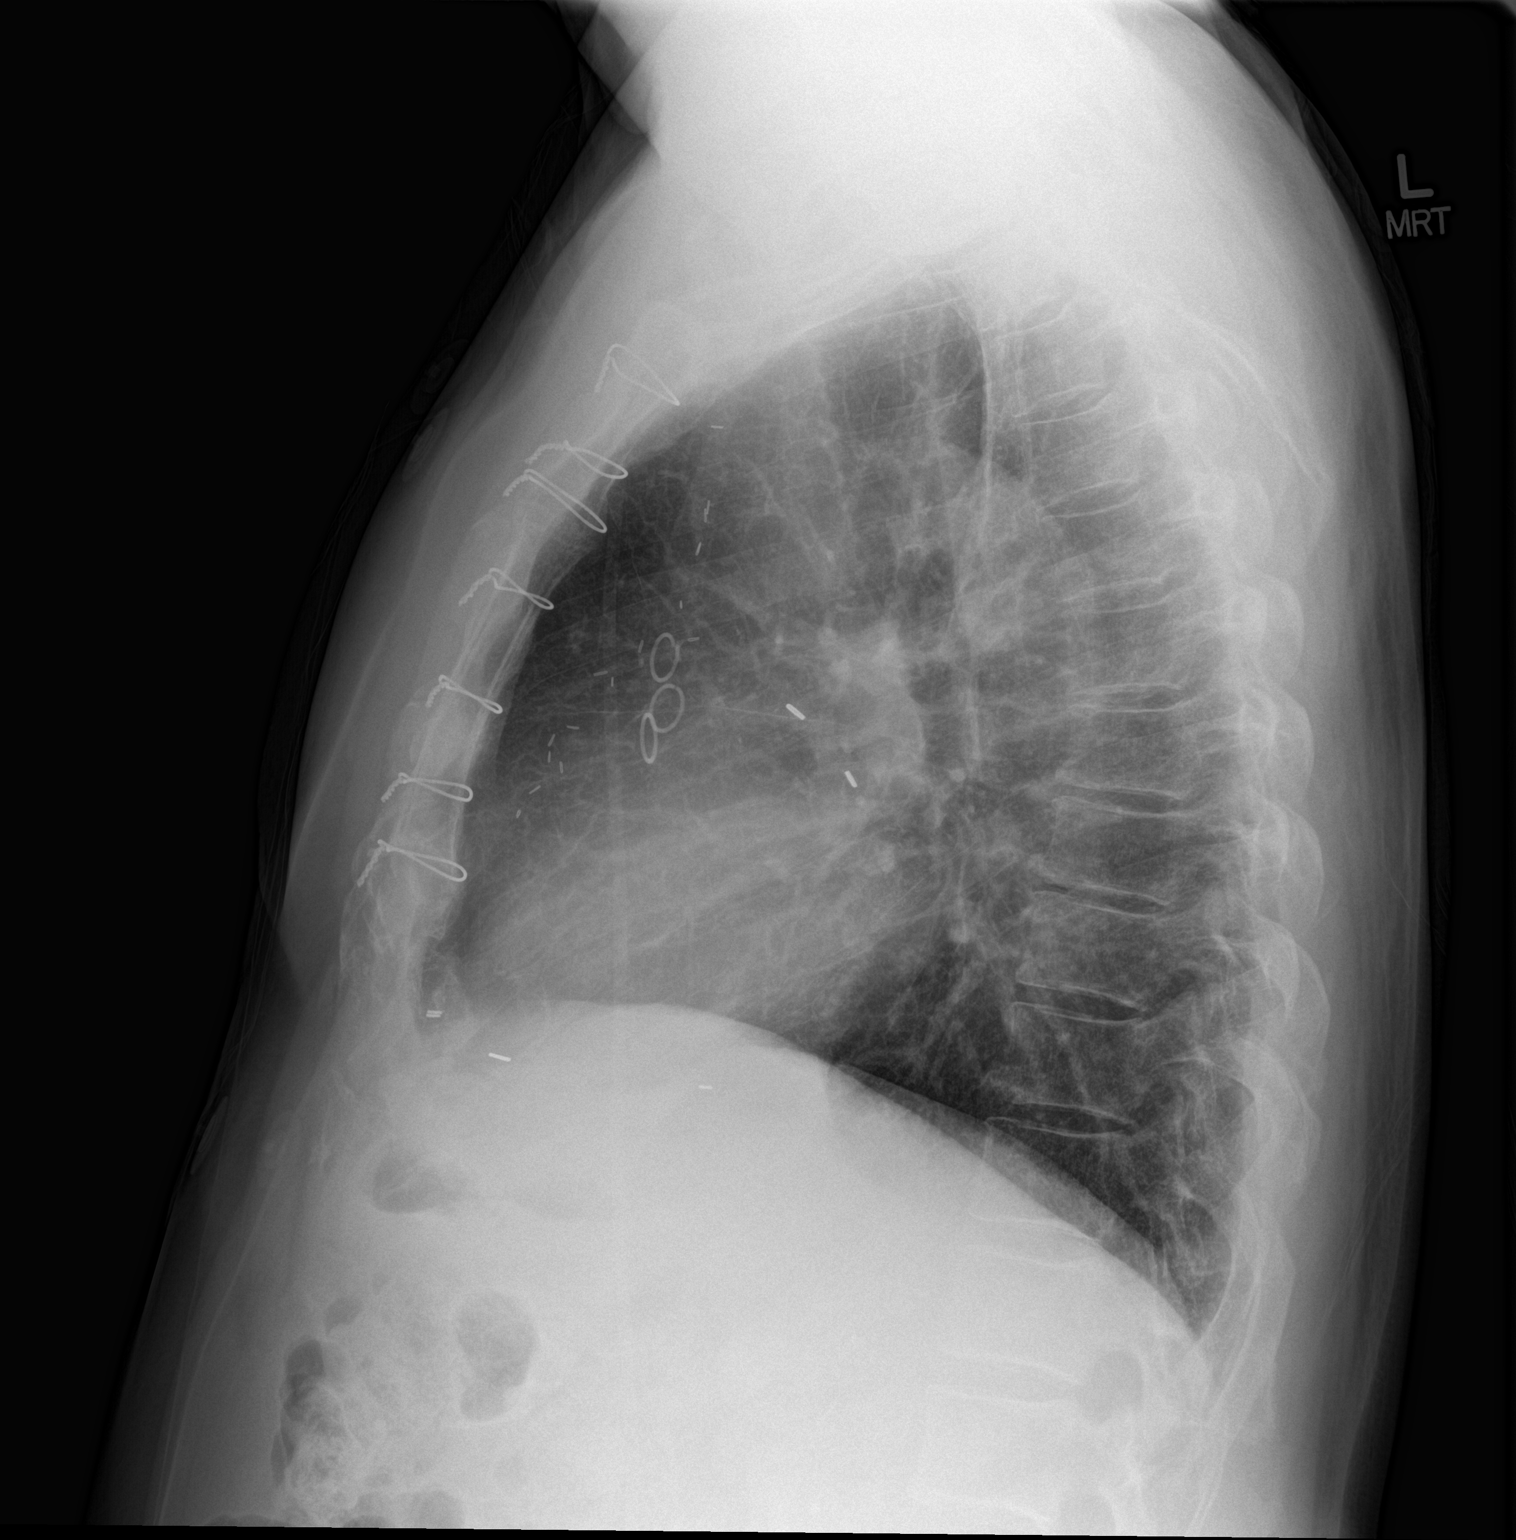

[2 of 2 positions shown; findings below may reference images not displayed]

FINDINGS: Stable sequelae of CABG. Stable lung volumes since 2973. Normal
cardiac size and mediastinal contours. Visualized tracheal air
column is within normal limits. No pneumothorax or pneumoperitoneum.
Negative visible bowel gas pattern. No pleural effusion or acute
pulmonary opacity. No acute osseous abnormality identified.
IMPRESSION: No acute cardiopulmonary abnormality.

## 2016-09-03 DIAGNOSIS — Z125 Encounter for screening for malignant neoplasm of prostate: Secondary | ICD-10-CM | POA: Diagnosis not present

## 2016-09-03 DIAGNOSIS — N390 Urinary tract infection, site not specified: Secondary | ICD-10-CM | POA: Diagnosis not present

## 2016-09-03 DIAGNOSIS — I10 Essential (primary) hypertension: Secondary | ICD-10-CM | POA: Diagnosis not present

## 2016-09-03 DIAGNOSIS — R8299 Other abnormal findings in urine: Secondary | ICD-10-CM | POA: Diagnosis not present

## 2016-09-03 DIAGNOSIS — E7800 Pure hypercholesterolemia, unspecified: Secondary | ICD-10-CM | POA: Diagnosis not present

## 2016-09-10 DIAGNOSIS — I1310 Hypertensive heart and chronic kidney disease without heart failure, with stage 1 through stage 4 chronic kidney disease, or unspecified chronic kidney disease: Secondary | ICD-10-CM | POA: Diagnosis not present

## 2016-09-10 DIAGNOSIS — I25810 Atherosclerosis of coronary artery bypass graft(s) without angina pectoris: Secondary | ICD-10-CM | POA: Diagnosis not present

## 2016-09-10 DIAGNOSIS — D692 Other nonthrombocytopenic purpura: Secondary | ICD-10-CM | POA: Diagnosis not present

## 2016-09-10 DIAGNOSIS — Z0000 Encounter for general adult medical examination without abnormal findings: Secondary | ICD-10-CM | POA: Diagnosis not present

## 2016-09-10 DIAGNOSIS — R809 Proteinuria, unspecified: Secondary | ICD-10-CM | POA: Diagnosis not present

## 2016-09-10 DIAGNOSIS — E7800 Pure hypercholesterolemia, unspecified: Secondary | ICD-10-CM | POA: Diagnosis not present

## 2016-09-10 DIAGNOSIS — N183 Chronic kidney disease, stage 3 (moderate): Secondary | ICD-10-CM | POA: Diagnosis not present

## 2016-09-10 DIAGNOSIS — I10 Essential (primary) hypertension: Secondary | ICD-10-CM | POA: Diagnosis not present

## 2016-09-10 DIAGNOSIS — M1990 Unspecified osteoarthritis, unspecified site: Secondary | ICD-10-CM | POA: Diagnosis not present

## 2016-10-03 DIAGNOSIS — H00012 Hordeolum externum right lower eyelid: Secondary | ICD-10-CM | POA: Diagnosis not present

## 2016-10-03 DIAGNOSIS — H40033 Anatomical narrow angle, bilateral: Secondary | ICD-10-CM | POA: Diagnosis not present

## 2016-10-14 DIAGNOSIS — H00012 Hordeolum externum right lower eyelid: Secondary | ICD-10-CM | POA: Diagnosis not present

## 2016-12-15 ENCOUNTER — Telehealth: Payer: Self-pay | Admitting: Cardiovascular Disease

## 2016-12-15 DIAGNOSIS — M1990 Unspecified osteoarthritis, unspecified site: Secondary | ICD-10-CM | POA: Diagnosis not present

## 2016-12-15 DIAGNOSIS — I10 Essential (primary) hypertension: Secondary | ICD-10-CM | POA: Diagnosis not present

## 2016-12-15 DIAGNOSIS — I2510 Atherosclerotic heart disease of native coronary artery without angina pectoris: Secondary | ICD-10-CM | POA: Diagnosis not present

## 2016-12-15 DIAGNOSIS — D692 Other nonthrombocytopenic purpura: Secondary | ICD-10-CM | POA: Diagnosis not present

## 2016-12-15 DIAGNOSIS — H9190 Unspecified hearing loss, unspecified ear: Secondary | ICD-10-CM | POA: Diagnosis not present

## 2016-12-15 NOTE — Telephone Encounter (Signed)
12/15/16 - Received records from [place] at [facility] for upcoming appointment with Dr. Claiborne Billings on 03/18/17 @ 11:40am. Records given to [facility]. aib

## 2017-01-12 DIAGNOSIS — H903 Sensorineural hearing loss, bilateral: Secondary | ICD-10-CM | POA: Diagnosis not present

## 2017-02-06 ENCOUNTER — Other Ambulatory Visit: Payer: Self-pay | Admitting: Cardiovascular Disease

## 2017-03-18 ENCOUNTER — Ambulatory Visit (INDEPENDENT_AMBULATORY_CARE_PROVIDER_SITE_OTHER): Payer: Commercial Managed Care - HMO | Admitting: Cardiovascular Disease

## 2017-03-18 VITALS — BP 146/74 | HR 58 | Ht 64.0 in | Wt 169.0 lb

## 2017-03-18 DIAGNOSIS — Z789 Other specified health status: Secondary | ICD-10-CM | POA: Diagnosis not present

## 2017-03-18 DIAGNOSIS — I10 Essential (primary) hypertension: Secondary | ICD-10-CM | POA: Diagnosis not present

## 2017-03-18 DIAGNOSIS — I2510 Atherosclerotic heart disease of native coronary artery without angina pectoris: Secondary | ICD-10-CM | POA: Diagnosis not present

## 2017-03-18 DIAGNOSIS — E785 Hyperlipidemia, unspecified: Secondary | ICD-10-CM | POA: Diagnosis not present

## 2017-03-18 NOTE — Patient Instructions (Signed)

## 2017-03-18 NOTE — Progress Notes (Signed)
Patient ID: Jared Dunlap, male   DOB: 1932-02-14, 83 y.o.   MRN: [id number]    Primary M.D.: Dr.Ehinger  HPI: Jared Dunlap is a 83 y.o. male who presents to the office for a 13 month cardiology evaluation. Jared Dunlap has known CAD and in August 1998 underwent CABG surgery x6 with LIMA to the LAD, saphenous vein graft to diagonal, sequential vein graft to the OM1-OM2, and vein graft to the right coronary artery.  In 2001 a Cardiolite study which was done for routine followup evaluation when the patient was asymptomatic demonstrated new circumflex ischemia. Catheterization revealed total occlusion of the graft to the marginal 1 vessel but he did have a patent sequential limb from OM1-OM 2. There was a 95% native circumflex stenosis  .  At that time, I successfully performed PCI/stenting of the native circumflex vessel with a 3.5x15 mm bare metal  NIR stent. Subsequent nuclear perfusion studies has continued to show normal fairly normal perfusion. His last nuclear perfusion study was done in May 2014 which wasshowed mild apical thinning and basal lateral defect without associated ischemia.  Jared Dunlap is remaining active. He plays golf at least 3 days per week. He exercises at the gym at least 3 days per week both doing aerobics as well as weights. He remains active doing yard work as well.   He has a history of hyperlipidemia and hypertension.  He has been maintained on benazepril 40 mg, Toprol 25 mg for blood pressure control.  He has tolerated atorvastatin 20 mg in addition to fish oil for his hyperlipidemia.  He has had difficulty tolerating Crestor, Zetia, simvastatin, and WelChol, and has been only able to take low-dose atorvastatin with coenzyme Q10 for tolerability.  He sees Dr. Rosana Hoes for primary care.  Laboratory in 2015 demonstrated elevated lipids with a total cholesterol 215 LDL cholesterol 156, triglyceride level at 112, HDL 37, and non-HDL cholesterol 178.  BUN was 28, creatinine  1.0.  Jared Dunlap has continued to be active.  He plays golf at least 2-3 times per week, he exercises regularly.  In July 2016 a follow-up nuclear perfusion study showed an ejection fraction at 47% and there was mild basal inferoseptal hypokinesis.  There was no evidence for any ischemia.  In July 2016.  He developed bilateral nonobstructive nephro lithiasis.  Kidney stones were removed by Dr. Tresa Moore.  He recently changed primary care providers and saw Dr. Herbie Baltimore anger and equal.  Previously had seen Dr. Rosana Hoes  His last laboratory in March 2018 showed a total cholesterol 207, triglycerides 58, HDL 35, LDL 160, and non-HDL cholesterol 172.  April lipoprotein B was elevated at 103.  Jared Dunlap has been intolerant to all statins including Zocor, Pravachol, Crestor, atorvastatin, and previously also used Baycol and WelChol.  When he was recently seen by Dr.Ehinger and was recommended that he see me back to further discuss PCSK9 inhibitor therapy and possible initiation of treatment.  Past Medical History:  Diagnosis Date  . Coronary artery disease    followed by Dr. Claiborne Billings - last office visit 06/08/13  . History of kidney stones   . Hypertension   . MI (myocardial infarction) 1998    x2 MI's    Past Surgical History:  Procedure Laterality Date  . BYPASS GRAFT  1998  . CORONARY ARTERY BYPASS GRAFT  1998   6 vessels  . CYSTOSCOPY WITH RETROGRADE PYELOGRAM, URETEROSCOPY AND STENT PLACEMENT Right 05/11/2013   Procedure: CYSTOSCOPY WITH RIGHT RETROGRADE PYELOGRAM,CYSTOLITHOPAXY/DIAGNOSTIC  DIGITAL  FLEXIBLE RIGHT URETEROSCOPY WITH STENT PLACEMENT;  Surgeon: Alexis Frock, MD;  Location: [facility];  Service: Urology;  Laterality: Right;  . CYSTOSCOPY WITH RETROGRADE PYELOGRAM, URETEROSCOPY AND STENT PLACEMENT Right 06/10/2013   Procedure: CYSTOSCOPY WITH RETROGRADE PYELOGRAM, URETEROSCOPY AND STENT PLACEMENT;  Surgeon: Alexis Frock, MD;  Location: [facility];  Service: Urology;  Laterality: Right;  .  INNER EAR SURGERY    . JOINT REPLACEMENT     Left hip  . KIDNEY STONE SURGERY  1966  . stented coronary artery  1999  . TONSILLECTOMY      Allergies  Allergen Reactions  . Crestor [Rosuvastatin] Other (See Comments)    UNSPECIFIED  . Welchol [Colesevelam Hcl] Other (See Comments)    UNSPECIFIED  . Zetia [Ezetimibe] Other (See Comments)    UNSPECIFIED  . Zocor [Simvastatin] Other (See Comments)    unspecified    Current Outpatient Prescriptions  Medication Sig Dispense Refill  . benazepril (LOTENSIN) 40 MG tablet TAKE 1 TABLET EVERY DAY 90 tablet 3  . Coenzyme Q10 (COQ10) 100 MG CAPS Take 1 tablet by mouth daily.     . Flaxseed, Linseed, (FLAX SEEDS PO) Take 1 tablet by mouth daily.     . folic acid (FOLVITE) 854 MCG tablet Take 1 tablet (400 mcg total) by mouth daily. 90 tablet 3  . Glucosamine-Chondroitin (OSTEO BI-FLEX REGULAR STRENGTH PO) Take 1 tablet by mouth daily.    . Lecithin 1200 MG CAPS Take 1,200 mg by mouth daily.    . metoprolol tartrate (LOPRESSOR) 25 MG tablet TAKE 1/2 TABLET TWICE DAILY 120 tablet 0  . pyridOXINE (B-6) 50 MG tablet Take 50 mg by mouth daily.    Marland Kitchen terazosin (HYTRIN) 5 MG capsule Take 5 mg by mouth at bedtime.    . vitamin B-12 (CYANOCOBALAMIN) 500 MCG tablet Take 500 mcg by mouth daily.    . vitamin C (ASCORBIC ACID) 500 MG tablet Take 500 mg by mouth daily.     No current facility-administered medications for this visit.     Social History   Social History  . Marital status: Married    Spouse name: N/A  . Number of children: N/A  . Years of education: N/A   Occupational History  . Not on file.   Social History Main Topics  . Smoking status: Former Smoker    Types: Cigarettes    Quit date: 04/19/1963  . Smokeless tobacco: Never Used  . Alcohol use No  . Drug use: No  . Sexual activity: No   Other Topics Concern  . Not on file   Social History Narrative  . No narrative on file    Family History  Problem Relation Age of  Onset  . Heart disease Mother   . Heart disease Father   . Heart disease Brother   . Cancer Brother   . Heart disease Brother   . Kidney Stones Brother    Socially he is married for 48 years. 3 children 4 grandchildren 3 great-grandchildren. There is no alcohol use. He quit tobacco in 1965.  ROS General: Negative; No fevers, chills, or night sweats;  HEENT: Negative; No changes in vision or hearing, sinus congestion, difficulty swallowing Pulmonary: Negative; No cough, wheezing, shortness of breath, hemoptysis Cardiovascular: See history of present illness; No chest pain, presyncope, syncope, palpitations GI: Negative; No nausea, vomiting, diarrhea, or abdominal pain GU: Negative; No dysuria, hematuria, or difficulty voiding Musculoskeletal: Negative; no myalgias, joint pain, or weakness Hematologic/Oncology: Negative; no easy bruising, bleeding  Endocrine: Negative; no heat/cold intolerance; no diabetes Neuro: Negative; no changes in balance, headaches Skin: Negative; No rashes or skin lesions Psychiatric: Negative; No behavioral problems, depression Sleep: Negative; No snoring, daytime sleepiness, hypersomnolence, bruxism, restless legs, hypnogognic hallucinations, no cataplexy Other comprehensive 14 point system review is negative.  PE BP (!) 146/74   Pulse (!) 58   Ht '5\' 4"'  (1.626 m)   Wt 169 lb (76.7 kg)   BMI 29.01 kg/m   Repeat BP 138/76  Wt Readings from Last 3 Encounters:  03/18/17 169 lb (76.7 kg)  02/04/16 178 lb (80.7 kg)  01/10/15 181 lb (82.1 kg)   General: Alert, oriented, no distress.  Skin: normal turgor, no rashes, warm and dry HEENT: Normocephalic, atraumatic. Pupils equal round and reactive to light; sclera anicteric; extraocular muscles intact;  Nose without nasal septal hypertrophy Mouth/Parynx benign; Mallinpatti scale 3 Neck: No JVD, no carotid bruits; normal carotid upstroke Lungs: clear to ausculatation and percussion; no wheezing or rales Chest  wall: without tenderness to palpitation Heart: PMI not displaced, RRR, s1 s2 normal, 1/6 systolic murmur, no diastolic murmur, no rubs, gallops, thrills, or heaves Abdomen: soft, nontender; no hepatosplenomehaly, BS+; abdominal aorta nontender and not dilated by palpation. Back: no CVA tenderness Pulses 2+ Musculoskeletal: full range of motion, normal strength, no joint deformities Extremities: Trivial ankle edema; no clubbing cyanosis, Homan's sign negative  Neurologic: grossly nonfocal; Cranial nerves grossly wnl Psychologic: Normal mood and affect   ECG (independently read by me): sinus bradycardia 58 bpm.  Inferior Q waves in 3 and aVF, ST abnormality in 1 and aVL.  August 2017 ECG (independently read by me): Normal sinus rhythm at 64 bpm.  Inferior Q waves in 3 and aVF.  October 2015 ECG (independently read by me): Sinus bradycardia 55 beats per minute.  Nonspecific ST changes.  Prior October 2014 ECG: Normal sinus rhythm. QTc interval 399 ms.  LABS: BMP Latest Ref Rng & Units 01/17/2015 01/04/2015 05/03/2014  Glucose 65 - 99 mg/dL 107(H) 95 104(H)  BUN 6 - 20 mg/dL 17 29(H) 20  Creatinine 0.61 - 1.24 mg/dL 1.16 1.07 1.07  Sodium 135 - 145 mmol/L 139 143 140  Potassium 3.5 - 5.1 mmol/L 4.1 4.5 4.4  Chloride 101 - 111 mmol/L 106 108 104  CO2 22 - 32 mmol/L '26 27 26  ' Calcium 8.9 - 10.3 mg/dL 9.2 8.5 8.8   Hepatic Function Latest Ref Rng & Units 02/07/2015 01/17/2015 01/04/2015  Total Protein 6.1 - 8.1 g/dL 6.3 6.1(L) 6.3  Albumin 3.6 - 5.1 g/dL 3.7 3.5 3.7  AST 10 - 35 U/L 37(H) 30 51(H)  ALT 9 - 46 U/L 29 22 60(H)  Alk Phosphatase 40 - 115 U/L 63 63 64  Total Bilirubin 0.2 - 1.2 mg/dL 0.4 0.6 0.5  Bilirubin, Direct <=0.2 mg/dL 0.1 - -   CBC Latest Ref Rng & Units 01/17/2015 01/04/2015 05/03/2014  WBC 4.0 - 10.5 K/uL 6.5 6.5 7.7  Hemoglobin 13.0 - 17.0 g/dL 13.4 13.3 13.9  Hematocrit 39.0 - 52.0 % 39.2 41.2 40.1  Platelets 150 - 400 K/uL 151 141(L) 141(L)   Lab Results  Component  Value Date   MCV 92.5 01/17/2015   MCV 94.9 01/04/2015   MCV 90.9 05/03/2014   Lab Results  Component Value Date   TSH 3.997 02/07/2015   Lipid Panel  No results found for: CHOL, TRIG, HDL, CHOLHDL, VLDL, LDLCALC, LDLDIRECT   RADIOLOGY: No results found.  IMPRESSION:  1. CAD in native artery  2. Essential hypertension   3. Hyperlipidemia with target LDL less than 70   4. Statin intolerance     ASSESSMENT AND PLAN: Jared Dunlap is an 83 year old Caucasian male who underwent CABG surgery x6 in August 1998. In 2001 he was entirely asymptomatic but a nuclear perfusion study demonstrated a large area of ischemia laterally and at that time cardiac catheterization revealed an occluded graft that supplied the circumflex marginal vessel. I performed a successful percutaneous coronary intervention to his native LCx. Subsequently, he has continued to do well. He remains very active and denies anginal symptoms.  He has a history of significant hyperlipidemia and has been not able to tolerate multiple statin drugs.  Remotely he had tolerated some but could not tolerate higher dose.  Recently he's been off all statin medication and states he cannot tolerate any one of them with the dissection with the exception in the past that he did tolerate Baycol before was taken off the market.  I had a very lengthy discussion with him for over 30 minutes concerning the benefits of PCSK9 inhibition therapy.  I reviewed with him at length both regression trial data, as well as outcome data.  In the past he's always had concern of the cost of co-pays.  After much discussion, he now finally agrees to at least have [place] attempt approval of the medication.  He hopefully will be able to get some support since he is on Medicare and typically co-pays are elevated.  His blood pressure today is controlled.  Apparently he has not been taking aspirin.  I've suggested that he take 81 mg daily.  We will initiate the process  for approval.  He continues to be active, golfing at least 3-4 days per week, doing yard work, in addition to going to the gym.  I will see him in 6 months for reevaluation.   Time spent: 35 minutes Troy Sine, MD, [facility]  03/20/2017 7:28 PM

## 2017-03-20 ENCOUNTER — Encounter: Payer: Self-pay | Admitting: Cardiovascular Disease

## 2017-04-07 ENCOUNTER — Telehealth: Payer: Self-pay | Admitting: Pharmacist Clinician (PhC)/ Clinical Pharmacy Specialist

## 2017-04-07 ENCOUNTER — Other Ambulatory Visit: Payer: Self-pay | Admitting: Pharmacist Clinician (PhC)/ Clinical Pharmacy Specialist

## 2017-04-07 MED ORDER — EVOLOCUMAB 140 MG/ML ~~LOC~~ SOAJ: 140.0000 mg | SUBCUTANEOUS | 12 refills | Status: DC

## 2017-04-07 NOTE — Telephone Encounter (Signed)
Pulled paper chart from [facility].  Was able to determine statin history as follows:   November 2008 - February 2009 -  Crestor 5 mg qod February 2009- June 2010  - Crestor 10 mg qod (stopped due to muscle aches)  Re-challenged March 2012 with 5 mg q3d (was able to take for about 5-6 weeks before muscle aches developed)  July 2012 - pravastatin 20 mg (was able to take for 3-4 weeks before muscle aches developed)  November 2013 - Atorvastatin 20 mg (was able to take for 3-4 weeks before muscle aches developed)  Chart also notes that in 2004 patient was on Lescol 80 mg, although I cannot determine how long he took this.

## 2017-04-08 ENCOUNTER — Other Ambulatory Visit: Payer: Self-pay | Admitting: *Deleted

## 2017-04-08 MED ORDER — ATORVASTATIN CALCIUM 20 MG PO TABS: 20.0000 mg | ORAL_TABLET | Freq: Every day | ORAL | 3 refills | Status: DC

## 2017-05-12 ENCOUNTER — Other Ambulatory Visit: Payer: Self-pay | Admitting: Cardiovascular Disease

## 2017-08-26 DIAGNOSIS — H9190 Unspecified hearing loss, unspecified ear: Secondary | ICD-10-CM | POA: Diagnosis not present

## 2017-08-26 DIAGNOSIS — D692 Other nonthrombocytopenic purpura: Secondary | ICD-10-CM | POA: Diagnosis not present

## 2017-08-26 DIAGNOSIS — G720 Drug-induced myopathy: Secondary | ICD-10-CM | POA: Diagnosis not present

## 2017-08-26 DIAGNOSIS — I10 Essential (primary) hypertension: Secondary | ICD-10-CM | POA: Diagnosis not present

## 2017-08-26 DIAGNOSIS — M1990 Unspecified osteoarthritis, unspecified site: Secondary | ICD-10-CM | POA: Diagnosis not present

## 2017-08-26 DIAGNOSIS — I2510 Atherosclerotic heart disease of native coronary artery without angina pectoris: Secondary | ICD-10-CM | POA: Diagnosis not present

## 2017-08-26 DIAGNOSIS — D696 Thrombocytopenia, unspecified: Secondary | ICD-10-CM | POA: Diagnosis not present

## 2017-10-27 ENCOUNTER — Encounter: Payer: Self-pay | Admitting: Cardiovascular Disease

## 2017-10-27 ENCOUNTER — Ambulatory Visit: Payer: Medicare HMO | Admitting: Cardiovascular Disease

## 2017-10-27 VITALS — BP 144/68 | HR 73 | Ht 64.0 in | Wt 173.2 lb

## 2017-10-27 DIAGNOSIS — E785 Hyperlipidemia, unspecified: Secondary | ICD-10-CM

## 2017-10-27 DIAGNOSIS — Z79899 Other long term (current) drug therapy: Secondary | ICD-10-CM

## 2017-10-27 DIAGNOSIS — I2510 Atherosclerotic heart disease of native coronary artery without angina pectoris: Secondary | ICD-10-CM

## 2017-10-27 DIAGNOSIS — I10 Essential (primary) hypertension: Secondary | ICD-10-CM

## 2017-10-27 MED ORDER — EZETIMIBE 10 MG PO TABS: 10.0000 mg | ORAL_TABLET | Freq: Every day | ORAL | 3 refills | Status: DC

## 2017-10-27 NOTE — Patient Instructions (Signed)
Medication Instructions:  START Zetia 10 mg daily  Labwork: Please return for FASTING labs in 3 months (CMET, Lipid)  Our in office lab hours are Monday-Friday 8:00-4:00, closed for lunch 12:45-1:45 pm.  No appointment needed.  Follow-Up: Your physician wants you to follow-up in: 6 months with Dr. Kelly.  You will receive a reminder letter in the mail two months in advance. If you don't receive a letter, please call our office to schedule the follow-up appointment.   Any Other Special Instructions Will Be Listed Below (If Applicable).     If you need a refill on your cardiac medications before your next appointment, please call your pharmacy.   

## 2017-10-27 NOTE — Progress Notes (Signed)
Patient ID: Jared Dunlap, male   DOB: 04-14-1932, 84 y.o.   MRN: [id number]    Primary M.D.: Dr.Ehinger  HPI: Jared Dunlap is a 84 y.o. male who presents to the office for a 7 month cardiology evaluation.  Jared Dunlap has known CAD and in August 1998 underwent CABG surgery x6 with LIMA to the LAD, saphenous vein graft to diagonal, sequential vein graft to the OM1-OM2, and vein graft to the right coronary artery.  In 2001 a Cardiolite study which was done for routine followup evaluation when the patient was asymptomatic demonstrated new circumflex ischemia. Catheterization revealed total occlusion of the graft to the marginal 1 vessel but he did have a patent sequential limb from OM1-OM 2. There was a 95% native circumflex stenosis  .  At that time, I successfully performed PCI/stenting of the native circumflex vessel with a 3.5x15 mm bare metal  NIR stent. Subsequent nuclear perfusion studies has continued to show normal fairly normal perfusion. His last nuclear perfusion study was done in May 2014 which wasshowed mild apical thinning and basal lateral defect without associated ischemia.  Jared Dunlap is remaining active. He plays golf at least 3 days per week. He exercises at the gym at least 3 days per week both doing aerobics as well as weights. He remains active doing yard work as well.   He has a history of hyperlipidemia and hypertension.  He has been maintained on benazepril 40 mg, Toprol 25 mg for blood pressure control.  He has tolerated atorvastatin 20 mg in addition to fish oil for his hyperlipidemia.  He has had difficulty tolerating Crestor, Zetia, simvastatin, and WelChol, and has been only able to take low-dose atorvastatin with coenzyme Q10 for tolerability.  He sees Dr. Rosana Hoes for primary care.  Laboratory in 2015 demonstrated elevated lipids with a total cholesterol 215 LDL cholesterol 156, triglyceride level at 112, HDL 37, and non-HDL cholesterol 178.  BUN was 28, creatinine  1.0.  Jared Dunlap has continued to be active.  He plays golf at least 2-3 times per week, he exercises regularly.  In July 2016 a follow-up nuclear perfusion study showed an ejection fraction at 47% and there was mild basal inferoseptal hypokinesis.  There was no evidence for any ischemia.  In July 2016.  He developed bilateral nonobstructive nephro lithiasis.  Kidney stones were removed by Dr. Tresa Moore.  He  changed primary care providers and saw Dr. Gaynelle Arabian.  Previously had seen Dr. Rosana Hoes  His last laboratory in March 2018 showed a total cholesterol 207, triglycerides 58, HDL 35, LDL 160, and non-HDL cholesterol 172.  April lipoprotein B was elevated at 103.  Jared Dunlap has been intolerant to all statins including Zocor, Pravachol, Crestor, atorvastatin, and previously also used Baycol and WelChol.  When he was recently seen by Dr.Ehinger and was recommended that he see me back to further discuss PCSK9 inhibitor therapy and possible initiation of treatment.  When I saw him in September 2009 I had a long discussion with him regarding the effects of PCSK9 inhibition therapy.  I reviewed with him at length both regression trial data as well as outcome data.  Over the past 6 months, has remained asymptomatic.  He plays golf at least 3 days/week.  Today he played golf and shot his age 63.  He denies chest pain.  He continues to do yard work.  He had follow-up lab work in February 2019 by Dr. [name];  total cholesterol was 182, HDL 41, LDL  132, triglycerides 43.  He presents for evaluation.  Past Medical History:  Diagnosis Date  . Coronary artery disease    followed by Dr. Claiborne Billings - last office visit 06/08/13  . History of kidney stones   . Hypertension   . MI (myocardial infarction) ([place]) 1998    x2 MI's    Past Surgical History:  Procedure Laterality Date  . BYPASS GRAFT  1998  . CORONARY ARTERY BYPASS GRAFT  1998   6 vessels  . CYSTOSCOPY WITH RETROGRADE PYELOGRAM, URETEROSCOPY AND  STENT PLACEMENT Right 05/11/2013   Procedure: CYSTOSCOPY WITH RIGHT RETROGRADE PYELOGRAM,CYSTOLITHOPAXY/DIAGNOSTIC  DIGITAL FLEXIBLE RIGHT URETEROSCOPY WITH STENT PLACEMENT;  Surgeon: Alexis Frock, MD;  Location: [facility];  Service: Urology;  Laterality: Right;  . CYSTOSCOPY WITH RETROGRADE PYELOGRAM, URETEROSCOPY AND STENT PLACEMENT Right 06/10/2013   Procedure: CYSTOSCOPY WITH RETROGRADE PYELOGRAM, URETEROSCOPY AND STENT PLACEMENT;  Surgeon: Alexis Frock, MD;  Location: [facility];  Service: Urology;  Laterality: Right;  . INNER EAR SURGERY    . JOINT REPLACEMENT     Left hip  . KIDNEY STONE SURGERY  1966  . stented coronary artery  1999  . TONSILLECTOMY      Allergies  Allergen Reactions  . Crestor [Rosuvastatin] Other (See Comments)    UNSPECIFIED  . Welchol [Colesevelam Hcl] Other (See Comments)    UNSPECIFIED  . Zetia [Ezetimibe] Other (See Comments)    UNSPECIFIED  . Zocor [Simvastatin] Other (See Comments)    unspecified    Current Outpatient Medications  Medication Sig Dispense Refill  . benazepril (LOTENSIN) 40 MG tablet TAKE 1 TABLET EVERY DAY 90 tablet 3  . Coenzyme Q10 (COQ10) 100 MG CAPS Take 1 tablet by mouth daily.     . Flaxseed, Linseed, (FLAX SEEDS PO) Take 1 tablet by mouth daily.     . folic acid (FOLVITE) 665 MCG tablet Take 1 tablet (400 mcg total) by mouth daily. 90 tablet 3  . Glucosamine-Chondroitin (OSTEO BI-FLEX REGULAR STRENGTH PO) Take 1 tablet by mouth daily.    . Lecithin 1200 MG CAPS Take 1,200 mg by mouth daily.    . metoprolol tartrate (LOPRESSOR) 25 MG tablet TAKE 1/2 TABLET TWICE DAILY 90 tablet 3  . pyridOXINE (B-6) 50 MG tablet Take 50 mg by mouth daily.    Marland Kitchen terazosin (HYTRIN) 5 MG capsule Take 5 mg by mouth at bedtime.    . vitamin B-12 (CYANOCOBALAMIN) 500 MCG tablet Take 500 mcg by mouth daily.    . vitamin C (ASCORBIC ACID) 500 MG tablet Take 500 mg by mouth daily.    Marland Kitchen ezetimibe (ZETIA) 10 MG tablet Take 1 tablet (10 mg total) by mouth  daily. 90 tablet 3   No current facility-administered medications for this visit.     Social History   Socioeconomic History  . Marital status: Married    Spouse name: Not on file  . Number of children: Not on file  . Years of education: Not on file  . Highest education level: Not on file  Occupational History  . Not on file  Social Needs  . Financial resource strain: Not on file  . Food insecurity:    Worry: Not on file    Inability: Not on file  . Transportation needs:    Medical: Not on file    Non-medical: Not on file  Tobacco Use  . Smoking status: Former Smoker    Types: Cigarettes    Last attempt to quit: 04/19/1963    Years since  quitting: 54.5  . Smokeless tobacco: Never Used  Substance and Sexual Activity  . Alcohol use: No  . Drug use: No  . Sexual activity: Never  Lifestyle  . Physical activity:    Days per week: Not on file    Minutes per session: Not on file  . Stress: Not on file  Relationships  . Social connections:    Talks on phone: Not on file    Gets together: Not on file    Attends religious service: Not on file    Active member of club or organization: Not on file    Attends meetings of clubs or organizations: Not on file    Relationship status: Not on file  . Intimate partner violence:    Fear of current or ex partner: Not on file    Emotionally abused: Not on file    Physically abused: Not on file    Forced sexual activity: Not on file  Other Topics Concern  . Not on file  Social History Narrative  . Not on file    Family History  Problem Relation Age of Onset  . Heart disease Mother   . Heart disease Father   . Heart disease Brother   . Cancer Brother   . Heart disease Brother   . Kidney Stones Brother    Socially he is married for 26 years. 3 children 4 grandchildren 3 great-grandchildren. There is no alcohol use. He quit tobacco in 1965.  ROS General: Negative; No fevers, chills, or night sweats;  HEENT: Negative; No  changes in vision or hearing, sinus congestion, difficulty swallowing Pulmonary: Negative; No cough, wheezing, shortness of breath, hemoptysis Cardiovascular: See history of present illness; No chest pain, presyncope, syncope, palpitations GI: Negative; No nausea, vomiting, diarrhea, or abdominal pain GU: Negative; No dysuria, hematuria, or difficulty voiding Musculoskeletal: Negative; no myalgias, joint pain, or weakness Hematologic/Oncology: Negative; no easy bruising, bleeding Endocrine: Negative; no heat/cold intolerance; no diabetes Neuro: Negative; no changes in balance, headaches Skin: Negative; No rashes or skin lesions Psychiatric: Negative; No behavioral problems, depression Sleep: Negative; No snoring, daytime sleepiness, hypersomnolence, bruxism, restless legs, hypnogognic hallucinations, no cataplexy Other comprehensive 14 point system review is negative.  PE BP (!) 144/68   Pulse 73   Ht _0  (1.626 m)   Wt 173 lb 3.2 oz (78.6 kg)   BMI 29.73 kg/m    Repeat blood pressure by me was 140/70.  He states his blood pressure at home today was 118/61.  Wt Readings from Last 3 Encounters:  10/27/17 173 lb 3.2 oz (78.6 kg)  03/18/17 169 lb (76.7 kg)  02/04/16 178 lb (80.7 kg)   General: Alert, oriented, no distress.  Skin: normal turgor, no rashes, warm and dry HEENT: Normocephalic, atraumatic. Pupils equal round and reactive to light; sclera anicteric; extraocular muscles intact;  Nose without nasal septal hypertrophy Mouth/Parynx benign; Mallinpatti scale 3 Neck: No JVD, no carotid bruits; normal carotid upstroke Lungs: clear to ausculatation and percussion; no wheezing or rales Chest wall: without tenderness to palpitation Heart: PMI not displaced, RRR, s1 s2 normal, 1/6 systolic murmur, no diastolic murmur, no rubs, gallops, thrills, or heaves Abdomen: soft, nontender; no hepatosplenomehaly, BS+; abdominal aorta nontender and not dilated by palpation. Back: no CVA  tenderness Pulses 2+ Musculoskeletal: full range of motion, normal strength, no joint deformities Extremities: no clubbing cyanosis or edema, Homan's sign negative  Neurologic: grossly nonfocal; Cranial nerves grossly wnl Psychologic: Normal mood and affect   ECG (independently  read by me): Normal sinus rhythm at 73 bpm.  PVC.  Old inferior infarct.  September 2018 ECG (independently read by me): sinus bradycardia 58 bpm.  Inferior Q waves in 3 and aVF, ST abnormality in 1 and aVL.  August 2017 ECG (independently read by me): Normal sinus rhythm at 64 bpm.  Inferior Q waves in 3 and aVF.  October 2015 ECG (independently read by me): Sinus bradycardia 55 beats per minute.  Nonspecific ST changes.  Prior October 2014 ECG: Normal sinus rhythm. QTc interval 399 ms.  LABS: BMP Latest Ref Rng & Units 01/17/2015 01/04/2015 05/03/2014  Glucose 65 - 99 mg/dL 107(H) 95 104(H)  BUN 6 - 20 mg/dL 17 29(H) 20  Creatinine 0.61 - 1.24 mg/dL 1.16 1.07 1.07  Sodium 135 - 145 mmol/L 139 143 140  Potassium 3.5 - 5.1 mmol/L 4.1 4.5 4.4  Chloride 101 - 111 mmol/L 106 108 104  CO2 22 - 32 mmol/L _0 Calcium 8.9 - 10.3 mg/dL 9.2 8.5 8.8   Hepatic Function Latest Ref Rng & Units 02/07/2015 01/17/2015 01/04/2015  Total Protein 6.1 - 8.1 g/dL 6.3 6.1(L) 6.3  Albumin 3.6 - 5.1 g/dL 3.7 3.5 3.7  AST 10 - 35 U/L 37(H) 30 51(H)  ALT 9 - 46 U/L 29 22 60(H)  Alk Phosphatase 40 - 115 U/L 63 63 64  Total Bilirubin 0.2 - 1.2 mg/dL 0.4 0.6 0.5  Bilirubin, Direct <=0.2 mg/dL 0.1 - -   CBC Latest Ref Rng & Units 01/17/2015 01/04/2015 05/03/2014  WBC 4.0 - 10.5 K/uL 6.5 6.5 7.7  Hemoglobin 13.0 - 17.0 g/dL 13.4 13.3 13.9  Hematocrit 39.0 - 52.0 % 39.2 41.2 40.1  Platelets 150 - 400 K/uL 151 141(L) 141(L)   Lab Results  Component Value Date   MCV 92.5 01/17/2015   MCV 94.9 01/04/2015   MCV 90.9 05/03/2014   Lab Results  Component Value Date   TSH 3.997 02/07/2015   Lipid Panel  No results found for: CHOL,  TRIG, HDL, CHOLHDL, VLDL, LDLCALC, LDLDIRECT   RADIOLOGY: No results found.  IMPRESSION:  1. CAD in native artery   2. Hyperlipidemia with target LDL less than 70   3. Essential hypertension   4. Medication management     ASSESSMENT AND PLAN: Jared Dunlap is an 84 year old Caucasian male who underwent CABG surgery x6 in August 1998. In 2001 he was entirely asymptomatic but a nuclear perfusion study demonstrated a large area of ischemia laterally and at that time cardiac catheterization revealed an occluded graft that supplied the circumflex marginal vessel. I performed a successful percutaneous coronary intervention to his native LCx. Subsequently, he has continued to do well. He remains very active and denies anginal symptoms.  He has a history of significant hyperlipidemia and has been not able to tolerate multiple statin drugs.  Remotely he had tolerated some but could not tolerate higher dose.  When I last saw him, I spent over 30 minutes discussing recent trial data regarding both Repatha as well as Praluent.  I have also contacted our pharmacy to tentatively initiate approval particularly with the price reduction to see if he could benefit from potential assistance and she did inform me that numerous patients have been able to obtain drug at no cost.  I again had a very long discussion with him today for over 25 minutes discussing prior lipid studies, plaque reversal, outcome data, and significant additional reduction in MI and subsequent stroke.  At  present, he still seems inclined not to initiate injection therapy.  He has agreed for another retrial of ezetimibe 10 mg to see if this can further reduce his LDL.  Liver, he is aware that this will undoubtedly be inferior to the potential benefit of PCSK9 inhibition.  After this long discussion, his blood pressure was slightly increased.  His blood pressure today was normal.  He remains active without ischemic symptoms.  He denies any  significant exertional shortness of breath.  In 3 months I will repeat chemistry and lipid studies on Zetia.  I will see him in 6 months for reevaluation.  Time spent > 25 minutes Troy Sine, MD, [facility]  10/27/2017 3:35 PM

## 2018-03-09 DIAGNOSIS — I10 Essential (primary) hypertension: Secondary | ICD-10-CM | POA: Diagnosis not present

## 2018-03-09 DIAGNOSIS — Z23 Encounter for immunization: Secondary | ICD-10-CM | POA: Diagnosis not present

## 2018-03-09 DIAGNOSIS — M1990 Unspecified osteoarthritis, unspecified site: Secondary | ICD-10-CM | POA: Diagnosis not present

## 2018-03-09 DIAGNOSIS — D692 Other nonthrombocytopenic purpura: Secondary | ICD-10-CM | POA: Diagnosis not present

## 2018-03-09 DIAGNOSIS — G720 Drug-induced myopathy: Secondary | ICD-10-CM | POA: Diagnosis not present

## 2018-03-09 DIAGNOSIS — I2510 Atherosclerotic heart disease of native coronary artery without angina pectoris: Secondary | ICD-10-CM | POA: Diagnosis not present

## 2018-03-09 DIAGNOSIS — H9190 Unspecified hearing loss, unspecified ear: Secondary | ICD-10-CM | POA: Diagnosis not present

## 2018-03-09 DIAGNOSIS — D696 Thrombocytopenia, unspecified: Secondary | ICD-10-CM | POA: Diagnosis not present

## 2019-03-14 DIAGNOSIS — I10 Essential (primary) hypertension: Secondary | ICD-10-CM | POA: Diagnosis not present

## 2019-03-14 DIAGNOSIS — I2510 Atherosclerotic heart disease of native coronary artery without angina pectoris: Secondary | ICD-10-CM | POA: Diagnosis not present

## 2019-03-14 DIAGNOSIS — M1990 Unspecified osteoarthritis, unspecified site: Secondary | ICD-10-CM | POA: Diagnosis not present

## 2019-03-14 DIAGNOSIS — G720 Drug-induced myopathy: Secondary | ICD-10-CM | POA: Diagnosis not present

## 2019-03-14 DIAGNOSIS — H9190 Unspecified hearing loss, unspecified ear: Secondary | ICD-10-CM | POA: Diagnosis not present

## 2019-03-29 DIAGNOSIS — R251 Tremor, unspecified: Secondary | ICD-10-CM | POA: Diagnosis not present

## 2019-03-29 DIAGNOSIS — H0013 Chalazion right eye, unspecified eyelid: Secondary | ICD-10-CM | POA: Diagnosis not present

## 2019-03-29 DIAGNOSIS — M25511 Pain in right shoulder: Secondary | ICD-10-CM | POA: Diagnosis not present

## 2019-04-23 DIAGNOSIS — Z23 Encounter for immunization: Secondary | ICD-10-CM | POA: Diagnosis not present

## 2019-12-21 ENCOUNTER — Telehealth: Payer: Self-pay | Admitting: Cardiovascular Disease

## 2019-12-21 NOTE — Telephone Encounter (Signed)
New message   Patient's wife will be coming to appt on 12/26/2019. Patient is hearing impaired.

## 2019-12-21 NOTE — Telephone Encounter (Signed)
noted 

## 2019-12-26 ENCOUNTER — Other Ambulatory Visit: Payer: Self-pay

## 2019-12-26 ENCOUNTER — Encounter: Payer: Self-pay | Admitting: Cardiovascular Disease

## 2019-12-26 ENCOUNTER — Ambulatory Visit: Payer: Medicare HMO | Admitting: Cardiovascular Disease

## 2019-12-26 VITALS — BP 140/78 | HR 65 | Ht 64.0 in | Wt 169.8 lb

## 2019-12-26 DIAGNOSIS — I2510 Atherosclerotic heart disease of native coronary artery without angina pectoris: Secondary | ICD-10-CM

## 2019-12-26 DIAGNOSIS — I10 Essential (primary) hypertension: Secondary | ICD-10-CM | POA: Diagnosis not present

## 2019-12-26 DIAGNOSIS — I25708 Atherosclerosis of coronary artery bypass graft(s), unspecified, with other forms of angina pectoris: Secondary | ICD-10-CM | POA: Diagnosis not present

## 2019-12-26 DIAGNOSIS — E785 Hyperlipidemia, unspecified: Secondary | ICD-10-CM

## 2019-12-26 NOTE — Patient Instructions (Addendum)
Medication Instructions:  CONTINUE WITH CURRENT MEDICATIONS. NO CHANGES.  *If you need a refill on your cardiac medications before your next appointment, please call your pharmacy*   Lab Work: Fasting labs: cmet Cbc tsh Lipid  If you have labs (blood work) drawn today and your tests are completely normal, you will receive your results only by: Marland Kitchen MyChart Message (if you have MyChart) OR . A paper copy in the mail If you have any lab test that is abnormal or we need to change your treatment, we will call you to review the results.   Testing/Procedures: Your physician has requested that you have en exercise stress myoview. For further information please visit [email or web address]. Please follow instruction sheet, as given.- Hold your Metoprolol prior to your procedure that day. [facility] has requested that you have an echocardiogram. Echocardiography is a painless test that uses sound waves to create images of your heart. It provides your doctor with information about the size and shape of your heart and how well your heart's chambers and valves are working. This procedure takes approximately one hour. There are no restrictions for this procedure.  [address]   Follow-Up: At [facility], you and your health needs are our priority.  As part of our continuing mission to provide you with exceptional heart care, we have created designated Provider Care Teams.  These Care Teams include your primary Cardiologist (physician) and Advanced Practice Providers (APPs -  Physician Assistants and Nurse Practitioners) who all work together to provide you with the care you need, when you need it.  We recommend signing up for the patient portal called "MyChart".  Sign up information is provided on this After Visit Summary.  MyChart is used to connect with patients for Virtual Visits (Telemedicine).  Patients are able to view lab/test results, encounter notes,  upcoming appointments, etc.  Non-urgent messages can be sent to your provider as well.   To learn more about what you can do with MyChart, go to [email or web address].    Your next appointment:   3 month(s)  The format for your next appointment:   In Person  Provider:   Shelva Majestic, MD

## 2019-12-26 NOTE — Progress Notes (Signed)
Patient ID: Jared Dunlap, male   DOB: [birth year], 86 y.o.   MRN: [id number]    Primary M.D.: Dr.Ehinger  HPI: Jared Dunlap is a 86 y.o. male who presents to the office for a 60 month cardiology evaluation.  Jared Dunlap has known CAD and in August 1998 underwent CABG surgery x6 with LIMA to the LAD, saphenous vein graft to diagonal, sequential vein graft to the OM1-OM2, and vein graft to the right coronary artery.  In 2001 a Cardiolite study which was done for routine followup evaluation when the patient was asymptomatic demonstrated new circumflex ischemia. Catheterization revealed total occlusion of the graft to the marginal 1 vessel but he did have a patent sequential limb from OM1-OM 2. There was a 95% native circumflex stenosis  .  At that time, I successfully performed PCI/stenting of the native circumflex vessel with a 3.5x15 mm bare metal  NIR stent. Subsequent nuclear perfusion studies has continued to show normal fairly normal perfusion. His last nuclear perfusion study was done in May 2014 which wasshowed mild apical thinning and basal lateral defect without associated ischemia.  JaredDunlap is remaining active. He plays golf at least 3 days per week. He exercises at the gym at least 3 days per week both doing aerobics as well as weights. He remains active doing yard work as well.   He has a history of hyperlipidemia and hypertension.  He has been maintained on benazepril 40 mg, Toprol 25 mg for blood pressure control.  He has tolerated atorvastatin 20 mg in addition to fish oil for his hyperlipidemia.  He has had difficulty tolerating Crestor, Zetia, simvastatin, and WelChol, and has been only able to take low-dose atorvastatin with coenzyme Q10 for tolerability.  He sees Dr. Rosana Hoes for primary care.  Laboratory in 2015 demonstrated elevated lipids with a total cholesterol 215 LDL cholesterol 156, triglyceride level at 112, HDL 37, and non-HDL cholesterol 178.  BUN was 28, creatinine  1.0.  Jared Dunlap has continued to be active.  He plays golf at least 2-3 times per week, he exercises regularly.  In July 2016 a follow-up nuclear perfusion study showed an ejection fraction at 47% and there was mild basal inferoseptal hypokinesis.  There was no evidence for any ischemia.  In July 2016.  He developed bilateral nonobstructive nephro lithiasis.  Kidney stones were removed by Dr. Tresa Moore.  He  changed primary care providers and saw Dr. Gaynelle Arabian.  Previously had seen Dr. Rosana Hoes  His last laboratory in March 2018 showed a total cholesterol 207, triglycerides 58, HDL 35, LDL 160, and non-HDL cholesterol 172.  April lipoprotein B was elevated at 103.  Jared Dunlap has been intolerant to all statins including Zocor, Pravachol, Crestor, atorvastatin, and previously also used Baycol and WelChol.  When he was recently seen by Dr.Ehinger and was recommended that he see me back to further discuss PCSK9 inhibitor therapy and possible initiation of treatment.  When I saw him in September 2018 I had a long discussion with him regarding the effects of PCSK9 inhibition therapy.  I reviewed with him at length both regression trial data as well as outcome data.  Over the past 6 months, has remained asymptomatic.  He plays golf at least 3 days/week.  Today he played golf and shot his age 45.  He denies chest pain.  He continues to do yard work.  He had follow-up lab work in February 2019 by Dr. [name];  total cholesterol was 182, HDL 41, LDL  132, triglycerides 43.  I last saw him in April 2019.  Over the past several years he has continued to be asymptomatic without recurrent chest pain symptomatology.  He is now 87 years old and still plays golf 3 days/week.  He has not had recent laboratory.  He has not seen his primary physician in some time.  He denies significant shortness of breath.  He still does weed eating on his yard.  He states his blood pressure at home consistently is in the 983J with  diastolics in the 82N.  Both he and his wife take their blood pressures on a daily basis.  He presents for evaluation.  Past Medical History:  Diagnosis Date  . CAD (coronary artery disease) of artery bypass graft 04/18/2013   CABG x 6 1998; PCI to native LCx 2001   . CAD in native artery 02/05/2016  . Coronary artery disease    followed by Dr. Claiborne Billings - last office visit 06/08/13  . Essential hypertension 04/18/2013  . History of kidney stones   . Hyperlipidemia with target LDL less than 70 04/18/2013  . Hypertension   . MI (myocardial infarction) ([place]) 1998    x2 MI's    Past Surgical History:  Procedure Laterality Date  . BYPASS GRAFT  1998  . CORONARY ARTERY BYPASS GRAFT  1998   6 vessels  . CYSTOSCOPY WITH RETROGRADE PYELOGRAM, URETEROSCOPY AND STENT PLACEMENT Right 05/11/2013   Procedure: CYSTOSCOPY WITH RIGHT RETROGRADE PYELOGRAM,CYSTOLITHOPAXY/DIAGNOSTIC  DIGITAL FLEXIBLE RIGHT URETEROSCOPY WITH STENT PLACEMENT;  Surgeon: Alexis Frock, MD;  Location: [facility];  Service: Urology;  Laterality: Right;  . CYSTOSCOPY WITH RETROGRADE PYELOGRAM, URETEROSCOPY AND STENT PLACEMENT Right 06/10/2013   Procedure: CYSTOSCOPY WITH RETROGRADE PYELOGRAM, URETEROSCOPY AND STENT PLACEMENT;  Surgeon: Alexis Frock, MD;  Location: [facility];  Service: Urology;  Laterality: Right;  . INNER EAR SURGERY    . JOINT REPLACEMENT     Left hip  . KIDNEY STONE SURGERY  1966  . stented coronary artery  1999  . TONSILLECTOMY      Allergies  Allergen Reactions  . Crestor [Rosuvastatin] Other (See Comments)    UNSPECIFIED  . Welchol [Colesevelam Hcl] Other (See Comments)    UNSPECIFIED  . Zetia [Ezetimibe] Other (See Comments)    UNSPECIFIED  . Zocor [Simvastatin] Other (See Comments)    unspecified    Current Outpatient Medications  Medication Sig Dispense Refill  . benazepril (LOTENSIN) 40 MG tablet TAKE 1 TABLET EVERY DAY 90 tablet 3  . Coenzyme Q10 (COQ10) 100 MG CAPS Take 1 tablet by mouth  daily.     . Flaxseed, Linseed, (FLAX SEEDS PO) Take 1 tablet by mouth daily.     . folic acid (FOLVITE) 053 MCG tablet Take 1 tablet (400 mcg total) by mouth daily. 90 tablet 3  . Glucosamine-Chondroitin (OSTEO BI-FLEX REGULAR STRENGTH PO) Take 1 tablet by mouth daily.    . Lecithin 1200 MG CAPS Take 1,200 mg by mouth daily.    . metoprolol tartrate (LOPRESSOR) 25 MG tablet TAKE 1/2 TABLET TWICE DAILY 90 tablet 3  . primidone (MYSOLINE) 50 MG tablet 50 mg.    . pyridOXINE (B-6) 50 MG tablet Take 50 mg by mouth daily.    Marland Kitchen terazosin (HYTRIN) 5 MG capsule Take 5 mg by mouth at bedtime.    . vitamin B-12 (CYANOCOBALAMIN) 500 MCG tablet Take 500 mcg by mouth daily.    . vitamin C (ASCORBIC ACID) 500 MG tablet Take 500 mg by mouth  daily.     No current facility-administered medications for this visit.    Social History   Socioeconomic History  . Marital status: Married    Spouse name: Not on file  . Number of children: Not on file  . Years of education: Not on file  . Highest education level: Not on file  Occupational History  . Not on file  Tobacco Use  . Smoking status: Former Smoker    Types: Cigarettes    Quit date: 04/19/1963    Years since quitting: 56.7  . Smokeless tobacco: Never Used  Substance and Sexual Activity  . Alcohol use: No  . Drug use: No  . Sexual activity: Never  Other Topics Concern  . Not on file  Social History Narrative  . Not on file   Social Determinants of Health   Financial Resource Strain:   . Difficulty of Paying Living Expenses:   Food Insecurity:   . Worried About Charity fundraiser in the Last Year:   . Arboriculturist in the Last Year:   Transportation Needs:   . Film/video editor (Medical):   Marland Kitchen Lack of Transportation (Non-Medical):   Physical Activity:   . Days of Exercise per Week:   . Minutes of Exercise per Session:   Stress:   . Feeling of Stress :   Social Connections:   . Frequency of Communication with Friends and  Family:   . Frequency of Social Gatherings with Friends and Family:   . Attends Religious Services:   . Active Member of Clubs or Organizations:   . Attends Archivist Meetings:   Marland Kitchen Marital Status:   Intimate Partner Violence:   . Fear of Current or Ex-Partner:   . Emotionally Abused:   Marland Kitchen Physically Abused:   . Sexually Abused:     Family History  Problem Relation Age of Onset  . Heart disease Mother   . Heart disease Father   . Heart disease Brother   . Cancer Brother   . Heart disease Brother   . Kidney Stones Brother    Socially he is married for 61 years. 3 children 4 grandchildren 3 great-grandchildren. There is no alcohol use. He quit tobacco in 1965.  ROS General: Negative; No fevers, chills, or night sweats;  HEENT: Negative; No changes in vision or hearing, sinus congestion, difficulty swallowing Pulmonary: Negative; No cough, wheezing, shortness of breath, hemoptysis Cardiovascular: See history of present illness; No chest pain, presyncope, syncope, palpitations GI: Negative; No nausea, vomiting, diarrhea, or abdominal pain GU: Negative; No dysuria, hematuria, or difficulty voiding Musculoskeletal: Negative; no myalgias, joint pain, or weakness Hematologic/Oncology: Negative; no easy bruising, bleeding Endocrine: Negative; no heat/cold intolerance; no diabetes Neuro: Negative; no changes in balance, headaches Skin: Negative; No rashes or skin lesions Psychiatric: Negative; No behavioral problems, depression Sleep: Negative; No snoring, daytime sleepiness, hypersomnolence, bruxism, restless legs, hypnogognic hallucinations, no cataplexy Other comprehensive 14 point system review is negative.  PE BP 140/78   Pulse 65   Ht _0  (1.626 m)   Wt 169 lb 12.8 oz (77 kg)   SpO2 97%   BMI 29.15 kg/m    Repeat blood pressure by me was 140/76.  Daily blood pressures at home range in the 120s over 60s  Wt Readings from Last 3 Encounters:  12/26/19 169 lb  12.8 oz (77 kg)  10/27/17 173 lb 3.2 oz (78.6 kg)  03/18/17 169 lb (76.7 kg)   General: Alert, oriented,  no distress.  Skin: normal turgor, no rashes, warm and dry HEENT: Normocephalic, atraumatic. Pupils equal round and reactive to light; sclera anicteric; extraocular muscles intact;  Nose without nasal septal hypertrophy Mouth/Parynx benign; Mallinpatti scale 3 Neck: No JVD, no carotid bruits; normal carotid upstroke Lungs: clear to ausculatation and percussion; no wheezing or rales Chest wall: without tenderness to palpitation Heart: PMI not displaced, RRR, s1 s2 normal, 2/6 systolic murmur, no diastolic murmur, no rubs, gallops, thrills, or heaves Abdomen: soft, nontender; no hepatosplenomehaly, BS+; abdominal aorta nontender and not dilated by palpation. Back: no CVA tenderness Pulses 2+ Musculoskeletal: full range of motion, normal strength, no joint deformities Extremities: no clubbing cyanosis or edema, Homan's sign negative  Neurologic: grossly nonfocal; Cranial nerves grossly wnl Psychologic: Normal mood and affect  ECG (independently read by me): Normal sinus rhythm at 65 bpm.  1 isolated PVC.  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  April 2019 ECG (independently read by me): Normal sinus rhythm at 73 bpm.  PVC.  Old inferior infarct.  September 2018 ECG (independently read by me): sinus bradycardia 58 bpm.  Inferior Q waves in 3 and aVF, ST abnormality in 1 and aVL.  August 2017 ECG (independently read by me): Normal sinus rhythm at 64 bpm.  Inferior Q waves in 3 and aVF.  October 2015 ECG (independently read by me): Sinus bradycardia 55 beats per minute.  Nonspecific ST changes.  Prior October 2014 ECG: Normal sinus rhythm. QTc interval 399 ms.  LABS: BMP Latest Ref Rng & Units 01/17/2015 01/04/2015 05/03/2014  Glucose 65 - 99 mg/dL 107(H) 95 104(H)  BUN 6 - 20 mg/dL 17 29(H) 20  Creatinine 0.61 - 1.24 mg/dL 1.16 1.07 1.07  Sodium 135 - 145 mmol/L 139 143 140    Potassium 3.5 - 5.1 mmol/L 4.1 4.5 4.4  Chloride 101 - 111 mmol/L 106 108 104  CO2 22 - 32 mmol/L _0 Calcium 8.9 - 10.3 mg/dL 9.2 8.5 8.8   Hepatic Function Latest Ref Rng & Units 02/07/2015 01/17/2015 01/04/2015  Total Protein 6.1 - 8.1 g/dL 6.3 6.1(L) 6.3  Albumin 3.6 - 5.1 g/dL 3.7 3.5 3.7  AST 10 - 35 U/L 37(H) 30 51(H)  ALT 9 - 46 U/L 29 22 60(H)  Alk Phosphatase 40 - 115 U/L 63 63 64  Total Bilirubin 0.2 - 1.2 mg/dL 0.4 0.6 0.5  Bilirubin, Direct <=0.2 mg/dL 0.1 - -   CBC Latest Ref Rng & Units 01/17/2015 01/04/2015 05/03/2014  WBC 4.0 - 10.5 K/uL 6.5 6.5 7.7  Hemoglobin 13.0 - 17.0 g/dL 13.4 13.3 13.9  Hematocrit 39 - 52 % 39.2 41.2 40.1  Platelets 150 - 400 K/uL 151 141(L) 141(L)   Lab Results  Component Value Date   MCV 92.5 01/17/2015   MCV 94.9 01/04/2015   MCV 90.9 05/03/2014   Lab Results  Component Value Date   TSH 3.997 02/07/2015   Lipid Panel  No results found for: CHOL, TRIG, HDL, CHOLHDL, VLDL, LDLCALC, LDLDIRECT   RADIOLOGY: No results found.  IMPRESSION:  1. Coronary artery disease involving coronary bypass graft of native heart with other forms of angina pectoris ([place])   2. Essential hypertension   3. CAD in native artery   4. Hyperlipidemia with target LDL less than 70     ASSESSMENT AND PLAN: Jared. Zachery Niswander is an 86 year old Caucasian male who underwent CABG surgery x6 in August 1998. In 2001 he was entirely asymptomatic  but a nuclear perfusion study demonstrated a large area of ischemia laterally and at that time cardiac catheterization revealed an occluded graft that supplied the circumflex marginal vessel. I performed a successful percutaneous coronary intervention to his native LCx.  Subsequently, Jared. Wissner has continued to do well.  He remains active and plays golf at least 2-3 times per week regularly.  He states his blood pressures have been consistently in the 120s over 60s but on assessment by me today blood pressure was 140/76.   He states he has been taking his blood pressure on a daily basis and his wife confirmed this.  As result I will not further increase his medical regimen.  He continues to be on benazepril 40 mg daily and metoprolol tartrate 12.5 mg twice a day in addition to Terazosin 5 mg daily.  He has a history of significant hyperlipidemia and has been unable to tolerate multiple doses of statins.  During her prior evaluation I spent considerable time discussing PCSK9 inhibition with Repatha or Praluent.  He ultimately deferred against pursuing this option.  He is now 23 years since his CABG revascularization and 5 years since his last nuclear stress test.  With his cardiac murmur suggesting an aortic murmur I am recommending he undergo a 2D echo Doppler study for further assessment of systolic and diastolic function as well as valvular architecture.  I also have recommended a 5-year follow-up nuclear perfusion study to be done with exercise to make certain there is no significant change from his 2016 study.  Again remotely he was asymptomatic and had significant ischemia noted on a stress test which led to subsequent successful percutaneous coronary intervention.  He is not fasting today.  He is no longer taking Zetia and is not on any statin therapy.  I will schedule him for fasting labs.  The above studies will be done over the next several months.  I will see him in 3 months for reevaluation.   Troy Sine, MD, [facility]  12/26/2019 9:39 AM

## 2019-12-28 ENCOUNTER — Ambulatory Visit (HOSPITAL_COMMUNITY): Payer: Medicare HMO | Attending: Cardiovascular Disease

## 2019-12-28 ENCOUNTER — Other Ambulatory Visit: Payer: Self-pay

## 2019-12-28 ENCOUNTER — Encounter: Payer: Self-pay | Admitting: Cardiovascular Disease

## 2019-12-28 DIAGNOSIS — I25708 Atherosclerosis of coronary artery bypass graft(s), unspecified, with other forms of angina pectoris: Secondary | ICD-10-CM | POA: Insufficient documentation

## 2019-12-28 DIAGNOSIS — I10 Essential (primary) hypertension: Secondary | ICD-10-CM | POA: Insufficient documentation

## 2019-12-28 DIAGNOSIS — E785 Hyperlipidemia, unspecified: Secondary | ICD-10-CM | POA: Insufficient documentation

## 2019-12-28 DIAGNOSIS — I2510 Atherosclerotic heart disease of native coronary artery without angina pectoris: Secondary | ICD-10-CM | POA: Diagnosis not present

## 2019-12-30 ENCOUNTER — Telehealth (HOSPITAL_COMMUNITY): Payer: Self-pay

## 2019-12-30 NOTE — Telephone Encounter (Signed)
Encounter complete. 

## 2020-01-04 ENCOUNTER — Ambulatory Visit (HOSPITAL_COMMUNITY)
Admission: RE | Admit: 2020-01-04 | Payer: Medicare HMO | Source: Ambulatory Visit | Attending: Cardiovascular Disease | Admitting: Cardiovascular Disease

## 2020-01-06 ENCOUNTER — Telehealth: Payer: Self-pay | Admitting: Cardiovascular Disease

## 2020-01-06 NOTE — Telephone Encounter (Signed)
Wife of the patient called and wanted to make sure her Husband's Stress Test 02/01/20 is still covered by their insurance. Please advise

## 2020-01-09 NOTE — Telephone Encounter (Signed)
Returned call to Mrs. Hunley and advised that his test was already authorized and it should be covered.

## 2020-01-18 DIAGNOSIS — I25708 Atherosclerosis of coronary artery bypass graft(s), unspecified, with other forms of angina pectoris: Secondary | ICD-10-CM | POA: Diagnosis not present

## 2020-01-18 DIAGNOSIS — I2510 Atherosclerotic heart disease of native coronary artery without angina pectoris: Secondary | ICD-10-CM | POA: Diagnosis not present

## 2020-01-18 DIAGNOSIS — E785 Hyperlipidemia, unspecified: Secondary | ICD-10-CM | POA: Diagnosis not present

## 2020-01-18 DIAGNOSIS — I10 Essential (primary) hypertension: Secondary | ICD-10-CM | POA: Diagnosis not present

## 2020-01-18 LAB — LIPID PANEL
Chol/HDL Ratio: 4.3 ratio (ref 0.0–5.0)
Cholesterol, Total: 214 mg/dL — ABNORMAL HIGH (ref 100–199)
HDL: 50 mg/dL (ref >39)
LDL Chol Calc (NIH): 157 mg/dL — ABNORMAL HIGH (ref 0–99)
Triglycerides: 40 mg/dL (ref 0–149)
VLDL Cholesterol Cal: 7 mg/dL (ref 5–40)

## 2020-01-18 LAB — CBC
Hematocrit: 36.8 % — ABNORMAL LOW (ref 37.5–51.0)
Hemoglobin: 12.4 g/dL — ABNORMAL LOW (ref 13.0–17.7)
MCH: 32 pg (ref 26.6–33.0)
MCHC: 33.7 g/dL (ref 31.5–35.7)
MCV: 95 fL (ref 79–97)
Platelets: 123 10*3/uL — ABNORMAL LOW (ref 150–450)
RBC: 3.88 x10E6/uL — ABNORMAL LOW (ref 4.14–5.80)
RDW: 12.3 % (ref 11.6–15.4)
WBC: 5.9 10*3/uL (ref 3.4–10.8)

## 2020-01-18 LAB — COMPREHENSIVE METABOLIC PANEL
ALT: 16 IU/L (ref 0–44)
AST: 34 IU/L (ref 0–40)
Albumin/Globulin Ratio: 1.6 (ref 1.2–2.2)
Albumin: 4.1 g/dL (ref 3.6–4.6)
Alkaline Phosphatase: 84 IU/L (ref 48–121)
BUN/Creatinine Ratio: 24 (ref 10–24)
BUN: 27 mg/dL (ref 8–27)
Bilirubin Total: 0.5 mg/dL (ref 0.0–1.2)
CO2: 22 mmol/L (ref 20–29)
Calcium: 8.7 mg/dL (ref 8.6–10.2)
Chloride: 106 mmol/L (ref 96–106)
Creatinine, Ser: 1.14 mg/dL (ref 0.76–1.27)
GFR calc Af Amer: 67 mL/min/{1.73_m2} (ref >59)
GFR calc non Af Amer: 58 mL/min/{1.73_m2} — ABNORMAL LOW (ref >59)
Globulin, Total: 2.5 g/dL (ref 1.5–4.5)
Glucose: 84 mg/dL (ref 65–99)
Potassium: 4.4 mmol/L (ref 3.5–5.2)
Sodium: 140 mmol/L (ref 134–144)
Total Protein: 6.6 g/dL (ref 6.0–8.5)

## 2020-01-18 LAB — TSH: TSH: 5.79 u[IU]/mL — ABNORMAL HIGH (ref 0.450–4.500)

## 2020-01-25 ENCOUNTER — Telehealth: Payer: Self-pay

## 2020-01-25 NOTE — Telephone Encounter (Signed)
Called and spoke with pt, pt states he cannot handle the zetia and he does not want to do an injection. He reports he is doing well not and does not get out of breath while working. Will make Dr.Kelly aware.

## 2020-01-27 ENCOUNTER — Telehealth (HOSPITAL_COMMUNITY): Payer: Self-pay

## 2020-01-27 NOTE — Telephone Encounter (Signed)
ok 

## 2020-01-27 NOTE — Telephone Encounter (Signed)
Encounter complete. 

## 2020-01-31 ENCOUNTER — Telehealth (HOSPITAL_COMMUNITY): Payer: Self-pay | Admitting: *Deleted

## 2020-01-31 NOTE — Telephone Encounter (Signed)
Close encounter 

## 2020-02-01 ENCOUNTER — Ambulatory Visit (HOSPITAL_COMMUNITY)
Admission: RE | Admit: 2020-02-01 | Discharge: 2020-02-01 | Disposition: A | Discharge status: Home / Self Care | Payer: Medicare HMO | Source: Ambulatory Visit | Attending: Cardiovascular Disease | Admitting: Cardiovascular Disease

## 2020-02-01 ENCOUNTER — Other Ambulatory Visit: Payer: Self-pay

## 2020-02-01 DIAGNOSIS — E785 Hyperlipidemia, unspecified: Secondary | ICD-10-CM

## 2020-02-01 DIAGNOSIS — I2510 Atherosclerotic heart disease of native coronary artery without angina pectoris: Secondary | ICD-10-CM

## 2020-02-01 DIAGNOSIS — I25708 Atherosclerosis of coronary artery bypass graft(s), unspecified, with other forms of angina pectoris: Secondary | ICD-10-CM

## 2020-02-01 DIAGNOSIS — I10 Essential (primary) hypertension: Secondary | ICD-10-CM | POA: Diagnosis not present

## 2020-02-01 LAB — MYOCARDIAL PERFUSION IMAGING
Estimated workload: 7 METS
Exercise duration (min): 6 min
Exercise duration (sec): 0 s
LV dias vol: 125 mL (ref 62–150)
LV sys vol: 67 mL
MPHR: 135 {beats}/min
Peak HR: 160 {beats}/min
Percent HR: 118 %
Rest HR: 64 {beats}/min
SDS: 2
SRS: 7
SSS: 9
TID: 1.06

## 2020-02-01 MED ORDER — TECHNETIUM TC 99M TETROFOSMIN IV KIT
30.6000 | PACK | Freq: Once | INTRAVENOUS | Status: AC | PRN
  Administered 2020-02-01: 30.6 via INTRAVENOUS
  Filled 2020-02-01: qty 31

## 2020-02-01 MED ORDER — TECHNETIUM TC 99M TETROFOSMIN IV KIT
10.3000 | PACK | Freq: Once | INTRAVENOUS | Status: AC | PRN
  Administered 2020-02-01: 10.3 via INTRAVENOUS
  Filled 2020-02-01: qty 11

## 2020-02-06 DIAGNOSIS — H16011 Central corneal ulcer, right eye: Secondary | ICD-10-CM | POA: Diagnosis not present

## 2020-02-06 DIAGNOSIS — H5711 Ocular pain, right eye: Secondary | ICD-10-CM | POA: Diagnosis not present

## 2020-02-06 DIAGNOSIS — H02821 Cysts of right upper eyelid: Secondary | ICD-10-CM | POA: Diagnosis not present

## 2020-02-06 DIAGNOSIS — H25811 Combined forms of age-related cataract, right eye: Secondary | ICD-10-CM | POA: Diagnosis not present

## 2020-02-08 DIAGNOSIS — D23121 Other benign neoplasm of skin of left upper eyelid, including canthus: Secondary | ICD-10-CM | POA: Diagnosis not present

## 2020-02-08 DIAGNOSIS — H16011 Central corneal ulcer, right eye: Secondary | ICD-10-CM | POA: Diagnosis not present

## 2020-02-29 DIAGNOSIS — D23111 Other benign neoplasm of skin of right upper eyelid, including canthus: Secondary | ICD-10-CM | POA: Diagnosis not present

## 2020-03-14 DIAGNOSIS — Z23 Encounter for immunization: Secondary | ICD-10-CM | POA: Diagnosis not present

## 2020-03-14 DIAGNOSIS — M1990 Unspecified osteoarthritis, unspecified site: Secondary | ICD-10-CM | POA: Diagnosis not present

## 2020-03-14 DIAGNOSIS — Z0000 Encounter for general adult medical examination without abnormal findings: Secondary | ICD-10-CM | POA: Diagnosis not present

## 2020-03-14 DIAGNOSIS — I10 Essential (primary) hypertension: Secondary | ICD-10-CM | POA: Diagnosis not present

## 2020-03-14 DIAGNOSIS — I2510 Atherosclerotic heart disease of native coronary artery without angina pectoris: Secondary | ICD-10-CM | POA: Diagnosis not present

## 2020-03-14 DIAGNOSIS — H9190 Unspecified hearing loss, unspecified ear: Secondary | ICD-10-CM | POA: Diagnosis not present

## 2020-03-14 DIAGNOSIS — I350 Nonrheumatic aortic (valve) stenosis: Secondary | ICD-10-CM | POA: Diagnosis not present

## 2020-03-14 DIAGNOSIS — G720 Drug-induced myopathy: Secondary | ICD-10-CM | POA: Diagnosis not present

## 2020-03-14 DIAGNOSIS — I700 Atherosclerosis of aorta: Secondary | ICD-10-CM | POA: Diagnosis not present

## 2020-04-24 ENCOUNTER — Encounter: Payer: Self-pay | Admitting: Cardiovascular Disease

## 2020-04-24 ENCOUNTER — Ambulatory Visit: Payer: Medicare HMO | Admitting: Cardiovascular Disease

## 2020-04-24 ENCOUNTER — Other Ambulatory Visit: Payer: Self-pay

## 2020-04-24 VITALS — BP 140/66 | HR 68 | Ht 64.0 in | Wt 170.8 lb

## 2020-04-24 DIAGNOSIS — E785 Hyperlipidemia, unspecified: Secondary | ICD-10-CM | POA: Diagnosis not present

## 2020-04-24 DIAGNOSIS — Z789 Other specified health status: Secondary | ICD-10-CM | POA: Diagnosis not present

## 2020-04-24 DIAGNOSIS — I350 Nonrheumatic aortic (valve) stenosis: Secondary | ICD-10-CM | POA: Diagnosis not present

## 2020-04-24 DIAGNOSIS — Z951 Presence of aortocoronary bypass graft: Secondary | ICD-10-CM | POA: Diagnosis not present

## 2020-04-24 DIAGNOSIS — I10 Essential (primary) hypertension: Secondary | ICD-10-CM

## 2020-04-24 DIAGNOSIS — I2510 Atherosclerotic heart disease of native coronary artery without angina pectoris: Secondary | ICD-10-CM | POA: Diagnosis not present

## 2020-04-24 MED ORDER — NEXLETOL 180 MG PO TABS: 1.0000 | ORAL_TABLET | Freq: Every day | ORAL | 1 refills | Status: DC

## 2020-04-24 NOTE — Patient Instructions (Signed)
Medication Instructions:  Dr. Claiborne Billings recommends that you start Nexletol 180mg  daily  *If you need a refill on your cardiac medications before your next appointment, please call your pharmacy*   Lab Work: FASTING lipid panel in 6 weeks  If you have labs (blood work) drawn today and your tests are completely normal, you will receive your results only by: Marland Kitchen MyChart Message (if you have MyChart) OR . A paper copy in the mail If you have any lab test that is abnormal or we need to change your treatment, we will call you to review the results.   Follow-Up: At [facility], you and your health needs are our priority.  As part of our continuing mission to provide you with exceptional heart care, we have created designated Provider Care Teams.  These Care Teams include your primary Cardiologist (physician) and Advanced Practice Providers (APPs -  Physician Assistants and Nurse Practitioners) who all work together to provide you with the care you need, when you need it.  We recommend signing up for the patient portal called "MyChart".  Sign up information is provided on this After Visit Summary.  MyChart is used to connect with patients for Virtual Visits (Telemedicine).  Patients are able to view lab/test results, encounter notes, upcoming appointments, etc.  Non-urgent messages can be sent to your provider as well.   To learn more about what you can do with MyChart, go to [email or web address].    Your next appointment:   6 month(s)  The format for your next appointment:   In Person  Provider:   You will see one of the following Dr. Claiborne Billings Advanced Practice Providers on your designated Care Team:    [place], PA-C  Fabian Sharp, PA-C or   Roby Lofts, [place]

## 2020-04-24 NOTE — Progress Notes (Signed)
Patient ID: Jared Dunlap, male   DOB: 1932-01-28, 86 y.o.   MRN: [id number]    Primary M.D.: Dr.Ehinger  HPI: Jared Dunlap is a 86 y.o. male who presents to the office for a 4 month cardiology evaluation.  Jared Dunlap has known CAD and in August 1998 underwent CABG surgery x6 with LIMA to the LAD, saphenous vein graft to diagonal, sequential vein graft to the OM1-OM2, and vein graft to the right coronary artery.  In 2001 a Cardiolite study which was done for routine followup evaluation when the patient was asymptomatic demonstrated new circumflex ischemia. Catheterization revealed total occlusion of the graft to the marginal 1 vessel but he did have a patent sequential limb from OM1-OM 2. There was a 95% native circumflex stenosis  .  At that time, I successfully performed PCI/stenting of the native circumflex vessel with a 3.5x15 mm bare metal  NIR stent. Subsequent nuclear perfusion studies has continued to show normal fairly normal perfusion. His last nuclear perfusion study was done in May 2014 which wasshowed mild apical thinning and basal lateral defect without associated ischemia.  Jared Dunlap is remaining active. He plays golf at least 3 days per week. He exercises at the gym at least 3 days per week both doing aerobics as well as weights. He remains active doing yard work as well.   He has a history of hyperlipidemia and hypertension.  He has been maintained on benazepril 40 mg, Toprol 25 mg for blood pressure control.  He has tolerated atorvastatin 20 mg in addition to fish oil for his hyperlipidemia.  He has had difficulty tolerating Crestor, Zetia, simvastatin, and WelChol, and has been only able to take low-dose atorvastatin with coenzyme Q10 for tolerability.  He sees Dr. Rosana Hoes for primary care.  Laboratory in 2015 demonstrated elevated lipids with a total cholesterol 215 LDL cholesterol 156, triglyceride level at 112, HDL 37, and non-HDL cholesterol 178.  BUN was 28, creatinine  1.0.  Jared Dunlap has continued to be active.  He plays golf at least 2-3 times per week, he exercises regularly.  In July 2016 a follow-up nuclear perfusion study showed an ejection fraction at 47% and there was mild basal inferoseptal hypokinesis.  There was no evidence for any ischemia.  In July 2016.  He developed bilateral nonobstructive nephro lithiasis.  Kidney stones were removed by Dr. Tresa Moore.  He  changed primary care providers and saw Dr. Gaynelle Arabian.  Previously had seen Dr. Rosana Hoes  His last laboratory in March 2018 showed a total cholesterol 207, triglycerides 58, HDL 35, LDL 160, and non-HDL cholesterol 172.  April lipoprotein B was elevated at 103.  Jared Dunlap has been intolerant to all statins including Zocor, Pravachol, Crestor, atorvastatin, and previously also used Baycol and WelChol.  When he was recently seen by Dr.Ehinger and was recommended that he see me back to further discuss PCSK9 inhibitor therapy and possible initiation of treatment.  When I saw him in September 2018 I had a long discussion with him regarding the effects of PCSK9 inhibition therapy.  I reviewed with him at length both regression trial data as well as outcome data.  Over the past 6 months, has remained asymptomatic.  He plays golf at least 3 days/week.  Today he played golf and shot his age 86.  He denies chest pain.  He continues to do yard work.  He had follow-up lab work in February 2019 by Dr. [name];  total cholesterol was 182, HDL 41, LDL  132, triglycerides 43.  I  saw him in April 2019 and had not seen him over 2 years since his last evaluation in June 2021.  Over the past several years he continued to be asymptomatic without recurrent chest pain symptomatology. At 86 years old, he was still playing golf 3 days/week.  He has not had recent laboratory.  He has not seen his primary physician in some time.  He denied significant shortness of breath.  He still does weed eating on his yard.  He states his  blood pressure at home consistently is in the 993T with diastolics in the 70V.  Both he and his wife take their blood pressures on a daily basis. At that time, he had a cardiac murmur in the aortic position and I recommended he undergo a 2D echo Doppler study for further assessment of systolic and diastolic function as well as valvular architecture and specifically aortic stenosis. He also recommended a 5-year nuclear perfusion study to make certain there was no major change from his 2016 evaluation. He was no longer taking Zetia and was not on any statin therapy due to intolerance. I also recommended fasting laboratory.  An echo Doppler study was done on December 28, 2019 which showed an EF of 50 to 55%. There was mild basal septal ventricular hypertrophy. There was grade 2 diastolic dysfunction. He had hypokinesis of his inferolateral wall. PA pressure was normal. A nuclear perfusion study showed an EF at 46% with a fixed anterior perfusion defect with associated  mild hypokinesis.  Presently, Jared Dunlap remains completely asymptomatic. He is now 86 years old and still playing golf 3 days/week. He does the weedeater on 9 yards without difficulty and denies exertional shortness of breath. I reviewed his laboratory from January 18, 2020. Renal function was normal. Hemoglobin 12.4 hematocrit 36.8. Platelets 123. TSH 5.7. Lipid studies had increased and total cholesterol was 214 with an LDL cholesterol 157. He presents for evaluation.  Past Medical History:  Diagnosis Date  . CAD (coronary artery disease) of artery bypass graft 04/18/2013   CABG x 6 1998; PCI to native LCx 2001   . CAD in native artery 02/05/2016  . Coronary artery disease    followed by Dr. Claiborne Billings - last office visit 06/08/13  . Essential hypertension 04/18/2013  . History of kidney stones   . Hyperlipidemia with target LDL less than 70 04/18/2013  . Hypertension   . MI (myocardial infarction) ([place]) 1998    x2 MI's    Past Surgical  History:  Procedure Laterality Date  . BYPASS GRAFT  1998  . CORONARY ARTERY BYPASS GRAFT  1998   6 vessels  . CYSTOSCOPY WITH RETROGRADE PYELOGRAM, URETEROSCOPY AND STENT PLACEMENT Right 05/11/2013   Procedure: CYSTOSCOPY WITH RIGHT RETROGRADE PYELOGRAM,CYSTOLITHOPAXY/DIAGNOSTIC  DIGITAL FLEXIBLE RIGHT URETEROSCOPY WITH STENT PLACEMENT;  Surgeon: Alexis Frock, MD;  Location: [facility];  Service: Urology;  Laterality: Right;  . CYSTOSCOPY WITH RETROGRADE PYELOGRAM, URETEROSCOPY AND STENT PLACEMENT Right 06/10/2013   Procedure: CYSTOSCOPY WITH RETROGRADE PYELOGRAM, URETEROSCOPY AND STENT PLACEMENT;  Surgeon: Alexis Frock, MD;  Location: [facility];  Service: Urology;  Laterality: Right;  . INNER EAR SURGERY    . JOINT REPLACEMENT     Left hip  . KIDNEY STONE SURGERY  1966  . stented coronary artery  1999  . TONSILLECTOMY      Allergies  Allergen Reactions  . Crestor [Rosuvastatin] Other (See Comments)    UNSPECIFIED  . Welchol [Colesevelam Hcl] Other (See Comments)  UNSPECIFIED  . Zetia [Ezetimibe] Other (See Comments)    UNSPECIFIED  . Zocor [Simvastatin] Other (See Comments)    unspecified    Current Outpatient Medications  Medication Sig Dispense Refill  . benazepril (LOTENSIN) 40 MG tablet TAKE 1 TABLET EVERY DAY 90 tablet 3  . Coenzyme Q10 (COQ10) 100 MG CAPS Take 1 tablet by mouth daily.     . Flaxseed, Linseed, (FLAX SEEDS PO) Take 1 tablet by mouth daily.     . folic acid (FOLVITE) 825 MCG tablet Take 1 tablet (400 mcg total) by mouth daily. 90 tablet 3  . Glucosamine-Chondroitin (OSTEO BI-FLEX REGULAR STRENGTH PO) Take 1 tablet by mouth daily.    . Lecithin 1200 MG CAPS Take 1,200 mg by mouth daily.    . metoprolol tartrate (LOPRESSOR) 25 MG tablet TAKE 1/2 TABLET TWICE DAILY 90 tablet 3  . primidone (MYSOLINE) 50 MG tablet 50 mg.    . pyridOXINE (B-6) 50 MG tablet Take 50 mg by mouth daily.    Marland Kitchen terazosin (HYTRIN) 5 MG capsule Take 5 mg by mouth at bedtime.    .  vitamin B-12 (CYANOCOBALAMIN) 500 MCG tablet Take 500 mcg by mouth daily.    . vitamin C (ASCORBIC ACID) 500 MG tablet Take 500 mg by mouth daily.    . Bempedoic Acid (NEXLETOL) 180 MG TABS Take 1 tablet by mouth daily. 30 tablet 1   No current facility-administered medications for this visit.    Social History   Socioeconomic History  . Marital status: Married    Spouse name: Not on file  . Number of children: Not on file  . Years of education: Not on file  . Highest education level: Not on file  Occupational History  . Not on file  Tobacco Use  . Smoking status: Former Smoker    Types: Cigarettes    Quit date: 04/19/1963    Years since quitting: 57.0  . Smokeless tobacco: Never Used  Substance and Sexual Activity  . Alcohol use: No  . Drug use: No  . Sexual activity: Never  Other Topics Concern  . Not on file  Social History Narrative  . Not on file   Social Determinants of Health   Financial Resource Strain:   . Difficulty of Paying Living Expenses: Not on file  Food Insecurity:   . Worried About Charity fundraiser in the Last Year: Not on file  . Ran Out of Food in the Last Year: Not on file  Transportation Needs:   . Lack of Transportation (Medical): Not on file  . Lack of Transportation (Non-Medical): Not on file  Physical Activity:   . Days of Exercise per Week: Not on file  . Minutes of Exercise per Session: Not on file  Stress:   . Feeling of Stress : Not on file  Social Connections:   . Frequency of Communication with Friends and Family: Not on file  . Frequency of Social Gatherings with Friends and Family: Not on file  . Attends Religious Services: Not on file  . Active Member of Clubs or Organizations: Not on file  . Attends Archivist Meetings: Not on file  . Marital Status: Not on file  Intimate Partner Violence:   . Fear of Current or Ex-Partner: Not on file  . Emotionally Abused: Not on file  . Physically Abused: Not on file  .  Sexually Abused: Not on file    Family History  Problem Relation Age of Onset  .  Heart disease Mother   . Heart disease Father   . Heart disease Brother   . Cancer Brother   . Heart disease Brother   . Kidney Stones Brother    Socially he is married for 61 years. 3 children 4 grandchildren 3 great-grandchildren. There is no alcohol use. He quit tobacco in 1965.  ROS General: Negative; No fevers, chills, or night sweats;  HEENT: Negative; No changes in vision or hearing, sinus congestion, difficulty swallowing Pulmonary: Negative; No cough, wheezing, shortness of breath, hemoptysis Cardiovascular: See history of present illness; No chest pain, presyncope, syncope, palpitations GI: Negative; No nausea, vomiting, diarrhea, or abdominal pain GU: Negative; No dysuria, hematuria, or difficulty voiding Musculoskeletal: Negative; no myalgias, joint pain, or weakness Hematologic/Oncology: Negative; no easy bruising, bleeding Endocrine: Negative; no heat/cold intolerance; no diabetes Neuro: Negative; no changes in balance, headaches Skin: Negative; No rashes or skin lesions Psychiatric: Negative; No behavioral problems, depression Sleep: Negative; No snoring, daytime sleepiness, hypersomnolence, bruxism, restless legs, hypnogognic hallucinations, no cataplexy Other comprehensive 14 point system review is negative.  PE BP 140/66   Pulse 68   Ht '5\' 4"'  (1.626 m)   Wt 170 lb 12.8 oz (77.5 kg)   SpO2 95%   BMI 29.32 kg/m    Repeat blood pressure by me was 120/70  Wt Readings from Last 3 Encounters:  04/24/20 170 lb 12.8 oz (77.5 kg)  02/01/20 169 lb (76.7 kg)  12/26/19 169 lb 12.8 oz (77 kg)   General: Alert, oriented, no distress.  Skin: normal turgor, no rashes, warm and dry HEENT: Normocephalic, atraumatic. Pupils equal round and reactive to light; sclera anicteric; extraocular muscles intact;  Nose without nasal septal hypertrophy Mouth/Parynx benign; Mallinpatti scale 3 Neck:  No JVD, no carotid bruits; normal carotid upstroke Lungs: clear to ausculatation and percussion; no wheezing or rales Chest wall: without tenderness to palpitation Heart: PMI not displaced, RRR, s1 s2 normal, 1/6 systolic murmur, no diastolic murmur, no rubs, gallops, thrills, or heaves Abdomen: soft, nontender; no hepatosplenomehaly, BS+; abdominal aorta nontender and not dilated by palpation. Back: no CVA tenderness Pulses 2+ Musculoskeletal: full range of motion, normal strength, no joint deformities Extremities: no clubbing cyanosis or edema, Homan's sign negative  Neurologic: grossly nonfocal; Cranial nerves grossly wnl Psychologic: Normal mood and affect   ECG (independently read by me): Sinus rhythm at 68; PVC, old inferior Q waves in 3, aVF.  December 26, 2019 ECG (independently read by me): Normal sinus rhythm at 65 bpm.  1 isolated PVC.  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  April 2019 ECG (independently read by me): Normal sinus rhythm at 73 bpm.  PVC.  Old inferior infarct.  September 2018 ECG (independently read by me): sinus bradycardia 58 bpm.  Inferior Q waves in 3 and aVF, ST abnormality in 1 and aVL.  August 2017 ECG (independently read by me): Normal sinus rhythm at 64 bpm.  Inferior Q waves in 3 and aVF.  October 2015 ECG (independently read by me): Sinus bradycardia 55 beats per minute.  Nonspecific ST changes.  Prior October 2014 ECG: Normal sinus rhythm. QTc interval 399 ms.  LABS: BMP Latest Ref Rng & Units 01/18/2020 01/17/2015 01/04/2015  Glucose 65 - 99 mg/dL 84 107(H) 95  BUN 8 - 27 mg/dL 27 17 29(H)  Creatinine 0.76 - 1.27 mg/dL 1.14 1.16 1.07  BUN/Creat Ratio 10 - 24 24 - -  Sodium 134 - 144 mmol/L 140 139 143  Potassium 3.5 -  5.2 mmol/L 4.4 4.1 4.5  Chloride 96 - 106 mmol/L 106 106 108  CO2 20 - 29 mmol/L '22 26 27  ' Calcium 8.6 - 10.2 mg/dL 8.7 9.2 8.5   Hepatic Function Latest Ref Rng & Units 01/18/2020 02/07/2015 01/17/2015  Total Protein 6.0 -  8.5 g/dL 6.6 6.3 6.1(L)  Albumin 3.6 - 4.6 g/dL 4.1 3.7 3.5  AST 0 - 40 IU/L 34 37(H) 30  ALT 0 - 44 IU/L '16 29 22  ' Alk Phosphatase 48 - 121 IU/L 84 63 63  Total Bilirubin 0.0 - 1.2 mg/dL 0.5 0.4 0.6  Bilirubin, Direct <=0.2 mg/dL - 0.1 -   CBC Latest Ref Rng & Units 01/18/2020 01/17/2015 01/04/2015  WBC 3.4 - 10.8 x10E3/uL 5.9 6.5 6.5  Hemoglobin 13.0 - 17.7 g/dL 12.4(L) 13.4 13.3  Hematocrit 37.5 - 51.0 % 36.8(L) 39.2 41.2  Platelets 150 - 450 x10E3/uL 123(L) 151 141(L)   Lab Results  Component Value Date   MCV 95 01/18/2020   MCV 92.5 01/17/2015   MCV 94.9 01/04/2015   Lab Results  Component Value Date   TSH 5.790 (H) 01/18/2020   Lipid Panel     Component Value Date/Time   CHOL 214 (H) 01/18/2020 0840   TRIG 40 01/18/2020 0840   HDL 50 01/18/2020 0840   CHOLHDL 4.3 01/18/2020 0840   LDLCALC 157 (H) 01/18/2020 0840     RADIOLOGY: No results found.  IMPRESSION:  1. CAD in native artery   2. Hx of CABG   3. Essential hypertension   4. Hyperlipidemia with target LDL less than 70   5. Statin intolerance   6. Mild -moderate aortic stenosis     ASSESSMENT AND PLAN: Jared Dunlap is an 86 year old Caucasian male who underwent CABG surgery x6 in August 1998. In 2001 he was entirely asymptomatic but a nuclear perfusion study demonstrated a large area of ischemia laterally and repeat cardiac catheterization revealed an occluded graft that supplied the circumflex marginal vessel. I performed  successful percutaneous coronary intervention to his native LCx.  Subsequently, Jared Dunlap has continued to do well. Presently, he has done remarkably well. He has been on a medical regimen for hypertension which now includes benazepril 40 mg, metoprolol tartrate 12.5 mg twice a day in addition to Terazosin 5 mg. His blood pressure has remained remarkably stable. In the past he had history of significant hyperlipidemia and did not tolerate multiple doses of multiple statins. I had also  had lengthy discussion with him in the past concerning PCSK9 inhibition with Repatha or Praluent. He also was given a trial of Zetia. He states he did not tolerate Zetia and had the same symptoms. His laboratory obtained this summer now shows significant LDL elevation at 157. He continues to be against PCSK9 inhibition statin or Zetia use. I have provided him with samples of bempedoic acid 180 mg to see if he can tolerate this and will try to obtain follow-up chemistry and lipid studies in 6 weeks on therapy. I reviewed his echo Doppler study which demonstrates mild to moderate aortic valve stenosis which is consistent with his murmur. Mean gradient was 14 with a peak gradient of 24.3 and estimated AVA at 1.23 cm. His nuclear perfusion study suggests a region of prior scar without associated ischemia. He will continue current therapy as prescribed. Hopefully he will tolerate bempedoic acid and hopefully he can have some financial assistance for this treatment if it proves effective. I will see him in 6  months for follow-up evaluation   Jared Sine, MD, [facility]  04/25/2020 9:31 AM

## 2020-04-25 ENCOUNTER — Encounter: Payer: Self-pay | Admitting: Cardiovascular Disease

## 2020-05-15 DIAGNOSIS — H903 Sensorineural hearing loss, bilateral: Secondary | ICD-10-CM | POA: Diagnosis not present

## 2020-05-31 DIAGNOSIS — H903 Sensorineural hearing loss, bilateral: Secondary | ICD-10-CM | POA: Diagnosis not present

## 2020-06-18 DIAGNOSIS — H25813 Combined forms of age-related cataract, bilateral: Secondary | ICD-10-CM | POA: Diagnosis not present

## 2020-06-18 DIAGNOSIS — H04123 Dry eye syndrome of bilateral lacrimal glands: Secondary | ICD-10-CM | POA: Diagnosis not present

## 2020-06-18 DIAGNOSIS — H5211 Myopia, right eye: Secondary | ICD-10-CM | POA: Diagnosis not present

## 2020-06-18 DIAGNOSIS — H353131 Nonexudative age-related macular degeneration, bilateral, early dry stage: Secondary | ICD-10-CM | POA: Diagnosis not present

## 2020-12-17 DIAGNOSIS — M25511 Pain in right shoulder: Secondary | ICD-10-CM | POA: Diagnosis not present

## 2020-12-17 DIAGNOSIS — M19011 Primary osteoarthritis, right shoulder: Secondary | ICD-10-CM | POA: Diagnosis not present

## 2020-12-25 DIAGNOSIS — M25511 Pain in right shoulder: Secondary | ICD-10-CM | POA: Diagnosis not present

## 2021-01-03 DIAGNOSIS — M25511 Pain in right shoulder: Secondary | ICD-10-CM | POA: Diagnosis not present

## 2021-01-09 DIAGNOSIS — M25511 Pain in right shoulder: Secondary | ICD-10-CM | POA: Diagnosis not present

## 2021-01-11 DIAGNOSIS — M25511 Pain in right shoulder: Secondary | ICD-10-CM | POA: Diagnosis not present

## 2021-01-15 DIAGNOSIS — M25511 Pain in right shoulder: Secondary | ICD-10-CM | POA: Diagnosis not present

## 2021-01-17 DIAGNOSIS — M25511 Pain in right shoulder: Secondary | ICD-10-CM | POA: Diagnosis not present

## 2021-01-22 DIAGNOSIS — M25511 Pain in right shoulder: Secondary | ICD-10-CM | POA: Diagnosis not present

## 2021-01-24 DIAGNOSIS — U071 COVID-19: Secondary | ICD-10-CM | POA: Diagnosis not present

## 2021-01-24 DIAGNOSIS — R197 Diarrhea, unspecified: Secondary | ICD-10-CM | POA: Diagnosis not present

## 2021-01-24 DIAGNOSIS — R519 Headache, unspecified: Secondary | ICD-10-CM | POA: Diagnosis not present

## 2021-01-24 DIAGNOSIS — R059 Cough, unspecified: Secondary | ICD-10-CM | POA: Diagnosis not present

## 2021-01-24 DIAGNOSIS — Z79899 Other long term (current) drug therapy: Secondary | ICD-10-CM | POA: Diagnosis not present

## 2021-03-20 DIAGNOSIS — I700 Atherosclerosis of aorta: Secondary | ICD-10-CM | POA: Diagnosis not present

## 2021-03-20 DIAGNOSIS — D696 Thrombocytopenia, unspecified: Secondary | ICD-10-CM | POA: Diagnosis not present

## 2021-03-20 DIAGNOSIS — Z1389 Encounter for screening for other disorder: Secondary | ICD-10-CM | POA: Diagnosis not present

## 2021-03-20 DIAGNOSIS — G720 Drug-induced myopathy: Secondary | ICD-10-CM | POA: Diagnosis not present

## 2021-03-20 DIAGNOSIS — Z23 Encounter for immunization: Secondary | ICD-10-CM | POA: Diagnosis not present

## 2021-03-20 DIAGNOSIS — Z0000 Encounter for general adult medical examination without abnormal findings: Secondary | ICD-10-CM | POA: Diagnosis not present

## 2021-03-20 DIAGNOSIS — I2510 Atherosclerotic heart disease of native coronary artery without angina pectoris: Secondary | ICD-10-CM | POA: Diagnosis not present

## 2021-03-20 DIAGNOSIS — R946 Abnormal results of thyroid function studies: Secondary | ICD-10-CM | POA: Diagnosis not present

## 2021-03-20 DIAGNOSIS — I10 Essential (primary) hypertension: Secondary | ICD-10-CM | POA: Diagnosis not present

## 2021-03-25 DIAGNOSIS — H52203 Unspecified astigmatism, bilateral: Secondary | ICD-10-CM | POA: Diagnosis not present

## 2021-03-25 DIAGNOSIS — H524 Presbyopia: Secondary | ICD-10-CM | POA: Diagnosis not present

## 2021-03-25 DIAGNOSIS — H2513 Age-related nuclear cataract, bilateral: Secondary | ICD-10-CM | POA: Diagnosis not present

## 2021-03-27 DIAGNOSIS — R7301 Impaired fasting glucose: Secondary | ICD-10-CM | POA: Diagnosis not present

## 2021-11-27 DIAGNOSIS — R3911 Hesitancy of micturition: Secondary | ICD-10-CM | POA: Diagnosis not present

## 2022-03-26 DIAGNOSIS — H2513 Age-related nuclear cataract, bilateral: Secondary | ICD-10-CM | POA: Diagnosis not present

## 2022-03-26 DIAGNOSIS — H353131 Nonexudative age-related macular degeneration, bilateral, early dry stage: Secondary | ICD-10-CM | POA: Diagnosis not present

## 2022-04-14 DIAGNOSIS — Z23 Encounter for immunization: Secondary | ICD-10-CM | POA: Diagnosis not present

## 2022-05-05 DIAGNOSIS — G720 Drug-induced myopathy: Secondary | ICD-10-CM | POA: Diagnosis not present

## 2022-05-05 DIAGNOSIS — D696 Thrombocytopenia, unspecified: Secondary | ICD-10-CM | POA: Diagnosis not present

## 2022-05-05 DIAGNOSIS — N183 Chronic kidney disease, stage 3 unspecified: Secondary | ICD-10-CM | POA: Diagnosis not present

## 2022-05-05 DIAGNOSIS — I700 Atherosclerosis of aorta: Secondary | ICD-10-CM | POA: Diagnosis not present

## 2022-05-05 DIAGNOSIS — I2510 Atherosclerotic heart disease of native coronary artery without angina pectoris: Secondary | ICD-10-CM | POA: Diagnosis not present

## 2022-05-05 DIAGNOSIS — Z0000 Encounter for general adult medical examination without abnormal findings: Secondary | ICD-10-CM | POA: Diagnosis not present

## 2022-05-05 DIAGNOSIS — I10 Essential (primary) hypertension: Secondary | ICD-10-CM | POA: Diagnosis not present

## 2022-05-05 DIAGNOSIS — Z1331 Encounter for screening for depression: Secondary | ICD-10-CM | POA: Diagnosis not present

## 2022-05-05 DIAGNOSIS — I350 Nonrheumatic aortic (valve) stenosis: Secondary | ICD-10-CM | POA: Diagnosis not present

## 2022-08-19 DIAGNOSIS — E038 Other specified hypothyroidism: Secondary | ICD-10-CM | POA: Diagnosis not present

## 2022-10-16 ENCOUNTER — Telehealth: Payer: Self-pay | Admitting: *Deleted

## 2022-10-16 NOTE — Telephone Encounter (Signed)
  Croitoru, Mihai, MD  Physician Cardiology   Telephone Encounter Signed   Creation Time: 10/16/2022  1:55 PM   Signed     As a general rule "subclinical hypothyroidism" (elevated TSH without any symptoms of hypothyroidism such as cold intolerance, weight gain, sluggish mental abilities, fatigue, slow heartbeat, change in skin or hair texture, etc.) does not require thyroid hormone supplementation.  If the symptoms were to develop, levothyroxine is a very safe medication that is chemically identical with the thyroid hormone that we all produce.  He was worried about the potential for heart rhythm problems if he were to take this medication.  These would only occur if he were receiving excessive amounts of thyroid hormone supplement. Dr. Manus Gunning may have found some abnormalities during his examination that made him concerned that the hypothyroidism was clinically apparent.  If not and if he truly has no signs or symptoms of hypothyroidism, I would agree with Pam's endocrinologist.          Called and spoke with [facility], read her Dr Erin Hearing message. I also sent over MyChart. She verbalized understanding. She said that her dad does not have any symptoms, so he won't take the levothyroxine. She was thankful for the prompt response and recommendations.

## 2022-10-16 NOTE — Telephone Encounter (Signed)
Placed  note from daughter pam allred in   a telephone message for  Dr Royann Shivers and Dr Tresa Endo to review   Hi Dr. Salena Saner.  Thanks for taking such great care of my Mom!    My father said he spoke to you about his TSH. I tried to message you under his account but it wouldn't allow.  Georgann Housekeeper Jarnigan. DOB: 12/17/1931   Dr. Manus Gunning [facility]) wants him to take levothyrozxine .  His TSH numbers over the past 3 years are: 02/2021.     4.99 04/2022.   5.47 07/2022.     6.06   He doesn't want to take this med and my endocrinologist (just in conversation with me) said he shouldn't take and suggested he get your and Dr. Landry Dyke recommendation.    Thank you for your help.    Pam Allred [phone]

## 2022-10-16 NOTE — Telephone Encounter (Signed)
As a general rule "subclinical hypothyroidism" (elevated TSH without any symptoms of hypothyroidism such as cold intolerance, weight gain, sluggish mental abilities, fatigue, slow heartbeat, change in skin or hair texture, etc.) does not require thyroid hormone supplementation.  If the symptoms were to develop, levothyroxine is a very safe medication that is chemically identical with the thyroid hormone that we all produce.  He was worried about the potential for heart rhythm problems if he were to take this medication.  These would only occur if he were receiving excessive amounts of thyroid hormone supplement. Dr. Manus Gunning may have found some abnormalities during his examination that made him concerned that the hypothyroidism was clinically apparent.  If not and if he truly has no signs or symptoms of hypothyroidism, I would agree with Pam's endocrinologist.

## 2022-12-03 DIAGNOSIS — E039 Hypothyroidism, unspecified: Secondary | ICD-10-CM | POA: Diagnosis not present

## 2022-12-15 ENCOUNTER — Other Ambulatory Visit (HOSPITAL_COMMUNITY): Payer: Self-pay

## 2023-01-09 DIAGNOSIS — R42 Dizziness and giddiness: Secondary | ICD-10-CM | POA: Diagnosis not present

## 2023-01-09 DIAGNOSIS — I350 Nonrheumatic aortic (valve) stenosis: Secondary | ICD-10-CM | POA: Diagnosis not present

## 2023-01-12 ENCOUNTER — Telehealth: Payer: Self-pay | Admitting: Cardiovascular Disease

## 2023-01-12 NOTE — Telephone Encounter (Signed)
STAT if patient feels like he/she is going to faint   Are you dizzy now? Depends on movement  Do you feel faint or have you passed out? No  Do you have any other symptoms? No  Have you checked your HR and BP (record if available)? 120/60

## 2023-01-12 NOTE — Telephone Encounter (Signed)
Patient daughter state  has dizziness issues for a week or so.  Went to PCP and seen for vertigo.PCP states not an issue.  Echo and carotid ordered per Daughter "to see if blood getting to brain".  States when dizzy when gets up or bends over.  States other times OK with maybe occasional episode.  Asked if did Orthostatics and she is not sure as she did not go into appt until the end.   Call to Dr Lewie Chamber at [facility] (PCP).  They will fax his OV notes and his Labs if resulted.    Patient not seen since 2021. So advised he would need to be seen. She states he is compliant approx 10 times after noted he has not been seen in 3 years and that no one called him to set appt. He was to F/U in 6 months.  Appt set for next week.  Advised that PCP is following all areas.  If he worsens she should call PCP or go to ED.

## 2023-01-19 NOTE — Progress Notes (Unsigned)
Cardiology Clinic Note   Date: 01/20/2023 ID: Sylas, Twombly 1931-11-03, MRN [id number]  Primary Cardiologist:  Nicki Guadalajara, MD  Patient Profile    Jared Dunlap is a 89 y.o. male who presents to the clinic today for follow up.     Past medical history significant for: CAD. CABG x 02 February 1997: LIMA to LAD, SVG to diagonal, sequential vein graft to OM1 and OM2, SVG to RCA. LHC 03/26/2000: Total occlusion of the graft to OM1 with patent sequential limb from OM1 to OM 2.  Native LCx 95%.  PCI with BMS to native LCx. Nuclear stress test 02/01/2020: Mild hypokinesis of anterior wall.  Medium sized, fixed anterior wall perfusion defect consistent with infarct.  No ischemia noted on perfusion images or stress ECG. Aortic valve stenosis. Echo 12/28/2019: EF 50 to 55%.  Mild LVH of basal-septal segment.  Grade II DD.  Moderate hypokinesis of the left ventricular, entire inferior lateral wall.  Normal RV function.  Moderate LAE.  Trivial MR.  Mild to moderate aortic valve stenosis, mean gradient 14 mmHg. Hypertension. Hyperlipidemia.     History of Present Illness    Jared Dunlap is a longtime patient of cardiology.  He is followed by Dr. Tresa Endo for the above outlined history.  Patient was last seen in the office by Dr. Tresa Endo on 04/24/2020 for routine follow-up.  He was doing well at that time and no medication changes were made.  Patient's daughter recently contacted the office on 01/12/2023 with reports of dizziness for which PCP stated was not vertigo.  Per triage notes "Patient daughter state  has dizziness issues for a week or so.  Went to PCP and seen for vertigo.PCP states not an issue.  Echo and carotid ordered per Daughter "to see if blood getting to brain".  States when dizzy when gets up or bends over.  States other times OK with maybe occasional episode.  Asked if did Orthostatics and she is not sure as she did not go into appt until the end.   Call to Dr Lewie Chamber at [place]  (PCP).  They will fax his OV notes and his Labs if resulted.    Patient not seen since 2021. So advised he would need to be seen. She states he is compliant approx 10 times after noted he has not been seen in 3 years and that no one called him to set appt. He was to F/U in 6 months.  Appt set for next week.  Advised that PCP is following all areas.  If he worsens she should call PCP or go to ED."  Today, patient was experiencing dizziness as described above. His PCP felt it was not vertigo as the maneuvers done in the office did not improve his symptoms. However, he performed Epley Maneuvers at home and his symptoms resolved and have not returned. He is scheduled for an echo in August and I instructed him to keep that appointment, as it would be a good idea to check on his aortic valve to ensure his stenosis has not progressed. He is doing well otherwise. Patient denies shortness of breath or dyspnea on exertion. No chest pain, pressure, or tightness. Denies lower extremity edema, orthopnea, or PND. No palpitations. He stays very active doing yard work at home and for 9 lots he owns. His wife states "he has ants in his pants and can't sit still." Patient is hypertensive today with intake BP 170/72 and 148/72 on my recheck. Patient  states it is always high in the doctor's office. He checks BP at home daily and it SBP is typically 130-140.    ROS: All other systems reviewed and are otherwise negative except as noted in History of Present Illness.  Studies Reviewed    EKG Interpretation Date/Time:  Tuesday January 20 2023 14:01:19 EDT Ventricular Rate:  67 PR Interval:  202 QRS Duration:  106 QT Interval:  384 QTC Calculation: 405 R Axis:   -16  Text Interpretation: Normal sinus rhythm Left ventricular hypertrophy with repolarization abnormality ( Cornell product ) When compared with ECG of 17-Jan-2015 10:13, PREVIOUS ECG IS PRESENT Confirmed by Carlos Levering [phone]) on 01/20/2023 2:11:00 PM    Risk Assessment/Calculations      HYPERTENSION CONTROL Vitals:   01/20/23 1402 01/20/23 1641  BP: (!) 170/72 (!) 148/72    The patient's blood pressure is elevated above target today.  In order to address the patient's elevated BP: The blood pressure is usually elevated in clinic.  Blood pressures monitored at home have been optimal.           Physical Exam    VS:  BP (!) 170/72   Pulse 67   Ht 5\' 4"  (1.626 m)   Wt 164 lb (74.4 kg)   SpO2 98%   BMI 28.15 kg/m  , BMI Body mass index is 28.15 kg/m.  GEN: Well nourished, well developed, in no acute distress. Neck: No JVD or carotid bruits. Cardiac:  RRR. No murmurs. No rubs or gallops.   Respiratory:  Respirations regular and unlabored. Clear to auscultation without rales, wheezing or rhonchi. GI: Soft, nontender, nondistended. Extremities: Radials/DP/PT 2+ and equal bilaterally. No clubbing or cyanosis. No edema.  Skin: Warm and dry, no rash. Neuro: Strength intact.  Assessment & Plan    Dizziness.  Patient reports he did Epley Maneuvers at home and dizziness resolved without any further episodes. He is scheduled for an echo in August ordered by PCP. I instructed him to keep this appointment as aortic valve should be can be reevaluated.  CAD.  S/p CABG x 02 February 1997, PCI with BMS to native LCx September 2001. Patient denies chest pain, pressure, tightness. Continue metoprolol, bempedoic acid. Aortic valve stenosis.  Echo June 2021 showed low normal LV function, normal RV function, Grade II DD, mild to moderate aortic stenosis mean gradient 14 mmHg.  Patient denies lightheadedness, dizziness, presyncope, syncope, shortness of breath, DOE or lower extremity edema. He is very active performing yard work at home and on 9 lots he owns. He is scheduled for an echo in August as ordered by PCP.  Hypertension: BP today 170/72 on intake and 148/72 on my recheck. Home SBP typically 130-140. Patient denies headaches, dizziness or  vision changes. Continue metoprolol, benazepril.  Disposition: Return in 1 year or sooner as needed.          Signed, Etta Grandchild. Joeann Steppe, DNP, NP-C

## 2023-01-20 ENCOUNTER — Ambulatory Visit: Payer: Medicare HMO | Admitting: Student

## 2023-01-20 ENCOUNTER — Other Ambulatory Visit (HOSPITAL_COMMUNITY): Payer: Self-pay | Admitting: Family Medicine

## 2023-01-20 ENCOUNTER — Encounter: Payer: Self-pay | Admitting: Student

## 2023-01-20 VITALS — BP 148/72 | HR 67 | Ht 64.0 in | Wt 164.0 lb

## 2023-01-20 DIAGNOSIS — I350 Nonrheumatic aortic (valve) stenosis: Secondary | ICD-10-CM

## 2023-01-20 DIAGNOSIS — I25810 Atherosclerosis of coronary artery bypass graft(s) without angina pectoris: Secondary | ICD-10-CM | POA: Diagnosis not present

## 2023-01-20 DIAGNOSIS — I10 Essential (primary) hypertension: Secondary | ICD-10-CM | POA: Diagnosis not present

## 2023-01-20 DIAGNOSIS — R42 Dizziness and giddiness: Secondary | ICD-10-CM | POA: Diagnosis not present

## 2023-01-20 NOTE — Patient Instructions (Signed)
Medication Instructions:  No Chagnes *If you need a refill on your cardiac medications before your next appointment, please call your pharmacy*   Lab Work: No Labs If you have labs (blood work) drawn today and your tests are completely normal, you will receive your results only by: MyChart Message (if you have MyChart) OR A paper copy in the mail If you have any lab test that is abnormal or we need to change your treatment, we will call you to review the results.   Testing/Procedures: No Testing   Follow-Up: At [facility], you and your health needs are our priority.  As part of our continuing mission to provide you with exceptional heart care, we have created designated Provider Care Teams.  These Care Teams include your primary Cardiologist (physician) and Advanced Practice Providers (APPs -  Physician Assistants and Nurse Practitioners) who all work together to provide you with the care you need, when you need it.  We recommend signing up for the patient portal called "MyChart".  Sign up information is provided on this After Visit Summary.  MyChart is used to connect with patients for Virtual Visits (Telemedicine).  Patients are able to view lab/test results, encounter notes, upcoming appointments, etc.  Non-urgent messages can be sent to your provider as well.   To learn more about what you can do with MyChart, go to [email or web address].    Your next appointment:   1 year(s)  Provider:   Nicki Guadalajara, MD

## 2023-02-02 ENCOUNTER — Other Ambulatory Visit: Payer: Self-pay

## 2023-02-02 ENCOUNTER — Emergency Department (HOSPITAL_COMMUNITY)
Admission: EM | Admit: 2023-02-02 | Discharge: 2023-02-02 | Disposition: A | Discharge status: Home / Self Care | Payer: Medicare HMO | Attending: Emergency Medicine | Admitting: Emergency Medicine

## 2023-02-02 ENCOUNTER — Encounter (HOSPITAL_COMMUNITY): Payer: Self-pay

## 2023-02-02 DIAGNOSIS — S60561A Insect bite (nonvenomous) of right hand, initial encounter: Secondary | ICD-10-CM | POA: Insufficient documentation

## 2023-02-02 DIAGNOSIS — W57XXXA Bitten or stung by nonvenomous insect and other nonvenomous arthropods, initial encounter: Secondary | ICD-10-CM | POA: Insufficient documentation

## 2023-02-02 DIAGNOSIS — S30861A Insect bite (nonvenomous) of abdominal wall, initial encounter: Secondary | ICD-10-CM | POA: Diagnosis not present

## 2023-02-02 DIAGNOSIS — S60562A Insect bite (nonvenomous) of left hand, initial encounter: Secondary | ICD-10-CM | POA: Insufficient documentation

## 2023-02-02 DIAGNOSIS — T63441A Toxic effect of venom of bees, accidental (unintentional), initial encounter: Secondary | ICD-10-CM | POA: Diagnosis not present

## 2023-02-02 DIAGNOSIS — T63481A Toxic effect of venom of other arthropod, accidental (unintentional), initial encounter: Secondary | ICD-10-CM

## 2023-02-02 MED ORDER — PREDNISONE 20 MG PO TABS
60.0000 mg | ORAL_TABLET | Freq: Once | ORAL | Status: AC
  Administered 2023-02-02: 60 mg via ORAL
  Filled 2023-02-02: qty 3

## 2023-02-02 MED ORDER — OXYCODONE-ACETAMINOPHEN 5-325 MG PO TABS
1.0000 | ORAL_TABLET | Freq: Once | ORAL | Status: AC
  Administered 2023-02-02: 1 via ORAL
  Filled 2023-02-02: qty 1

## 2023-02-02 MED ORDER — ACETAMINOPHEN 500 MG PO TABS: 1000.0000 mg | ORAL_TABLET | Freq: Four times a day (QID) | ORAL | 0 refills | Status: DC | PRN

## 2023-02-02 MED ORDER — FAMOTIDINE 20 MG PO TABS: 20.0000 mg | ORAL_TABLET | Freq: Every day | ORAL | 0 refills | Status: DC

## 2023-02-02 MED ORDER — ACETAMINOPHEN 325 MG PO TABS
650.0000 mg | ORAL_TABLET | Freq: Once | ORAL | Status: AC
  Administered 2023-02-02: 650 mg via ORAL
  Filled 2023-02-02: qty 2

## 2023-02-02 NOTE — ED Provider Notes (Signed)
[place] EMERGENCY DEPARTMENT AT [facility] Provider Note   CSN: [id number] Arrival date & time: 02/02/23  1729     History  Chief Complaint  Patient presents with   Allergic Reaction    Jared Dunlap is a 89 y.o. male.   Allergic Reaction Patient is an 89 year old male present emergency room today for pain and itching after being stung by multiple yellow jackets earlier today.  He states that this occurred approximately 3 hours before this note was written--stings at 3 PM--he took 50 mg of Benadryl and 1 dose of Pepcid and came to the emergency room.  He denies any tightness in his throat or difficulty swallowing.  No nausea vomiting or diarrhea.     Home Medications Prior to Admission medications   Medication Sig Start Date End Date Taking? Authorizing Provider  acetaminophen (TYLENOL) 500 MG tablet Take 2 tablets (1,000 mg total) by mouth every 6 (six) hours as needed. 02/02/23  Yes Miho Monda S, PA  BENADRYL ALLERGY 25 MG capsule Take 25-50 mg by mouth every 6 (six) hours as needed (for allergic reactions).   Yes [provider]  benazepril (LOTENSIN) 40 MG tablet TAKE 1 TABLET EVERY DAY Patient taking differently: Take 40 mg by mouth daily. 05/12/17  Yes Lennette Bihari, MD  Coenzyme Q10 (COQ10) 100 MG CAPS Take 100 mg by mouth daily.   Yes [provider]  famotidine (PEPCID) 20 MG tablet Take 1 tablet (20 mg total) by mouth daily for 5 days. 02/02/23 02/07/23 Yes Malvina Schadler S, PA  famotidine (PEPCID) 20 MG tablet Take 20 mg by mouth 2 (two) times daily as needed (for allergic symptoms, in conjunction with Benadryl).   Yes [provider]  Flaxseed, Linseed, (FLAX SEEDS PO) Take 1 capsule by mouth daily.   Yes [provider]  folic acid (FOLVITE) 400 MCG tablet Take 1 tablet (400 mcg total) by mouth daily. 07/20/13  Yes Lennette Bihari, MD  Glucosamine-Chondroitin (OSTEO BI-FLEX REGULAR STRENGTH PO) Take 1 tablet by mouth  daily.   Yes [provider]  Lecithin 1200 MG CAPS Take 1,200 mg by mouth daily.   Yes [provider]  metoprolol tartrate (LOPRESSOR) 25 MG tablet TAKE 1/2 TABLET TWICE DAILY Patient taking differently: Take 12.5 mg by mouth 2 (two) times daily. 05/12/17  Yes Lennette Bihari, MD  pyridOXINE (B-6) 50 MG tablet Take 50 mg by mouth daily.   Yes [provider]  tamsulosin (FLOMAX) 0.4 MG CAPS capsule Take 0.4 mg by mouth at bedtime.   Yes [provider]  vitamin B-12 (CYANOCOBALAMIN) 500 MCG tablet Take 500 mcg by mouth daily.   Yes [provider]  vitamin C (ASCORBIC ACID) 500 MG tablet Take 500 mg by mouth daily.   Yes [provider]  Bempedoic Acid (NEXLETOL) 180 MG TABS Take 1 tablet by mouth daily. Patient not taking: Reported on 01/20/2023 04/24/20   Lennette Bihari, MD      Allergies    Atorvastatin calcium, Ezetimibe, Ezetimibe-simvastatin, Fluvastatin, Pravastatin sodium, Rosuvastatin, Simvastatin, Welchol [colesevelam hcl], and Yellow jacket venom    Review of Systems   Review of Systems  Physical Exam Updated Vital Signs BP (!) 174/83   Pulse 69   Temp 98.2 F (36.8 C) (Oral)   Resp 18   Ht 5\' 4"  (1.626 m)   Wt 74.4 kg   SpO2 100%   BMI 28.15 kg/m  Physical Exam Vitals and nursing note  reviewed.  Constitutional:      General: He is not in acute distress. HENT:     Head: Normocephalic and atraumatic.     Nose: Nose normal.  Eyes:     General: No scleral icterus. Cardiovascular:     Rate and Rhythm: Normal rate and regular rhythm.     Pulses: Normal pulses.     Heart sounds: Normal heart sounds.  Pulmonary:     Effort: Pulmonary effort is normal. No respiratory distress.     Breath sounds: No wheezing.  Abdominal:     Palpations: Abdomen is soft.     Tenderness: There is no abdominal tenderness.  Musculoskeletal:     Cervical back: Normal range of motion.     Right lower leg: No edema.     Left lower  leg: No edema.  Skin:    General: Skin is warm and dry.     Capillary Refill: Capillary refill takes less than 2 seconds.     Comments: Bilateral hands, bilateral posterior calves/ankles and left lower abdomen with insect stings that are red and somewhat tender to touch.  Neurological:     Mental Status: He is alert. Mental status is at baseline.  Psychiatric:        Mood and Affect: Mood normal.        Behavior: Behavior normal.     ED Results / Procedures / Treatments   Labs (all labs ordered are listed, but only abnormal results are displayed) Labs Reviewed - No data to display  EKG None  Radiology No results found.  Procedures Procedures    Medications Ordered in ED Medications  predniSONE (DELTASONE) tablet 60 mg (60 mg Oral Given 02/02/23 1759)  oxyCODONE-acetaminophen (PERCOCET/ROXICET) 5-325 MG per tablet 1 tablet (1 tablet Oral Given 02/02/23 1759)  acetaminophen (TYLENOL) tablet 650 mg (650 mg Oral Given 02/02/23 1759)    ED Course/ Medical Decision Making/ A&P                                  Medical Decision Making Risk OTC drugs. Prescription drug management.    Patient with localized inflammation/erythema around yellowjacket stings to bilateral hands, left lower abdomen, bilateral lower extremities  No wheezing, no nausea or vomiting, no difficulty swallowing or breathing.  No indication of anaphylaxis.  Patient symptoms started 3 hours ago after he was stung.  Will give p.o. prednisone.  Has already had Pepcid and Benadryl.  The patient kept for observation for several hours here in the emergency room.  Was given a dose of prednisone and Tylenol/Percocet.  Patient feels immensely improved.  Will discharge home with Tylenol and Pepcid and recommendations to take Benadryl as needed every 6 hours for itching.  Return precautions discussed.  Cold compresses recommended  Final Clinical Impression(s) / ED Diagnoses Final diagnoses:  Insect stings,  accidental or unintentional, initial encounter    Rx / DC Orders ED Discharge Orders          Ordered    acetaminophen (TYLENOL) 500 MG tablet  Every 6 hours PRN        02/02/23 2012    famotidine (PEPCID) 20 MG tablet  Daily        02/02/23 2012              Solon Augusta [place], [place] 02/02/23 2250    Charlynne Pander, MD 02/02/23 2306

## 2023-02-02 NOTE — Discharge Instructions (Addendum)
I have written a prescription for 2 over-the-counter medications which I would like you to take.  Tylenol 1000 mg every 6 hours as needed for pain  Pepcid 20 mg once daily for the next 3-5 days as needed for itching and irritation around the insect stings.  Cold compresses to the insect stings.  Take Benadryl 25 mg - 50 mg every 6 hours as needed but only use if you are having significant itching.

## 2023-02-02 NOTE — ED Triage Notes (Signed)
Pt coming in after being stung multiple times by bees in several different locations across his lower body. PT took 2 benadryl, and Pepcid at apprx 1530. Swelling noted in abd, legs, and hands. Pt denies any tightness in his throat, and is able to speak in full sentences.

## 2023-02-09 ENCOUNTER — Other Ambulatory Visit (HOSPITAL_COMMUNITY): Payer: Self-pay | Admitting: Family Medicine

## 2023-02-09 DIAGNOSIS — R42 Dizziness and giddiness: Secondary | ICD-10-CM

## 2023-02-12 ENCOUNTER — Ambulatory Visit (HOSPITAL_BASED_OUTPATIENT_CLINIC_OR_DEPARTMENT_OTHER): Payer: Medicare HMO

## 2023-02-12 ENCOUNTER — Ambulatory Visit (HOSPITAL_COMMUNITY)
Admission: RE | Admit: 2023-02-12 | Discharge: 2023-02-12 | Disposition: A | Discharge status: Home / Self Care | Payer: Medicare HMO | Source: Ambulatory Visit | Attending: Cardiology | Admitting: Cardiology

## 2023-02-12 DIAGNOSIS — R42 Dizziness and giddiness: Secondary | ICD-10-CM | POA: Insufficient documentation

## 2023-02-12 DIAGNOSIS — I5030 Unspecified diastolic (congestive) heart failure: Secondary | ICD-10-CM

## 2023-02-12 DIAGNOSIS — I350 Nonrheumatic aortic (valve) stenosis: Secondary | ICD-10-CM | POA: Insufficient documentation

## 2023-02-12 DIAGNOSIS — I080 Rheumatic disorders of both mitral and aortic valves: Secondary | ICD-10-CM

## 2023-02-12 LAB — ECHOCARDIOGRAM COMPLETE
AR max vel: 1.17 cm2
AV Area VTI: 1.13 cm2
AV Area mean vel: 1.08 cm2
AV Mean grad: 14 mmHg
AV Peak grad: 24.6 mmHg
Ao pk vel: 2.48 m/s
Area-P 1/2: 2.26 cm2
MV VTI: 1.56 cm2
S' Lateral: 3.3 cm

## 2023-04-07 DIAGNOSIS — H2513 Age-related nuclear cataract, bilateral: Secondary | ICD-10-CM | POA: Diagnosis not present

## 2023-04-07 DIAGNOSIS — H353131 Nonexudative age-related macular degeneration, bilateral, early dry stage: Secondary | ICD-10-CM | POA: Diagnosis not present

## 2023-04-07 DIAGNOSIS — H5211 Myopia, right eye: Secondary | ICD-10-CM | POA: Diagnosis not present

## 2023-04-09 DIAGNOSIS — R051 Acute cough: Secondary | ICD-10-CM | POA: Diagnosis not present

## 2023-04-09 DIAGNOSIS — U071 COVID-19: Secondary | ICD-10-CM | POA: Diagnosis not present

## 2023-04-09 DIAGNOSIS — Z03818 Encounter for observation for suspected exposure to other biological agents ruled out: Secondary | ICD-10-CM | POA: Diagnosis not present

## 2023-04-09 DIAGNOSIS — J209 Acute bronchitis, unspecified: Secondary | ICD-10-CM | POA: Diagnosis not present

## 2023-05-04 DIAGNOSIS — M1611 Unilateral primary osteoarthritis, right hip: Secondary | ICD-10-CM | POA: Diagnosis not present

## 2023-05-05 ENCOUNTER — Telehealth: Payer: Self-pay

## 2023-05-05 NOTE — Telephone Encounter (Signed)
   Pre-operative Risk Assessment    Patient Name: Jared Dunlap  DOB: 06/19/1932 MRN: [id number]      Request for Surgical Clearance    Procedure:   Right total hip arthroplasty  Date of Surgery:  Clearance TBD                                 Surgeon:  Dr. Samson Frederic Surgeon's Group or Practice Name:  [facility] Phone number:  [phone] Fax number:  [phone]   Type of Clearance Requested:   - Medical    Type of Anesthesia:  Spinal   Additional requests/questions:    Garrel Ridgel   05/05/2023, 2:56 PM

## 2023-05-06 NOTE — Telephone Encounter (Signed)
   Name: Jared Dunlap  DOB: 08-Jul-1931  MRN: [id number]  Primary Cardiologist: Nicki Guadalajara, MD   Preoperative team, please contact this patient and set up a phone call appointment for further preoperative risk assessment. Please obtain consent and complete medication review. Thank you for your help.  I confirm that guidance regarding antiplatelet and oral anticoagulation therapy has been completed and, if necessary, noted below.  None  I also confirmed the patient resides in the state of [place]. As per [facility] Medical Board telemedicine laws, the patient must reside in the state in which the provider is licensed.   Napoleon Form, Leodis Rains, NP 05/06/2023, 9:33 AM [facility]

## 2023-05-06 NOTE — Telephone Encounter (Signed)
Tried to call the pt to schedule a tele preop appt though no answer.

## 2023-05-07 DIAGNOSIS — Z23 Encounter for immunization: Secondary | ICD-10-CM | POA: Diagnosis not present

## 2023-05-07 DIAGNOSIS — Z0000 Encounter for general adult medical examination without abnormal findings: Secondary | ICD-10-CM | POA: Diagnosis not present

## 2023-05-07 DIAGNOSIS — R3911 Hesitancy of micturition: Secondary | ICD-10-CM | POA: Diagnosis not present

## 2023-05-07 DIAGNOSIS — I2510 Atherosclerotic heart disease of native coronary artery without angina pectoris: Secondary | ICD-10-CM | POA: Diagnosis not present

## 2023-05-07 DIAGNOSIS — M1990 Unspecified osteoarthritis, unspecified site: Secondary | ICD-10-CM | POA: Diagnosis not present

## 2023-05-07 DIAGNOSIS — E039 Hypothyroidism, unspecified: Secondary | ICD-10-CM | POA: Diagnosis not present

## 2023-05-07 DIAGNOSIS — N1831 Chronic kidney disease, stage 3a: Secondary | ICD-10-CM | POA: Diagnosis not present

## 2023-05-07 DIAGNOSIS — I10 Essential (primary) hypertension: Secondary | ICD-10-CM | POA: Diagnosis not present

## 2023-05-07 DIAGNOSIS — D696 Thrombocytopenia, unspecified: Secondary | ICD-10-CM | POA: Diagnosis not present

## 2023-05-07 DIAGNOSIS — I700 Atherosclerosis of aorta: Secondary | ICD-10-CM | POA: Diagnosis not present

## 2023-05-07 NOTE — Telephone Encounter (Signed)
2nd attempt to reach pt to schedule tele visit. Lvm

## 2023-05-08 ENCOUNTER — Telehealth: Payer: Self-pay | Admitting: *Deleted

## 2023-05-08 ENCOUNTER — Telehealth: Payer: Self-pay | Admitting: Cardiovascular Disease

## 2023-05-08 NOTE — Telephone Encounter (Signed)
Delane Ginger, Beverly1 hour ago (8:49 AM)   BG Pt returning call to nurse      Note   Tyquise, Lebaron [phone]  Andreas Blower    I s/w the pt today and he has been scheduled for a tele pre op appt 05/19/23. Med rec and consent are done. Pt is upset that this is our first availability. I tried to explain to the pt that it the end of the year and people have met their deductibles and trying to get surgery done before the end of the year. Unfortunately the schedules are very full. I assured the pt that if I get a cancellation I will call him, otherwise tele appt 05/19/23. I also explained that the surgeon office did not give a tentative date for surgery.   Med rec and consent are done.

## 2023-05-08 NOTE — Telephone Encounter (Signed)
Jared Dunlap, Beverly1 hour ago (8:49 AM)   BG Pt returning call to nurse      Note   Blaze, [place] [phone]  Andreas Blower    I s/w the pt today and he has been scheduled for a tele pre op appt 05/19/23. Med rec and consent are done. Pt is upset that this is our first availability. I tried to explain to the pt that it the end of the year and people have met their deductibles and trying to get surgery done before the end of the year. Unfortunately the schedules are very full. I assured the pt that if I get a cancellation I will call him, otherwise tele appt 05/19/23. I also explained that the surgeon office did not give a tentative date for surgery.   Med rec and consent are done.     Patient Consent for Virtual Visit        Jared Dunlap has provided verbal consent on 05/08/2023 for a virtual visit (video or telephone).   CONSENT FOR VIRTUAL VISIT FOR:  Consepcion Hearing  By participating in this virtual visit I agree to the following:  I hereby voluntarily request, consent and authorize [facility] and its employed or contracted physicians, physician assistants, nurse practitioners or other licensed health care professionals (the Practitioner), to provide me with telemedicine health care services (the "Services") as deemed necessary by the treating Practitioner. I acknowledge and consent to receive the Services by the Practitioner via telemedicine. I understand that the telemedicine visit will involve communicating with the Practitioner through live audiovisual communication technology and the disclosure of certain medical information by electronic transmission. I acknowledge that I have been given the opportunity to request an in-person assessment or other available alternative prior to the telemedicine visit and am voluntarily participating in the telemedicine visit.  I understand that I have the right to withhold or withdraw my consent to the use of telemedicine in the  course of my care at any time, without affecting my right to future care or treatment, and that the Practitioner or I may terminate the telemedicine visit at any time. I understand that I have the right to inspect all information obtained and/or recorded in the course of the telemedicine visit and may receive copies of available information for a reasonable fee.  I understand that some of the potential risks of receiving the Services via telemedicine include:  Delay or interruption in medical evaluation due to technological equipment failure or disruption; Information transmitted may not be sufficient (e.g. poor resolution of images) to allow for appropriate medical decision making by the Practitioner; and/or  In rare instances, security protocols could fail, causing a breach of personal health information.  Furthermore, I acknowledge that it is my responsibility to provide information about my medical history, conditions and care that is complete and accurate to the best of my ability. I acknowledge that Practitioner's advice, recommendations, and/or decision may be based on factors not within their control, such as incomplete or inaccurate data provided by me or distortions of diagnostic images or specimens that may result from electronic transmissions. I understand that the practice of medicine is not an exact science and that Practitioner makes no warranties or guarantees regarding treatment outcomes. I acknowledge that a copy of this consent can be made available to me via my patient portal [facility] MyChart), or I can request a printed copy by calling the office of [facility].    I understand that  my insurance will be billed for this visit.   I have read or had this consent read to me. I understand the contents of this consent, which adequately explains the benefits and risks of the Services being provided via telemedicine.  I have been provided ample opportunity to ask questions  regarding this consent and the Services and have had my questions answered to my satisfaction. I give my informed consent for the services to be provided through the use of telemedicine in my medical care

## 2023-05-08 NOTE — Telephone Encounter (Signed)
Pt returning call to nurse

## 2023-05-08 NOTE — Telephone Encounter (Signed)
CMA ENTERED IN WRONG FAX # FOR PRE OP CLEARANCE.   CORRECT FAX # [phone]

## 2023-05-11 ENCOUNTER — Encounter: Payer: Self-pay | Admitting: Cardiovascular Disease

## 2023-05-11 NOTE — Telephone Encounter (Signed)
Pam, Daughter, has been made aware that we have moved pt's appointment up to 05/12/23 3:00.  She was very thankful / Adult nurse.

## 2023-05-12 ENCOUNTER — Ambulatory Visit: Payer: Medicare HMO | Attending: Cardiology

## 2023-05-12 DIAGNOSIS — Z01810 Encounter for preprocedural cardiovascular examination: Secondary | ICD-10-CM

## 2023-05-12 DIAGNOSIS — Z01818 Encounter for other preprocedural examination: Secondary | ICD-10-CM

## 2023-05-12 NOTE — Progress Notes (Addendum)
Virtual Visit via Telephone Note   Because of Jared Dunlap's co-morbid illnesses, he is at least at moderate risk for complications without adequate follow up.  This format is felt to be most appropriate for this patient at this time.  The patient did not have access to video technology/had technical difficulties with video requiring transitioning to audio format only (telephone).  All issues noted in this document were discussed and addressed.  No physical exam could be performed with this format.  Please refer to the patient's chart for his consent to telehealth for [facility].  Evaluation Performed:  Preoperative cardiovascular risk assessment _____________   Date:  05/12/2023   Patient ID:  Jared Dunlap, DOB 10-14-1931, MRN [id number] Patient Location:  Home Provider location:   Office  Primary Care Provider:  Irven Coe, MD Primary Cardiologist:  Nicki Guadalajara, MD  Chief Complaint / Patient Profile   89 y.o. y/o male with a h/o dizziness, coronary artery status post CABG x 6 in August 1998, PCI with bare-metal stent to native left circumflex September 2001, aortic valve stenosis, grade 2 diastolic dysfunction with moderate aortic valve stenosis with a mean gradient of 14 mmHg, and hypertension.  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  He is pending right total hip arthroplasty by Dr. Samson Frederic on date to be determined and presents today for telephonic preoperative cardiovascular risk assessment.   History of Present Illness    Jared Dunlap is a 89 y.o. male who presents via audio/video conferencing for a telehealth visit today.  Pt was last seen in cardiology clinic on 01/20/2023 by Carlos Levering, DNP.   At that time Jared Dunlap was doing well. He offers no complaints of chest pain, shortness of breath, or swelling. He is usually physically active playing golf up until a few weeks ago. The patient is now  pending procedure as outlined above. Since his last visit, he   Past Medical History    Past Medical History:  Diagnosis Date   CAD (coronary artery disease) of artery bypass graft 04/18/2013   CABG x 6 1998; PCI to native LCx 2001    CAD in native artery 02/05/2016   Coronary artery disease    followed by Dr. Tresa Endo - last office visit 06/08/13   Essential hypertension 04/18/2013   History of kidney stones    Hyperlipidemia with target LDL less than 70 04/18/2013   Hypertension    MI (myocardial infarction) (HCC) 1998    x2 MI's   Past Surgical History:  Procedure Laterality Date   BYPASS GRAFT  1998   CORONARY ARTERY BYPASS GRAFT  1998   6 vessels   CYSTOSCOPY WITH RETROGRADE PYELOGRAM, URETEROSCOPY AND STENT PLACEMENT Right 05/11/2013   Procedure: CYSTOSCOPY WITH RIGHT RETROGRADE PYELOGRAM,CYSTOLITHOPAXY/DIAGNOSTIC  DIGITAL FLEXIBLE RIGHT URETEROSCOPY WITH STENT PLACEMENT;  Surgeon: Sebastian Ache, MD;  Location: [facility];  Service: Urology;  Laterality: Right;   CYSTOSCOPY WITH RETROGRADE PYELOGRAM, URETEROSCOPY AND STENT PLACEMENT Right 06/10/2013   Procedure: CYSTOSCOPY WITH RETROGRADE PYELOGRAM, URETEROSCOPY AND STENT PLACEMENT;  Surgeon: Sebastian Ache, MD;  Location: [facility];  Service: Urology;  Laterality: Right;   INNER EAR SURGERY     JOINT REPLACEMENT     Left hip   KIDNEY STONE SURGERY  1966   stented coronary artery  1999   TONSILLECTOMY      Allergies  Allergies  Allergen Reactions   Atorvastatin Calcium Other (See  Comments)    Caused forgetfulness,and made the joints hurt   Ezetimibe Other (See Comments)    Caused forgetfulness,and made the joints hurt   Ezetimibe-Simvastatin Other (See Comments)    Caused forgetfulness,and made the joints hurt   Fluvastatin Other (See Comments)    Name brand is Lescol Caused forgetfulness,and made the joints hurt   Pravastatin Sodium Other (See Comments)    Caused forgetfulness,and made the joints hurt   Rosuvastatin  Other (See Comments)    Caused forgetfulness,and made the joints hurt   Simvastatin Other (See Comments)    Caused forgetfulness,and made the joints hurt   Welchol [Colesevelam Hcl] Other (See Comments)   Yellow Jacket Venom Other (See Comments)    Extreme pain, hands and ankles became swollen, oozes at site stung    Home Medications    Prior to Admission medications   Medication Sig Start Date End Date Taking? Authorizing Provider  acetaminophen (TYLENOL) 500 MG tablet Take 2 tablets (1,000 mg total) by mouth every 6 (six) hours as needed. 02/02/23   Gailen Shelter, PA  Bempedoic Acid (NEXLETOL) 180 MG TABS Take 1 tablet by mouth daily. Patient not taking: Reported on 01/20/2023 04/24/20   Lennette Bihari, MD  BENADRYL ALLERGY 25 MG capsule Take 25-50 mg by mouth every 6 (six) hours as needed (for allergic reactions).    [provider]  benazepril (LOTENSIN) 40 MG tablet TAKE 1 TABLET EVERY DAY Patient taking differently: Take 40 mg by mouth daily. 05/12/17   Lennette Bihari, MD  Coenzyme Q10 (COQ10) 100 MG CAPS Take 100 mg by mouth daily.    [provider]  famotidine (PEPCID) 20 MG tablet Take 1 tablet (20 mg total) by mouth daily for 5 days. 02/02/23 02/07/23  Gailen Shelter, PA  famotidine (PEPCID) 20 MG tablet Take 20 mg by mouth 2 (two) times daily as needed (for allergic symptoms, in conjunction with Benadryl).    [provider]  Flaxseed, Linseed, (FLAX SEEDS PO) Take 1 capsule by mouth daily.    [provider]  folic acid (FOLVITE) 400 MCG tablet Take 1 tablet (400 mcg total) by mouth daily. 07/20/13   Lennette Bihari, MD  Glucosamine-Chondroitin (OSTEO BI-FLEX REGULAR STRENGTH PO) Take 1 tablet by mouth daily.    [provider]  Lecithin 1200 MG CAPS Take 1,200 mg by mouth daily.    [provider]  metoprolol tartrate (LOPRESSOR) 25 MG tablet TAKE 1/2 TABLET TWICE DAILY Patient taking differently: Take 12.5 mg by mouth 2  (two) times daily. 05/12/17   Lennette Bihari, MD  pyridOXINE (B-6) 50 MG tablet Take 50 mg by mouth daily.    [provider]  tamsulosin (FLOMAX) 0.4 MG CAPS capsule Take 0.4 mg by mouth at bedtime.    [provider]  vitamin B-12 (CYANOCOBALAMIN) 500 MCG tablet Take 500 mcg by mouth daily.    [provider]  vitamin C (ASCORBIC ACID) 500 MG tablet Take 500 mg by mouth daily.    [provider]    Physical Exam    Vital Signs:  Jared Dunlap does not have vital signs available for review today.   Given telephonic nature of communication, physical exam is limited. AAOx3. NAD. Normal affect.  Speech and respirations are unlabored.  Accessory Clinical Findings    None  Assessment & Plan    1.  Preoperative Cardiovascular Risk Assessment: According to the Revised Cardiac Risk Index (RCRI), his Perioperative  Risk of Major Cardiac Event is (%): 11  His Functional Capacity in METs is: 8.91 according to the [facility] Activity Status Index (DASI).   The patient was advised that if he develops new symptoms prior to surgery to contact our office to arrange for a follow-up visit, and he verbalized understanding.  Therefore, based on ACC/AHA guidelines, patient would be at acceptable risk for the planned procedure without further cardiovascular testing. I will route this recommendation to the requesting party via Epic fax function.   A copy of this note will be routed to requesting surgeon.  Time:   Today, I have spent 10 minutes with the patient with telehealth technology discussing medical history, symptoms, and management plan.     Joni Reining, NP   05/12/2023, 3:04 PM

## 2023-05-19 ENCOUNTER — Telehealth: Payer: Medicare HMO

## 2023-06-04 ENCOUNTER — Ambulatory Visit: Payer: Self-pay | Admitting: Student

## 2023-06-04 NOTE — Progress Notes (Signed)
Sent message, via epic in basket, requesting orders in epic from surgeon.  

## 2023-06-10 NOTE — Patient Instructions (Signed)
DUE TO COVID-19 ONLY TWO VISITORS  (aged 87 and older)  ARE ALLOWED TO COME WITH YOU AND STAY IN THE WAITING ROOM ONLY DURING PRE OP AND PROCEDURE.   **NO VISITORS ARE ALLOWED IN THE SHORT STAY AREA OR RECOVERY ROOM!!**  IF YOU WILL BE ADMITTED INTO THE HOSPITAL YOU ARE ALLOWED ONLY FOUR SUPPORT PEOPLE DURING VISITATION HOURS ONLY (7 AM -8PM)   The support person(s) must pass our screening, gel in and out, and wear a mask at all times, including in the patient's room. Patients must also wear a mask when staff or their support person are in the room. Visitors GUEST BADGE MUST BE WORN VISIBLY  One adult visitor may remain with you overnight and MUST be in the room by 8 P.M.     Your procedure is scheduled on: 06/17/23   Report to [facility] Main Entrance    Report to admitting at : 11:00 AM   Call this number if you have problems the morning of surgery [phone]   Do not eat food :After Midnight.   After Midnight you may have the following liquids until : 10:30 AM DAY OF SURGERY  Water Black Coffee (sugar ok, NO MILK/CREAM OR CREAMERS)  Tea (sugar ok, NO MILK/CREAM OR CREAMERS) regular and decaf                             Plain Jell-O (NO RED)                                           Fruit ices (not with fruit pulp, NO RED)                                     Popsicles (NO RED)                                                                  Juice: apple, WHITE grape, WHITE cranberry Sports drinks like Gatorade (NO RED)   The day of surgery:  Drink ONE (1) Pre-Surgery Clear Ensure at : 10:30 AM the morning of surgery. Drink in one sitting. Do not sip.  This drink was given to you during your hospital  pre-op appointment visit. Nothing else to drink after completing the  Pre-Surgery Clear Ensure or G2.          If you have questions, please contact your surgeon's office.  FOLLOW ANY ADDITIONAL PRE OP INSTRUCTIONS YOU RECEIVED FROM YOUR SURGEON'S OFFICE!!!   Oral  Hygiene is also important to reduce your risk of infection.                                    Remember - BRUSH YOUR TEETH THE MORNING OF SURGERY WITH YOUR REGULAR TOOTHPASTE  DENTURES WILL BE REMOVED PRIOR TO SURGERY PLEASE DO NOT APPLY "Poly grip" OR ADHESIVES!!!   Do NOT smoke after Midnight   Take these medicines the morning of surgery  with A SIP OF WATER: metoprolol.Tylenol as needed.  DO NOT TAKE ANY ORAL DIABETIC MEDICATIONS DAY OF YOUR SURGERY  Bring CPAP mask and tubing day of surgery.                              You may not have any metal on your body including hair pins, jewelry, and body piercing             Do not wear lotions, powders, perfumes/cologne, or deodorant              Men may shave face and neck.   Do not bring valuables to the hospital. [facility] IS NOT             RESPONSIBLE   FOR VALUABLES.   Contacts, glasses, or bridgework may not be worn into surgery.   Bring small overnight bag day of surgery.   DO NOT BRING YOUR HOME MEDICATIONS TO THE HOSPITAL. PHARMACY WILL DISPENSE MEDICATIONS LISTED ON YOUR MEDICATION LIST TO YOU DURING YOUR ADMISSION IN THE HOSPITAL!    Patients discharged on the day of surgery will not be allowed to drive home.  Someone NEEDS to stay with you for the first 24 hours after anesthesia.   Special Instructions: Bring a copy of your healthcare power of attorney and living will documents         the day of surgery if you haven't scanned them before.              Please read over the following fact sheets you were given: IF YOU HAVE QUESTIONS ABOUT YOUR PRE-OP INSTRUCTIONS PLEASE CALL [phone]      Pre-operative 5 CHG Bath Instructions   You can play a key role in reducing the risk of infection after surgery. Your skin needs to be as free of germs as possible. You can reduce the number of germs on your skin by washing with CHG (chlorhexidine gluconate) soap before surgery. CHG is an antiseptic soap that kills germs and  continues to kill germs even after washing.   DO NOT use if you have an allergy to chlorhexidine/CHG or antibacterial soaps. If your skin becomes reddened or irritated, stop using the CHG and notify one of our RNs at : [phone].   Please shower with the CHG soap starting 4 days before surgery using the following schedule:     Please keep in mind the following:  DO NOT shave, including legs and underarms, starting the day of your first shower.   You may shave your face at any point before/day of surgery.  Place clean sheets on your bed the day you start using CHG soap. Use a clean washcloth (not used since being washed) for each shower. DO NOT sleep with pets once you start using the CHG.   CHG Shower Instructions:  If you choose to wash your hair and private area, wash first with your normal shampoo/soap.  After you use shampoo/soap, rinse your hair and body thoroughly to remove shampoo/soap residue.  Turn the water OFF and apply about 3 tablespoons (45 ml) of CHG soap to a CLEAN washcloth.  Apply CHG soap ONLY FROM YOUR NECK DOWN TO YOUR TOES (washing for 3-5 minutes)  DO NOT use CHG soap on face, private areas, open wounds, or sores.  Pay special attention to the area where your surgery is being performed.  If you are having back surgery,  having someone wash your back for you may be helpful. Wait 2 minutes after CHG soap is applied, then you may rinse off the CHG soap.  Pat dry with a clean towel  Put on clean clothes/pajamas   If you choose to wear lotion, please use ONLY the CHG-compatible lotions on the back of this paper.     Additional instructions for the day of surgery: DO NOT APPLY any lotions, deodorants, cologne, or perfumes.   Put on clean/comfortable clothes.  Brush your teeth.  Ask your nurse before applying any prescription medications to the skin.   CHG Compatible Lotions   Aveeno Moisturizing lotion  Cetaphil Moisturizing Cream  Cetaphil Moisturizing  Lotion  Clairol Herbal Essence Moisturizing Lotion, Dry Skin  Clairol Herbal Essence Moisturizing Lotion, Extra Dry Skin  Clairol Herbal Essence Moisturizing Lotion, Normal Skin  Curel Age Defying Therapeutic Moisturizing Lotion with Alpha Hydroxy  Curel Extreme Care Body Lotion  Curel Soothing Hands Moisturizing Hand Lotion  Curel Therapeutic Moisturizing Cream, Fragrance-Free  Curel Therapeutic Moisturizing Lotion, Fragrance-Free  Curel Therapeutic Moisturizing Lotion, Original Formula  Eucerin Daily Replenishing Lotion  Eucerin Dry Skin Therapy Plus Alpha Hydroxy Crme  Eucerin Dry Skin Therapy Plus Alpha Hydroxy Lotion  Eucerin Original Crme  Eucerin Original Lotion  Eucerin Plus Crme Eucerin Plus Lotion  Eucerin TriLipid Replenishing Lotion  Keri Anti-Bacterial Hand Lotion  Keri Deep Conditioning Original Lotion Dry Skin Formula Softly Scented  Keri Deep Conditioning Original Lotion, Fragrance Free Sensitive Skin Formula  Keri Lotion Fast Absorbing Fragrance Free Sensitive Skin Formula  Keri Lotion Fast Absorbing Softly Scented Dry Skin Formula  Keri Original Lotion  Keri Skin Renewal Lotion Keri Silky Smooth Lotion  Keri Silky Smooth Sensitive Skin Lotion  Nivea Body Creamy Conditioning Oil  Nivea Body Extra Enriched Lotion  Nivea Body Original Lotion  Nivea Body Sheer Moisturizing Lotion Nivea Crme  Nivea Skin Firming Lotion  NutraDerm 30 Skin Lotion  NutraDerm Skin Lotion  NutraDerm Therapeutic Skin Cream  NutraDerm Therapeutic Skin Lotion  ProShield Protective Hand Cream  Provon moisturizing lotion   Incentive Spirometer  An incentive spirometer is a tool that can help keep your lungs clear and active. This tool measures how well you are filling your lungs with each breath. Taking long deep breaths may help reverse or decrease the chance of developing breathing (pulmonary) problems (especially infection) following: A long period of time when you are unable to move  or be active. BEFORE THE PROCEDURE  If the spirometer includes an indicator to show your best effort, your nurse or respiratory therapist will set it to a desired goal. If possible, sit up straight or lean slightly forward. Try not to slouch. Hold the incentive spirometer in an upright position. INSTRUCTIONS FOR USE  Sit on the edge of your bed if possible, or sit up as far as you can in bed or on a chair. Hold the incentive spirometer in an upright position. Breathe out normally. Place the mouthpiece in your mouth and seal your lips tightly around it. Breathe in slowly and as deeply as possible, raising the piston or the ball toward the top of the column. Hold your breath for 3-5 seconds or for as long as possible. Allow the piston or ball to fall to the bottom of the column. Remove the mouthpiece from your mouth and breathe out normally. Rest for a few seconds and repeat Steps 1 through 7 at least 10 times every 1-2 hours when you are awake. Take your time  and take a few normal breaths between deep breaths. The spirometer may include an indicator to show your best effort. Use the indicator as a goal to work toward during each repetition. After each set of 10 deep breaths, practice coughing to be sure your lungs are clear. If you have an incision (the cut made at the time of surgery), support your incision when coughing by placing a pillow or rolled up towels firmly against it. Once you are able to get out of bed, walk around indoors and cough well. You may stop using the incentive spirometer when instructed by your caregiver.  RISKS AND COMPLICATIONS Take your time so you do not get dizzy or light-headed. If you are in pain, you may need to take or ask for pain medication before doing incentive spirometry. It is harder to take a deep breath if you are having pain. AFTER USE Rest and breathe slowly and easily. It can be helpful to keep track of a log of your progress. Your caregiver can  provide you with a simple table to help with this. If you are using the spirometer at home, follow these instructions: SEEK MEDICAL CARE IF:  You are having difficultly using the spirometer. You have trouble using the spirometer as often as instructed. Your pain medication is not giving enough relief while using the spirometer. You develop fever of 100.5 F (38.1 C) or higher. SEEK IMMEDIATE MEDICAL CARE IF:  You cough up bloody sputum that had not been present before. You develop fever of 102 F (38.9 C) or greater. You develop worsening pain at or near the incision site. MAKE SURE YOU:  Understand these instructions. Will watch your condition. Will get help right away if you are not doing well or get worse. Document Released: 10/27/2006 Document Revised: 09/08/2011 Document Reviewed: 12/28/2006 [facility] Patient Information 2014 [place], [place].   ________________________________________________________________________

## 2023-06-11 ENCOUNTER — Encounter (HOSPITAL_COMMUNITY): Payer: Self-pay

## 2023-06-11 ENCOUNTER — Other Ambulatory Visit: Payer: Self-pay

## 2023-06-11 ENCOUNTER — Encounter (HOSPITAL_COMMUNITY)
Admission: RE | Admit: 2023-06-11 | Discharge: 2023-06-11 | Disposition: A | Discharge status: Home / Self Care | Payer: Medicare HMO | Source: Ambulatory Visit | Attending: Orthopedic Surgery | Admitting: Orthopedic Surgery

## 2023-06-11 VITALS — BP 166/81 | HR 75 | Temp 97.6°F | Ht 64.0 in | Wt 162.0 lb

## 2023-06-11 DIAGNOSIS — Z87891 Personal history of nicotine dependence: Secondary | ICD-10-CM | POA: Insufficient documentation

## 2023-06-11 DIAGNOSIS — I10 Essential (primary) hypertension: Secondary | ICD-10-CM | POA: Diagnosis not present

## 2023-06-11 DIAGNOSIS — Z01812 Encounter for preprocedural laboratory examination: Secondary | ICD-10-CM | POA: Insufficient documentation

## 2023-06-11 DIAGNOSIS — M1611 Unilateral primary osteoarthritis, right hip: Secondary | ICD-10-CM | POA: Diagnosis not present

## 2023-06-11 DIAGNOSIS — I25810 Atherosclerosis of coronary artery bypass graft(s) without angina pectoris: Secondary | ICD-10-CM | POA: Diagnosis not present

## 2023-06-11 DIAGNOSIS — Z01818 Encounter for other preprocedural examination: Secondary | ICD-10-CM

## 2023-06-11 HISTORY — DX: Unspecified osteoarthritis, unspecified site: M19.90

## 2023-06-11 LAB — CBC
HCT: 36.5 % — ABNORMAL LOW (ref 39.0–52.0)
Hemoglobin: 12.1 g/dL — ABNORMAL LOW (ref 13.0–17.0)
MCH: 32.3 pg (ref 26.0–34.0)
MCHC: 33.2 g/dL (ref 30.0–36.0)
MCV: 97.3 fL (ref 80.0–100.0)
Platelets: 143 10*3/uL — ABNORMAL LOW (ref 150–400)
RBC: 3.75 MIL/uL — ABNORMAL LOW (ref 4.22–5.81)
RDW: 13.8 % (ref 11.5–15.5)
WBC: 8.2 10*3/uL (ref 4.0–10.5)
nRBC: 0 % (ref 0.0–0.2)

## 2023-06-11 LAB — SURGICAL PCR SCREEN
MRSA, PCR: NEGATIVE
Staphylococcus aureus: NEGATIVE

## 2023-06-11 LAB — BASIC METABOLIC PANEL
Anion gap: 8 (ref 5–15)
BUN: 35 mg/dL — ABNORMAL HIGH (ref 8–23)
CO2: 26 mmol/L (ref 22–32)
Calcium: 9.3 mg/dL (ref 8.9–10.3)
Chloride: 107 mmol/L (ref 98–111)
Creatinine, Ser: 1.12 mg/dL (ref 0.61–1.24)
GFR, Estimated: >60 mL/min (ref >60)
Glucose, Bld: 103 mg/dL — ABNORMAL HIGH (ref 70–99)
Potassium: 4.2 mmol/L (ref 3.5–5.1)
Sodium: 141 mmol/L (ref 135–145)

## 2023-06-11 NOTE — Progress Notes (Signed)
For Anesthesia: PCP - Irven Coe, MD  Cardiologist - Lennette Bihari, MD  Clearance: Joni Reining: NP: 05/12/23 Bowel Prep reminder:  Chest x-ray -  EKG - 01/20/23 Stress Test -  ECHO - 02/12/23 Cardiac Cath -  Pacemaker/ICD device last checked: Pacemaker orders received: Device Rep notified:  Spinal Cord Stimulator:  Sleep Study - N/A CPAP -   Fasting Blood Sugar - N/A Checks Blood Sugar _____ times a day Date and result of last Hgb A1c-  Last dose of GLP1 agonist- N/A GLP1 instructions:   Last dose of SGLT-2 inhibitors- N/A SGLT-2 instructions:   Blood Thinner Instructions:N/A Aspirin Instructions: Last Dose:  Activity level: Can go up a flight of stairs and activities of daily living without stopping and without chest pain and/or shortness of breath   Able to exercise without chest pain and/or shortness of breath  Anesthesia review: Hx: HTN,CAD,MI  Patient denies shortness of breath, fever, cough and chest pain at PAT appointment   Patient verbalized understanding of instructions that were given to them at the PAT appointment. Patient was also instructed that they will need to review over the PAT instructions again at home before surgery.

## 2023-06-12 NOTE — Anesthesia Preprocedure Evaluation (Signed)
Anesthesia Evaluation  Patient identified by MRN, date of birth, ID band Patient awake    Reviewed: Allergy & Precautions, NPO status , Patient's Chart, lab work & pertinent test results  Airway Mallampati: II  TM Distance: >3 FB Neck ROM: Full    Dental no notable dental hx. (+) Teeth Intact, Dental Advisory Given   Pulmonary former smoker   Pulmonary exam normal breath sounds clear to auscultation       Cardiovascular hypertension, Pt. on medications and Pt. on home beta blockers + CAD, + Past MI (1998) and + Cardiac Stents (2001 Bare metal)  Normal cardiovascular exam+ Valvular Problems/Murmurs AS  Rhythm:Regular Rate:Normal  02/12/2023 TTE  1. Left ventricular ejection fraction, by estimation, is 50 to 55%. The  left ventricle has low normal function. The left ventricle has no regional  wall motion abnormalities. There is moderate left ventricular hypertrophy.  Left ventricular diastolic  parameters are consistent with Grade I diastolic dysfunction (impaired  relaxation). Elevated left atrial pressure.   2. Right ventricular systolic function is low normal. The right  ventricular size is normal. There is normal pulmonary artery systolic  pressure.   3. Left atrial size was mildly dilated.   4. MV is thickened Mildly restricted motion. Mean gradient through the  valve is 3 mm Hg (HR 62 bpm). Trivial mitral valve regurgitation. Severe  mitral annular calcification.   5. AV is thickened, calcified with restricted motion. May be functionally  bicuspid with fusion of R and L coronary cusps. Peak and mean gradients  through the valve are 25 and 14 mm Hg respectively AVA (VTI ) is 1.13 cm2  Dimensionless index is 0. 36  Overall consistent with moderate AS> . The aortic valve is abnormal.  Aortic valve regurgitation is not visualized.   6. The inferior vena cava is normal in size with greater than 50%  respiratory variability,  suggesting right atrial pressure of 3 mmHg.     Neuro/Psych    GI/Hepatic negative GI ROS,,,  Endo/Other    Renal/GU Lab Results      Component                Value               Date                      K                        4.2                 06/11/2023                      BUN                      35 (H)              06/11/2023                CREATININE               1.12                06/11/2023                     Musculoskeletal  (+) Arthritis , Osteoarthritis,    Abdominal   Peds  Hematology Lab Results  Component                Value               Date                      WBC                      8.2                 06/11/2023                HGB                      12.1 (L)            06/11/2023                HCT                      36.5 (L)            06/11/2023                MCV                      97.3                06/11/2023                PLT                      143 (L)             06/11/2023              Anesthesia Other Findings   Reproductive/Obstetrics                             Anesthesia Physical Anesthesia Plan  ASA: 3  Anesthesia Plan: Spinal   Post-op Pain Management: Ofirmev IV (intra-op)*, Regional block* and Minimal or no pain anticipated   Induction:   PONV Risk Score and Plan: 2 and Treatment may vary due to age or medical condition and Propofol infusion  Airway Management Planned: Mask and Natural Airway  Additional Equipment: None  Intra-op Plan:   Post-operative Plan:   Informed Consent: I have reviewed the patients History and Physical, chart, labs and discussed the procedure including the risks, benefits and alternatives for the proposed anesthesia with the patient or authorized representative who has indicated his/her understanding and acceptance.     Dental advisory given  Plan Discussed with: CRNA and Anesthesiologist  Anesthesia Plan Comments: (See PAT note  06/11/2023  Spinal )       Anesthesia Quick Evaluation

## 2023-06-12 NOTE — Progress Notes (Signed)
Anesthesia Chart Review   Case: [id number] Date/Time: 06/17/23 1055   Procedure: TOTAL HIP ARTHROPLASTY ANTERIOR APPROACH (Right: Hip)   Anesthesia type: Spinal   Pre-op diagnosis: right hip osteoarthritis   Location: [facility] ROOM 07 / [facility]   Surgeons: Samson Frederic, MD       DISCUSSION:89 y.o. former smoker with h/o HTN, CAD s/p CABG 1998, PCI with bare-metal stent 2001, moderate AS (mean gradient 14.50mmHg, valve area 1.13cm2 on 02/12/2023 Echo), right hip OA scheduled for above procedure 06/17/2023 with Dr. Samson Frederic.   Per cardiology preoperative evaluation 05/12/23, "According to the Revised Cardiac Risk Index (RCRI), his Perioperative Risk of Major Cardiac Event is (%): 11   His Functional Capacity in METs is: 8.91 according to the [facility] Activity Status Index (DASI).    The patient was advised that if he develops new symptoms prior to surgery to contact our office to arrange for a follow-up visit, and he verbalized understanding.   Therefore, based on ACC/AHA guidelines, patient would be at acceptable risk for the planned procedure without further cardiovascular testing. I will route this recommendation to the requesting party via Epic fax function. "   VS: BP (!) 166/81   Pulse 75   Temp 36.4 C (Oral)   Ht 5\' 4"  (1.626 m)   Wt 73.5 kg   SpO2 99%   BMI 27.81 kg/m   PROVIDERS: Irven Coe, MD is PCP   Cardiologist - Lennette Bihari, MD  LABS: Labs reviewed: Acceptable for surgery. (all labs ordered are listed, but only abnormal results are displayed)  Labs Reviewed  CBC - Abnormal; Notable for the following components:      Result Value   RBC 3.75 (*)    Hemoglobin 12.1 (*)    HCT 36.5 (*)    Platelets 143 (*)    All other components within normal limits  BASIC METABOLIC PANEL - Abnormal; Notable for the following components:   Glucose, Bld 103 (*)    BUN 35 (*)    All other components within normal limits  SURGICAL PCR SCREEN  TYPE AND SCREEN      IMAGES:   EKG:   CV: Echo 02/12/2023 1. Left ventricular ejection fraction, by estimation, is 50 to 55%. The  left ventricle has low normal function. The left ventricle has no regional  wall motion abnormalities. There is moderate left ventricular hypertrophy.  Left ventricular diastolic  parameters are consistent with Grade I diastolic dysfunction (impaired  relaxation). Elevated left atrial pressure.   2. Right ventricular systolic function is low normal. The right  ventricular size is normal. There is normal pulmonary artery systolic  pressure.   3. Left atrial size was mildly dilated.   4. MV is thickened Mildly restricted motion. Mean gradient through the  valve is 3 mm Hg (HR 62 bpm). Trivial mitral valve regurgitation. Severe  mitral annular calcification.   5. AV is thickened, calcified with restricted motion. May be functionally  bicuspid with fusion of R and L coronary cusps. Peak and mean gradients  through the valve are 25 and 14 mm Hg respectively AVA (VTI ) is 1.13 cm2  Dimensionless index is 0. 36  Overall consistent with moderate AS> . The aortic valve is abnormal.  Aortic valve regurgitation is not visualized.   6. The inferior vena cava is normal in size with greater than 50%  respiratory variability, suggesting right atrial pressure of 3 mmHg.   Myocardial Perfusion 02/01/2020 Nuclear stress EF: 46%. The left  ventricular ejection fraction is mildly decreased (45-54%). There was no ST segment deviation noted during stress. Findings consistent with prior myocardial infarction. This is an intermediate risk study.   Mild hypokinesis of anterior wall. EF 46%.    There is a medium size, fixed anterior wall perfusion defect, consistent with infarct. This appears new on limited comparison to prior exam. No ischemia noted on perfusion images or stress ECG. Past Medical History:  Diagnosis Date   Arthritis    CAD (coronary artery disease) of artery bypass  graft 04/18/2013   CABG x 6 1998; PCI to native LCx 2001    CAD in native artery 02/05/2016   Coronary artery disease    followed by Dr. Tresa Endo - last office visit 06/08/13   Essential hypertension 04/18/2013   History of kidney stones    Hyperlipidemia with target LDL less than 70 04/18/2013   Hypertension    MI (myocardial infarction) (HCC) 1998    x2 MI's    Past Surgical History:  Procedure Laterality Date   BYPASS GRAFT  06/30/1996   CORONARY ARTERY BYPASS GRAFT  06/30/1996   6 vessels   CYSTOSCOPY WITH RETROGRADE PYELOGRAM, URETEROSCOPY AND STENT PLACEMENT Right 05/11/2013   Procedure: CYSTOSCOPY WITH RIGHT RETROGRADE PYELOGRAM,CYSTOLITHOPAXY/DIAGNOSTIC  DIGITAL FLEXIBLE RIGHT URETEROSCOPY WITH STENT PLACEMENT;  Surgeon: Sebastian Ache, MD;  Location: [facility];  Service: Urology;  Laterality: Right;   CYSTOSCOPY WITH RETROGRADE PYELOGRAM, URETEROSCOPY AND STENT PLACEMENT Right 06/10/2013   Procedure: CYSTOSCOPY WITH RETROGRADE PYELOGRAM, URETEROSCOPY AND STENT PLACEMENT;  Surgeon: Sebastian Ache, MD;  Location: [facility];  Service: Urology;  Laterality: Right;   INNER EAR SURGERY     JOINT REPLACEMENT     Left hip   KIDNEY STONE SURGERY  06/30/1964   stented coronary artery  06/30/1997   TONSILLECTOMY      MEDICATIONS:  acetaminophen (TYLENOL) 500 MG tablet   benazepril (LOTENSIN) 40 MG tablet   Calcium Carbonate Antacid (TUMS PO)   Coenzyme Q10 (COQ10) 100 MG CAPS   fish oil-omega-3 fatty acids 1000 MG capsule   Flaxseed, Linseed, (FLAX SEEDS PO)   folic acid (FOLVITE) 400 MCG tablet   Glucosamine-Chondroitin (OSTEO BI-FLEX REGULAR STRENGTH PO)   Lecithin 1200 MG CAPS   metoprolol tartrate (LOPRESSOR) 25 MG tablet   Multiple Vitamins-Minerals (MULTIVITAMIN WITH MINERALS) tablet   Multiple Vitamins-Minerals (PRESERVISION AREDS 2) CAPS   Polyethyl Glycol-Propyl Glycol (SYSTANE ULTRA) 0.4-0.3 % SOLN   pyridOXINE (B-6) 50 MG tablet   terazosin (HYTRIN) 5 MG capsule    vitamin B-12 (CYANOCOBALAMIN) 500 MCG tablet   vitamin C (ASCORBIC ACID) 500 MG tablet   No current facility-administered medications for this encounter.    Jodell Cipro Ward, PA-C [facility] [phone]

## 2023-06-16 ENCOUNTER — Ambulatory Visit: Payer: Self-pay | Admitting: Student

## 2023-06-16 NOTE — H&P (Signed)
TOTAL HIP ADMISSION H&P  Patient is admitted for right total hip arthroplasty.  Subjective:  Chief Complaint: right hip pain  HPI: Jared Dunlap, 89 y.o. male, has a history of pain and functional disability in the right hip(s) due to arthritis and patient has failed non-surgical conservative treatments for greater than 12 weeks to include NSAID's and/or analgesics, corticosteriod injections, flexibility and strengthening excercises, use of assistive devices, and activity modification.  Onset of symptoms was gradual starting 10 years ago with rapidlly worsening course since that time.The patient noted no past surgery on the right hip(s).  Patient currently rates pain in the right hip at 10 out of 10 with activity. Patient has night pain, worsening of pain with activity and weight bearing, trendelenberg gait, pain that interfers with activities of daily living, and pain with passive range of motion. Patient has evidence of subchondral cysts, subchondral sclerosis, periarticular osteophytes, and joint space narrowing by imaging studies. This condition presents safety issues increasing the risk of falls.  There is no current active infection.  Patient Active Problem List   Diagnosis Date Noted   CAD in native artery 02/05/2016   CAD (coronary artery disease) of artery bypass graft 04/18/2013   Hyperlipidemia with target LDL less than 70 04/18/2013   Essential hypertension 04/18/2013   Past Medical History:  Diagnosis Date   Arthritis    CAD (coronary artery disease) of artery bypass graft 04/18/2013   CABG x 6 1998; PCI to native LCx 2001    CAD in native artery 02/05/2016   Coronary artery disease    followed by Dr. Tresa Endo - last office visit 06/08/13   Essential hypertension 04/18/2013   History of kidney stones    Hyperlipidemia with target LDL less than 70 04/18/2013   Hypertension    MI (myocardial infarction) (HCC) 1998    x2 MI's    Past Surgical History:  Procedure Laterality  Date   BYPASS GRAFT  06/30/1996   CORONARY ARTERY BYPASS GRAFT  06/30/1996   6 vessels   CYSTOSCOPY WITH RETROGRADE PYELOGRAM, URETEROSCOPY AND STENT PLACEMENT Right 05/11/2013   Procedure: CYSTOSCOPY WITH RIGHT RETROGRADE PYELOGRAM,CYSTOLITHOPAXY/DIAGNOSTIC  DIGITAL FLEXIBLE RIGHT URETEROSCOPY WITH STENT PLACEMENT;  Surgeon: Sebastian Ache, MD;  Location: [facility];  Service: Urology;  Laterality: Right;   CYSTOSCOPY WITH RETROGRADE PYELOGRAM, URETEROSCOPY AND STENT PLACEMENT Right 06/10/2013   Procedure: CYSTOSCOPY WITH RETROGRADE PYELOGRAM, URETEROSCOPY AND STENT PLACEMENT;  Surgeon: Sebastian Ache, MD;  Location: [facility];  Service: Urology;  Laterality: Right;   INNER EAR SURGERY     JOINT REPLACEMENT     Left hip   KIDNEY STONE SURGERY  06/30/1964   stented coronary artery  06/30/1997   TONSILLECTOMY      Current Outpatient Medications  Medication Sig Dispense Refill Last Dose/Taking   acetaminophen (TYLENOL) 500 MG tablet Take 2 tablets (1,000 mg total) by mouth every 6 (six) hours as needed. (Patient taking differently: Take 500 mg by mouth every 8 (eight) hours as needed for mild pain (pain score 1-3) or moderate pain (pain score 4-6).) 30 tablet 0    benazepril (LOTENSIN) 40 MG tablet TAKE 1 TABLET EVERY DAY (Patient taking differently: Take 40 mg by mouth daily.) 90 tablet 3    Calcium Carbonate Antacid (TUMS PO) Take 1 tablet by mouth daily as needed (Heartburn).      Coenzyme Q10 (COQ10) 100 MG CAPS Take 100 mg by mouth daily.      fish oil-omega-3 fatty acids 1000 MG capsule Take  2 g by mouth daily.      Flaxseed, Linseed, (FLAX SEEDS PO) Take 5 mLs by mouth daily.      folic acid (FOLVITE) 400 MCG tablet Take 1 tablet (400 mcg total) by mouth daily. 90 tablet 3    Glucosamine-Chondroitin (OSTEO BI-FLEX REGULAR STRENGTH PO) Take 1 tablet by mouth daily. Triple      Lecithin 1200 MG CAPS Take 1,200 mg by mouth daily.      metoprolol tartrate (LOPRESSOR) 25 MG tablet TAKE 1/2 TABLET  TWICE DAILY (Patient taking differently: Take 12.5 mg by mouth 2 (two) times daily.) 90 tablet 3    Multiple Vitamins-Minerals (MULTIVITAMIN WITH MINERALS) tablet Take 1 tablet by mouth daily. Men +      Multiple Vitamins-Minerals (PRESERVISION AREDS 2) CAPS Take 1 capsule by mouth 2 (two) times daily.      Polyethyl Glycol-Propyl Glycol (SYSTANE ULTRA) 0.4-0.3 % SOLN Place 1 drop into both eyes 2 (two) times daily.      pyridOXINE (B-6) 50 MG tablet Take 50 mg by mouth daily.      terazosin (HYTRIN) 5 MG capsule Take 5 mg by mouth at bedtime.      vitamin B-12 (CYANOCOBALAMIN) 500 MCG tablet Take 500 mcg by mouth daily.      vitamin C (ASCORBIC ACID) 500 MG tablet Take 500 mg by mouth 2 (two) times daily.      No current facility-administered medications for this visit.   Allergies  Allergen Reactions   Atorvastatin Calcium Other (See Comments)    Caused forgetfulness,and made the joints hurt   Ezetimibe Other (See Comments)    Caused forgetfulness,and made the joints hurt   Ezetimibe-Simvastatin Other (See Comments)    Caused forgetfulness,and made the joints hurt   Fluvastatin Other (See Comments)    Name brand is Lescol Caused forgetfulness,and made the joints hurt   Pravastatin Sodium Other (See Comments)    Caused forgetfulness,and made the joints hurt   Rosuvastatin Other (See Comments)    Caused forgetfulness,and made the joints hurt   Simvastatin Other (See Comments)    Caused forgetfulness,and made the joints hurt   Welchol [Colesevelam Hcl] Other (See Comments)   Yellow Jacket Venom Other (See Comments)    Extreme pain, hands and ankles became swollen, oozes at site stung    Social History   Tobacco Use   Smoking status: Former    Current packs/day: 0.00    Types: Cigarettes    Quit date: 04/19/1963    Years since quitting: 60.2   Smokeless tobacco: Never  Substance Use Topics   Alcohol use: No    Family History  Problem Relation Age of Onset   Heart disease  Mother    Heart disease Father    Heart disease Brother    Cancer Brother    Heart disease Brother    Kidney Stones Brother      Review of Systems  Musculoskeletal:  Positive for arthralgias and gait problem.  All other systems reviewed and are negative.   Objective:  Physical Exam Constitutional:      Appearance: Normal appearance.  HENT:     Head: Normocephalic and atraumatic.     Nose: Nose normal.     Mouth/Throat:     Mouth: Mucous membranes are moist.     Pharynx: Oropharynx is clear.  Eyes:     Conjunctiva/sclera: Conjunctivae normal.  Cardiovascular:     Rate and Rhythm: Normal rate and regular rhythm.  Pulses: Normal pulses.     Heart sounds: Normal heart sounds.  Pulmonary:     Effort: Pulmonary effort is normal.     Breath sounds: Normal breath sounds.  Abdominal:     General: Abdomen is flat.     Palpations: Abdomen is soft.  Genitourinary:    Comments: deferred Musculoskeletal:     Cervical back: Normal range of motion and neck supple.     Comments: Examination of the right hip reveals no skin wounds or lesions. Mild trochanteric tenderness to palpation. He has severely restricted range of motion of the right hip. Pain with terminal flexion and rotation. Pain in the position of impingement. Positive Stinchfield.  Distally, there is no focal motor or sensory deficit. He has palpable pedal pulses.  Ambulates with an antalgic gait.  Skin:    General: Skin is warm and dry.     Capillary Refill: Capillary refill takes less than 2 seconds.  Neurological:     General: No focal deficit present.     Mental Status: He is alert and oriented to person, place, and time.  Psychiatric:        Mood and Affect: Mood normal.        Behavior: Behavior normal.        Thought Content: Thought content normal.        Judgment: Judgment normal.     Vital signs in last 24 hours: @VSRANGES @  Labs:   Estimated body mass index is 27.81 kg/m as calculated from the  following:   Height as of 06/11/23: 5\' 4"  (1.626 m).   Weight as of 06/11/23: 73.5 kg.   Imaging Review Plain radiographs demonstrate severe degenerative joint disease of the right hip(s). The bone quality appears to be adequate for age and reported activity level.      Assessment/Plan:  End stage arthritis, right hip(s)  The patient history, physical examination, clinical judgement of the provider and imaging studies are consistent with end stage degenerative joint disease of the right hip(s) and total hip arthroplasty is deemed medically necessary. The treatment options including medical management, injection therapy, arthroscopy and arthroplasty were discussed at length. The risks and benefits of total hip arthroplasty were presented and reviewed. The risks due to aseptic loosening, infection, stiffness, dislocation/subluxation,  thromboembolic complications and other imponderables were discussed.  The patient acknowledged the explanation, agreed to proceed with the plan and consent was signed. Patient is being admitted for inpatient treatment for surgery, pain control, PT, OT, prophylactic antibiotics, VTE prophylaxis, progressive ambulation and ADL's and discharge planning.The patient is planning to be discharged home with HEP after an overnight stay.   Therapy Plans: HEP.  Disposition: Home with wife Planned DVT Prophylaxis: Eliquis 2.5mg  BID DME needed: walker PCP: Cleared.  Cardiology: Cleared.  TXA: IV Allergies:  - Statins - muscle pain, forgetfulness.  - NSAIDs, aspirin - GI bleed.  Anesthesia Concerns: None.  BMI: 28.2 Last HgbA1c: Not diabetic.  Other: - CAD, s/p CABG stent placement.  - History GI bleed, no aspirin or NSAIDs.  - Hydrocodone, zofran.  - 06/11/23: K+ 4.2, Cr. 1.12, 12.1.     Patient's anticipated LOS is less than 2 midnights, meeting these requirements: - Younger than 45 - Lives within 1 hour of care - Has a competent adult at home to recover  with post-op recover - NO history of  - Chronic pain requiring opiods  - Diabetes  - Coronary Artery Disease  - Heart failure  - Heart attack  -  Stroke  - DVT/VTE  - Cardiac arrhythmia  - Respiratory Failure/COPD  - Renal failure  - Anemia  - Advanced Liver disease

## 2023-06-16 NOTE — H&P (View-Only) (Signed)
TOTAL HIP ADMISSION H&P  Patient is admitted for right total hip arthroplasty.  Subjective:  Chief Complaint: right hip pain  HPI: Jared Dunlap, 89 y.o. male, has a history of pain and functional disability in the right hip(s) due to arthritis and patient has failed non-surgical conservative treatments for greater than 12 weeks to include NSAID's and/or analgesics, corticosteriod injections, flexibility and strengthening excercises, use of assistive devices, and activity modification.  Onset of symptoms was gradual starting 10 years ago with rapidlly worsening course since that time.The patient noted no past surgery on the right hip(s).  Patient currently rates pain in the right hip at 10 out of 10 with activity. Patient has night pain, worsening of pain with activity and weight bearing, trendelenberg gait, pain that interfers with activities of daily living, and pain with passive range of motion. Patient has evidence of subchondral cysts, subchondral sclerosis, periarticular osteophytes, and joint space narrowing by imaging studies. This condition presents safety issues increasing the risk of falls.  There is no current active infection.  Patient Active Problem List   Diagnosis Date Noted   CAD in native artery 02/05/2016   CAD (coronary artery disease) of artery bypass graft 04/18/2013   Hyperlipidemia with target LDL less than 70 04/18/2013   Essential hypertension 04/18/2013   Past Medical History:  Diagnosis Date   Arthritis    CAD (coronary artery disease) of artery bypass graft 04/18/2013   CABG x 6 1998; PCI to native LCx 2001    CAD in native artery 02/05/2016   Coronary artery disease    followed by Dr. Tresa Endo - last office visit 06/08/13   Essential hypertension 04/18/2013   History of kidney stones    Hyperlipidemia with target LDL less than 70 04/18/2013   Hypertension    MI (myocardial infarction) (HCC) 1998    x2 MI's    Past Surgical History:  Procedure Laterality  Date   BYPASS GRAFT  06/30/1996   CORONARY ARTERY BYPASS GRAFT  06/30/1996   6 vessels   CYSTOSCOPY WITH RETROGRADE PYELOGRAM, URETEROSCOPY AND STENT PLACEMENT Right 05/11/2013   Procedure: CYSTOSCOPY WITH RIGHT RETROGRADE PYELOGRAM,CYSTOLITHOPAXY/DIAGNOSTIC  DIGITAL FLEXIBLE RIGHT URETEROSCOPY WITH STENT PLACEMENT;  Surgeon: Sebastian Ache, MD;  Location: [facility];  Service: Urology;  Laterality: Right;   CYSTOSCOPY WITH RETROGRADE PYELOGRAM, URETEROSCOPY AND STENT PLACEMENT Right 06/10/2013   Procedure: CYSTOSCOPY WITH RETROGRADE PYELOGRAM, URETEROSCOPY AND STENT PLACEMENT;  Surgeon: Sebastian Ache, MD;  Location: [facility];  Service: Urology;  Laterality: Right;   INNER EAR SURGERY     JOINT REPLACEMENT     Left hip   KIDNEY STONE SURGERY  06/30/1964   stented coronary artery  06/30/1997   TONSILLECTOMY      Current Outpatient Medications  Medication Sig Dispense Refill Last Dose/Taking   acetaminophen (TYLENOL) 500 MG tablet Take 2 tablets (1,000 mg total) by mouth every 6 (six) hours as needed. (Patient taking differently: Take 500 mg by mouth every 8 (eight) hours as needed for mild pain (pain score 1-3) or moderate pain (pain score 4-6).) 30 tablet 0    benazepril (LOTENSIN) 40 MG tablet TAKE 1 TABLET EVERY DAY (Patient taking differently: Take 40 mg by mouth daily.) 90 tablet 3    Calcium Carbonate Antacid (TUMS PO) Take 1 tablet by mouth daily as needed (Heartburn).      Coenzyme Q10 (COQ10) 100 MG CAPS Take 100 mg by mouth daily.      fish oil-omega-3 fatty acids 1000 MG capsule Take  2 g by mouth daily.      Flaxseed, Linseed, (FLAX SEEDS PO) Take 5 mLs by mouth daily.      folic acid (FOLVITE) 400 MCG tablet Take 1 tablet (400 mcg total) by mouth daily. 90 tablet 3    Glucosamine-Chondroitin (OSTEO BI-FLEX REGULAR STRENGTH PO) Take 1 tablet by mouth daily. Triple      Lecithin 1200 MG CAPS Take 1,200 mg by mouth daily.      metoprolol tartrate (LOPRESSOR) 25 MG tablet TAKE 1/2 TABLET  TWICE DAILY (Patient taking differently: Take 12.5 mg by mouth 2 (two) times daily.) 90 tablet 3    Multiple Vitamins-Minerals (MULTIVITAMIN WITH MINERALS) tablet Take 1 tablet by mouth daily. Men +      Multiple Vitamins-Minerals (PRESERVISION AREDS 2) CAPS Take 1 capsule by mouth 2 (two) times daily.      Polyethyl Glycol-Propyl Glycol (SYSTANE ULTRA) 0.4-0.3 % SOLN Place 1 drop into both eyes 2 (two) times daily.      pyridOXINE (B-6) 50 MG tablet Take 50 mg by mouth daily.      terazosin (HYTRIN) 5 MG capsule Take 5 mg by mouth at bedtime.      vitamin B-12 (CYANOCOBALAMIN) 500 MCG tablet Take 500 mcg by mouth daily.      vitamin C (ASCORBIC ACID) 500 MG tablet Take 500 mg by mouth 2 (two) times daily.      No current facility-administered medications for this visit.   Allergies  Allergen Reactions   Atorvastatin Calcium Other (See Comments)    Caused forgetfulness,and made the joints hurt   Ezetimibe Other (See Comments)    Caused forgetfulness,and made the joints hurt   Ezetimibe-Simvastatin Other (See Comments)    Caused forgetfulness,and made the joints hurt   Fluvastatin Other (See Comments)    Name brand is Lescol Caused forgetfulness,and made the joints hurt   Pravastatin Sodium Other (See Comments)    Caused forgetfulness,and made the joints hurt   Rosuvastatin Other (See Comments)    Caused forgetfulness,and made the joints hurt   Simvastatin Other (See Comments)    Caused forgetfulness,and made the joints hurt   Welchol [Colesevelam Hcl] Other (See Comments)   Yellow Jacket Venom Other (See Comments)    Extreme pain, hands and ankles became swollen, oozes at site stung    Social History   Tobacco Use   Smoking status: Former    Current packs/day: 0.00    Types: Cigarettes    Quit date: 04/19/1963    Years since quitting: 60.2   Smokeless tobacco: Never  Substance Use Topics   Alcohol use: No    Family History  Problem Relation Age of Onset   Heart disease  Mother    Heart disease Father    Heart disease Brother    Cancer Brother    Heart disease Brother    Kidney Stones Brother      Review of Systems  Musculoskeletal:  Positive for arthralgias and gait problem.  All other systems reviewed and are negative.   Objective:  Physical Exam Constitutional:      Appearance: Normal appearance.  HENT:     Head: Normocephalic and atraumatic.     Nose: Nose normal.     Mouth/Throat:     Mouth: Mucous membranes are moist.     Pharynx: Oropharynx is clear.  Eyes:     Conjunctiva/sclera: Conjunctivae normal.  Cardiovascular:     Rate and Rhythm: Normal rate and regular rhythm.  Pulses: Normal pulses.     Heart sounds: Normal heart sounds.  Pulmonary:     Effort: Pulmonary effort is normal.     Breath sounds: Normal breath sounds.  Abdominal:     General: Abdomen is flat.     Palpations: Abdomen is soft.  Genitourinary:    Comments: deferred Musculoskeletal:     Cervical back: Normal range of motion and neck supple.     Comments: Examination of the right hip reveals no skin wounds or lesions. Mild trochanteric tenderness to palpation. He has severely restricted range of motion of the right hip. Pain with terminal flexion and rotation. Pain in the position of impingement. Positive Stinchfield.  Distally, there is no focal motor or sensory deficit. He has palpable pedal pulses.  Ambulates with an antalgic gait.  Skin:    General: Skin is warm and dry.     Capillary Refill: Capillary refill takes less than 2 seconds.  Neurological:     General: No focal deficit present.     Mental Status: He is alert and oriented to person, place, and time.  Psychiatric:        Mood and Affect: Mood normal.        Behavior: Behavior normal.        Thought Content: Thought content normal.        Judgment: Judgment normal.     Vital signs in last 24 hours: @VSRANGES @  Labs:   Estimated body mass index is 27.81 kg/m as calculated from the  following:   Height as of 06/11/23: 5\' 4"  (1.626 m).   Weight as of 06/11/23: 73.5 kg.   Imaging Review Plain radiographs demonstrate severe degenerative joint disease of the right hip(s). The bone quality appears to be adequate for age and reported activity level.      Assessment/Plan:  End stage arthritis, right hip(s)  The patient history, physical examination, clinical judgement of the provider and imaging studies are consistent with end stage degenerative joint disease of the right hip(s) and total hip arthroplasty is deemed medically necessary. The treatment options including medical management, injection therapy, arthroscopy and arthroplasty were discussed at length. The risks and benefits of total hip arthroplasty were presented and reviewed. The risks due to aseptic loosening, infection, stiffness, dislocation/subluxation,  thromboembolic complications and other imponderables were discussed.  The patient acknowledged the explanation, agreed to proceed with the plan and consent was signed. Patient is being admitted for inpatient treatment for surgery, pain control, PT, OT, prophylactic antibiotics, VTE prophylaxis, progressive ambulation and ADL's and discharge planning.The patient is planning to be discharged home with HEP after an overnight stay.   Therapy Plans: HEP.  Disposition: Home with wife Planned DVT Prophylaxis: Eliquis 2.5mg  BID DME needed: walker PCP: Cleared.  Cardiology: Cleared.  TXA: IV Allergies:  - Statins - muscle pain, forgetfulness.  - NSAIDs, aspirin - GI bleed.  Anesthesia Concerns: None.  BMI: 28.2 Last HgbA1c: Not diabetic.  Other: - CAD, s/p CABG stent placement.  - History GI bleed, no aspirin or NSAIDs.  - Hydrocodone, zofran.  - 06/11/23: K+ 4.2, Cr. 1.12, 12.1.     Patient's anticipated LOS is less than 2 midnights, meeting these requirements: - Younger than 45 - Lives within 1 hour of care - Has a competent adult at home to recover  with post-op recover - NO history of  - Chronic pain requiring opiods  - Diabetes  - Coronary Artery Disease  - Heart failure  - Heart attack  -  Stroke  - DVT/VTE  - Cardiac arrhythmia  - Respiratory Failure/COPD  - Renal failure  - Anemia  - Advanced Liver disease

## 2023-06-17 ENCOUNTER — Ambulatory Visit (HOSPITAL_BASED_OUTPATIENT_CLINIC_OR_DEPARTMENT_OTHER): Payer: Medicare HMO | Admitting: Anesthesiology

## 2023-06-17 ENCOUNTER — Ambulatory Visit (HOSPITAL_COMMUNITY): Payer: Medicare HMO

## 2023-06-17 ENCOUNTER — Ambulatory Visit (HOSPITAL_COMMUNITY)
Admission: RE | Admit: 2023-06-17 | Discharge: 2023-06-18 | Disposition: A | Discharge status: Home / Self Care | Payer: Medicare HMO | Source: Ambulatory Visit | Attending: Orthopedic Surgery | Admitting: Orthopedic Surgery

## 2023-06-17 ENCOUNTER — Encounter (HOSPITAL_COMMUNITY): Payer: Self-pay | Admitting: Orthopedic Surgery

## 2023-06-17 ENCOUNTER — Other Ambulatory Visit: Payer: Self-pay

## 2023-06-17 ENCOUNTER — Ambulatory Visit (HOSPITAL_COMMUNITY): Payer: Self-pay | Admitting: Physician Assistant

## 2023-06-17 ENCOUNTER — Encounter (HOSPITAL_COMMUNITY)
Admission: RE | Disposition: A | Discharge status: Home / Self Care | Payer: Self-pay | Source: Ambulatory Visit | Attending: Orthopedic Surgery

## 2023-06-17 DIAGNOSIS — M1611 Unilateral primary osteoarthritis, right hip: Secondary | ICD-10-CM

## 2023-06-17 DIAGNOSIS — I2510 Atherosclerotic heart disease of native coronary artery without angina pectoris: Secondary | ICD-10-CM | POA: Insufficient documentation

## 2023-06-17 DIAGNOSIS — Z96641 Presence of right artificial hip joint: Secondary | ICD-10-CM | POA: Diagnosis present

## 2023-06-17 DIAGNOSIS — I3481 Nonrheumatic mitral (valve) annulus calcification: Secondary | ICD-10-CM | POA: Diagnosis not present

## 2023-06-17 DIAGNOSIS — I10 Essential (primary) hypertension: Secondary | ICD-10-CM | POA: Insufficient documentation

## 2023-06-17 DIAGNOSIS — Z87891 Personal history of nicotine dependence: Secondary | ICD-10-CM | POA: Insufficient documentation

## 2023-06-17 DIAGNOSIS — Z96643 Presence of artificial hip joint, bilateral: Secondary | ICD-10-CM | POA: Diagnosis not present

## 2023-06-17 DIAGNOSIS — E785 Hyperlipidemia, unspecified: Secondary | ICD-10-CM | POA: Diagnosis not present

## 2023-06-17 DIAGNOSIS — Z471 Aftercare following joint replacement surgery: Secondary | ICD-10-CM | POA: Diagnosis not present

## 2023-06-17 DIAGNOSIS — Z955 Presence of coronary angioplasty implant and graft: Secondary | ICD-10-CM | POA: Diagnosis not present

## 2023-06-17 DIAGNOSIS — M1612 Unilateral primary osteoarthritis, left hip: Secondary | ICD-10-CM | POA: Diagnosis not present

## 2023-06-17 DIAGNOSIS — I252 Old myocardial infarction: Secondary | ICD-10-CM | POA: Diagnosis not present

## 2023-06-17 DIAGNOSIS — Z79899 Other long term (current) drug therapy: Secondary | ICD-10-CM | POA: Diagnosis not present

## 2023-06-17 HISTORY — PX: TOTAL HIP ARTHROPLASTY: SHX124

## 2023-06-17 LAB — TYPE AND SCREEN
ABO/RH(D): A POS
Antibody Screen: NEGATIVE

## 2023-06-17 LAB — ABO/RH: ABO/RH(D): A POS

## 2023-06-17 SURGERY — ARTHROPLASTY, HIP, TOTAL, ANTERIOR APPROACH
Anesthesia: Spinal | Site: Hip | Laterality: Right

## 2023-06-17 MED ORDER — ALBUMIN HUMAN 5 % IV SOLN
INTRAVENOUS | Status: AC
  Filled 2023-06-17: qty 250

## 2023-06-17 MED ORDER — BUPIVACAINE-EPINEPHRINE 0.25% -1:200000 IJ SOLN
INTRAMUSCULAR | Status: AC
  Filled 2023-06-17: qty 1

## 2023-06-17 MED ORDER — ACETAMINOPHEN 10 MG/ML IV SOLN: 1000.0000 mg | Freq: Once | INTRAVENOUS | Status: DC | PRN

## 2023-06-17 MED ORDER — SODIUM CHLORIDE 0.9 % IR SOLN
Status: DC | PRN
  Administered 2023-06-17: 1000 mL
  Administered 2023-06-17: 500 mL

## 2023-06-17 MED ORDER — LIDOCAINE HCL (PF) 2 % IJ SOLN
INTRAMUSCULAR | Status: AC
  Filled 2023-06-17: qty 5

## 2023-06-17 MED ORDER — CEFAZOLIN SODIUM-DEXTROSE 2-4 GM/100ML-% IV SOLN
2.0000 g | INTRAVENOUS | Status: AC
Start: 2023-06-17 — End: 2023-06-17
  Administered 2023-06-17: 2 g via INTRAVENOUS
  Filled 2023-06-17: qty 100

## 2023-06-17 MED ORDER — DEXAMETHASONE SODIUM PHOSPHATE 10 MG/ML IJ SOLN
INTRAMUSCULAR | Status: DC | PRN
  Administered 2023-06-17: 4 mg via INTRAVENOUS

## 2023-06-17 MED ORDER — METHOCARBAMOL 500 MG PO TABS
500.0000 mg | ORAL_TABLET | Freq: Four times a day (QID) | ORAL | Status: DC | PRN
  Administered 2023-06-17 – 2023-06-18 (×3): 500 mg via ORAL
  Filled 2023-06-17 (×4): qty 1

## 2023-06-17 MED ORDER — LACTATED RINGERS IV SOLN: INTRAVENOUS | Status: DC

## 2023-06-17 MED ORDER — 0.9 % SODIUM CHLORIDE (POUR BTL) OPTIME
TOPICAL | Status: DC | PRN
  Administered 2023-06-17: 1000 mL

## 2023-06-17 MED ORDER — CALCIUM CHLORIDE 10 % IV SOLN
INTRAVENOUS | Status: DC | PRN
  Administered 2023-06-17 (×2): .5 g via INTRAVENOUS

## 2023-06-17 MED ORDER — PROPOFOL 1000 MG/100ML IV EMUL
INTRAVENOUS | Status: AC
  Filled 2023-06-17: qty 100

## 2023-06-17 MED ORDER — APIXABAN 2.5 MG PO TABS
2.5000 mg | ORAL_TABLET | Freq: Two times a day (BID) | ORAL | Status: DC
  Administered 2023-06-18: 2.5 mg via ORAL
  Filled 2023-06-17: qty 1

## 2023-06-17 MED ORDER — WATER FOR IRRIGATION, STERILE IR SOLN
Status: DC | PRN
  Administered 2023-06-17: 1000 mL

## 2023-06-17 MED ORDER — HYDROCODONE-ACETAMINOPHEN 5-325 MG PO TABS
1.0000 | ORAL_TABLET | ORAL | Status: DC | PRN
  Administered 2023-06-17 – 2023-06-18 (×3): 1 via ORAL
  Filled 2023-06-17 (×3): qty 1

## 2023-06-17 MED ORDER — DIPHENHYDRAMINE HCL 12.5 MG/5ML PO ELIX: 12.5000 mg | ORAL_SOLUTION | ORAL | Status: DC | PRN

## 2023-06-17 MED ORDER — METOCLOPRAMIDE HCL 5 MG/ML IJ SOLN
5.0000 mg | Freq: Three times a day (TID) | INTRAMUSCULAR | Status: DC | PRN
Start: 2023-06-17 — End: 2023-06-18

## 2023-06-17 MED ORDER — TRANEXAMIC ACID-NACL 1000-0.7 MG/100ML-% IV SOLN
1000.0000 mg | INTRAVENOUS | Status: AC
  Administered 2023-06-17: 1000 mg via INTRAVENOUS
  Filled 2023-06-17: qty 100

## 2023-06-17 MED ORDER — CEFAZOLIN SODIUM-DEXTROSE 2-4 GM/100ML-% IV SOLN
2.0000 g | Freq: Four times a day (QID) | INTRAVENOUS | Status: AC
  Administered 2023-06-17 – 2023-06-18 (×2): 2 g via INTRAVENOUS
  Filled 2023-06-17 (×2): qty 100

## 2023-06-17 MED ORDER — FENTANYL CITRATE (PF) 100 MCG/2ML IJ SOLN
INTRAMUSCULAR | Status: AC
  Filled 2023-06-17: qty 2

## 2023-06-17 MED ORDER — ONDANSETRON HCL 4 MG/2ML IJ SOLN
INTRAMUSCULAR | Status: DC | PRN
  Administered 2023-06-17: 4 mg via INTRAVENOUS

## 2023-06-17 MED ORDER — ORAL CARE MOUTH RINSE: 15.0000 mL | Freq: Once | OROMUCOSAL | Status: AC

## 2023-06-17 MED ORDER — KETOROLAC TROMETHAMINE 30 MG/ML IJ SOLN
INTRAMUSCULAR | Status: DC | PRN
  Administered 2023-06-17: 30 mg

## 2023-06-17 MED ORDER — PHENOL 1.4 % MT LIQD: 1.0000 | OROMUCOSAL | Status: DC | PRN

## 2023-06-17 MED ORDER — MENTHOL 3 MG MT LOZG: 1.0000 | LOZENGE | OROMUCOSAL | Status: DC | PRN

## 2023-06-17 MED ORDER — FENTANYL CITRATE (PF) 100 MCG/2ML IJ SOLN
INTRAMUSCULAR | Status: DC | PRN
  Administered 2023-06-17 (×2): 25 ug via INTRAVENOUS

## 2023-06-17 MED ORDER — POVIDONE-IODINE 10 % EX SWAB: 2.0000 | Freq: Once | CUTANEOUS | Status: DC

## 2023-06-17 MED ORDER — PROPOFOL 500 MG/50ML IV EMUL
INTRAVENOUS | Status: DC | PRN
  Administered 2023-06-17: 60 mg via INTRAVENOUS
  Administered 2023-06-17: 50 mg via INTRAVENOUS
  Administered 2023-06-17: 75 ug/kg/min via INTRAVENOUS
  Administered 2023-06-17: 10 mg via INTRAVENOUS
  Administered 2023-06-17: 20 mg via INTRAVENOUS

## 2023-06-17 MED ORDER — KETOROLAC TROMETHAMINE 30 MG/ML IJ SOLN
INTRAMUSCULAR | Status: AC
  Filled 2023-06-17: qty 1

## 2023-06-17 MED ORDER — CALCIUM CHLORIDE 10 % IV SOLN
INTRAVENOUS | Status: AC
  Filled 2023-06-17: qty 10

## 2023-06-17 MED ORDER — TERAZOSIN HCL 5 MG PO CAPS
5.0000 mg | ORAL_CAPSULE | Freq: Every day | ORAL | Status: DC
  Administered 2023-06-18: 5 mg via ORAL
  Filled 2023-06-17 (×2): qty 1

## 2023-06-17 MED ORDER — POLYETHYLENE GLYCOL 3350 17 G PO PACK: 17.0000 g | PACK | Freq: Every day | ORAL | Status: DC | PRN

## 2023-06-17 MED ORDER — SODIUM CHLORIDE 0.9 % IV SOLN: INTRAVENOUS | Status: DC

## 2023-06-17 MED ORDER — DEXAMETHASONE SODIUM PHOSPHATE 10 MG/ML IJ SOLN
INTRAMUSCULAR | Status: AC
  Filled 2023-06-17: qty 1

## 2023-06-17 MED ORDER — ALBUMIN HUMAN 5 % IV SOLN: INTRAVENOUS | Status: DC | PRN

## 2023-06-17 MED ORDER — PHENYLEPHRINE HCL-NACL 20-0.9 MG/250ML-% IV SOLN
INTRAVENOUS | Status: DC | PRN
  Administered 2023-06-17: 240 ug via INTRAVENOUS
  Administered 2023-06-17 (×2): 160 ug via INTRAVENOUS
  Administered 2023-06-17: 20 ug/min via INTRAVENOUS
  Administered 2023-06-17: 160 ug via INTRAVENOUS
  Administered 2023-06-17: 240 ug via INTRAVENOUS

## 2023-06-17 MED ORDER — METOPROLOL TARTRATE 12.5 MG HALF TABLET
12.5000 mg | ORAL_TABLET | Freq: Two times a day (BID) | ORAL | Status: DC
  Administered 2023-06-17 – 2023-06-18 (×2): 12.5 mg via ORAL
  Filled 2023-06-17 (×2): qty 1

## 2023-06-17 MED ORDER — PANTOPRAZOLE SODIUM 40 MG PO TBEC
40.0000 mg | DELAYED_RELEASE_TABLET | Freq: Every day | ORAL | Status: DC
  Administered 2023-06-17 – 2023-06-18 (×2): 40 mg via ORAL
  Filled 2023-06-17 (×2): qty 1

## 2023-06-17 MED ORDER — ONDANSETRON HCL 4 MG PO TABS: 4.0000 mg | ORAL_TABLET | Freq: Four times a day (QID) | ORAL | Status: DC | PRN

## 2023-06-17 MED ORDER — METHOCARBAMOL 1000 MG/10ML IJ SOLN: 500.0000 mg | Freq: Four times a day (QID) | INTRAMUSCULAR | Status: DC | PRN

## 2023-06-17 MED ORDER — SODIUM CHLORIDE (PF) 0.9 % IJ SOLN
INTRAMUSCULAR | Status: AC
  Filled 2023-06-17: qty 30

## 2023-06-17 MED ORDER — ISOPROPYL ALCOHOL 70 % SOLN
Status: DC | PRN
  Administered 2023-06-17: 1 via TOPICAL

## 2023-06-17 MED ORDER — ONDANSETRON HCL 4 MG/2ML IJ SOLN
INTRAMUSCULAR | Status: AC
  Filled 2023-06-17: qty 2

## 2023-06-17 MED ORDER — ACETAMINOPHEN 325 MG PO TABS: 325.0000 mg | ORAL_TABLET | Freq: Four times a day (QID) | ORAL | Status: DC | PRN

## 2023-06-17 MED ORDER — ONDANSETRON HCL 4 MG/2ML IJ SOLN: 4.0000 mg | Freq: Once | INTRAMUSCULAR | Status: DC | PRN

## 2023-06-17 MED ORDER — ALUM & MAG HYDROXIDE-SIMETH 200-200-20 MG/5ML PO SUSP
30.0000 mL | ORAL | Status: DC | PRN
Start: 2023-06-17 — End: 2023-06-18

## 2023-06-17 MED ORDER — SENNA 8.6 MG PO TABS
1.0000 | ORAL_TABLET | Freq: Two times a day (BID) | ORAL | Status: DC
  Administered 2023-06-17: 8.6 mg via ORAL
  Filled 2023-06-17: qty 1

## 2023-06-17 MED ORDER — ACETAMINOPHEN 500 MG PO TABS
1000.0000 mg | ORAL_TABLET | Freq: Once | ORAL | Status: DC
  Filled 2023-06-17: qty 2

## 2023-06-17 MED ORDER — MORPHINE SULFATE (PF) 2 MG/ML IV SOLN
0.5000 mg | INTRAVENOUS | Status: DC | PRN
Start: 2023-06-17 — End: 2023-06-18
  Administered 2023-06-17: 0.5 mg via INTRAVENOUS
  Filled 2023-06-17: qty 1

## 2023-06-17 MED ORDER — BISACODYL 10 MG RE SUPP: 10.0000 mg | Freq: Every day | RECTAL | Status: DC | PRN

## 2023-06-17 MED ORDER — FENTANYL CITRATE PF 50 MCG/ML IJ SOSY: 25.0000 ug | PREFILLED_SYRINGE | INTRAMUSCULAR | Status: DC | PRN

## 2023-06-17 MED ORDER — METOCLOPRAMIDE HCL 5 MG PO TABS: 5.0000 mg | ORAL_TABLET | Freq: Three times a day (TID) | ORAL | Status: DC | PRN

## 2023-06-17 MED ORDER — HYDROCODONE-ACETAMINOPHEN 7.5-325 MG PO TABS: 1.0000 | ORAL_TABLET | ORAL | Status: DC | PRN

## 2023-06-17 MED ORDER — CHLORHEXIDINE GLUCONATE 0.12 % MT SOLN
15.0000 mL | Freq: Once | OROMUCOSAL | Status: AC
  Administered 2023-06-17: 15 mL via OROMUCOSAL

## 2023-06-17 MED ORDER — ONDANSETRON HCL 4 MG/2ML IJ SOLN: 4.0000 mg | Freq: Four times a day (QID) | INTRAMUSCULAR | Status: DC | PRN

## 2023-06-17 MED ORDER — SODIUM CHLORIDE (PF) 0.9 % IJ SOLN
INTRAMUSCULAR | Status: DC | PRN
  Administered 2023-06-17: 30 mL

## 2023-06-17 MED ORDER — DOCUSATE SODIUM 100 MG PO CAPS
100.0000 mg | ORAL_CAPSULE | Freq: Two times a day (BID) | ORAL | Status: DC
Start: 2023-06-17 — End: 2023-06-18
  Administered 2023-06-17: 100 mg via ORAL
  Filled 2023-06-17: qty 1

## 2023-06-17 MED ORDER — BUPIVACAINE-EPINEPHRINE 0.25% -1:200000 IJ SOLN
INTRAMUSCULAR | Status: DC | PRN
  Administered 2023-06-17: 30 mL

## 2023-06-17 SURGICAL SUPPLY — 49 items
ACE SHELL 3H 52 E HIP (Shell) ×1 IMPLANT
BAG COUNTER SPONGE SURGICOUNT (BAG) IMPLANT
BAG ZIPLOCK 12X15 (MISCELLANEOUS) IMPLANT
CHLORAPREP W/TINT 26 (MISCELLANEOUS) ×1 IMPLANT
COVER PERINEAL POST (MISCELLANEOUS) ×1 IMPLANT
COVER SURGICAL LIGHT HANDLE (MISCELLANEOUS) ×1 IMPLANT
DERMABOND ADVANCED .7 DNX12 (GAUZE/BANDAGES/DRESSINGS) ×2 IMPLANT
DRAPE IMP U-DRAPE 54X76 (DRAPES) ×1 IMPLANT
DRAPE SHEET LG 3/4 BI-LAMINATE (DRAPES) ×3 IMPLANT
DRAPE STERI IOBAN 125X83 (DRAPES) ×1 IMPLANT
DRAPE U-SHAPE 47X51 STRL (DRAPES) ×1 IMPLANT
DRSG AQUACEL AG ADV 3.5X10 (GAUZE/BANDAGES/DRESSINGS) ×1 IMPLANT
ELECT REM PT RETURN 15FT ADLT (MISCELLANEOUS) ×1 IMPLANT
GAUZE SPONGE 4X4 12PLY STRL (GAUZE/BANDAGES/DRESSINGS) ×1 IMPLANT
GLOVE BIO SURGEON STRL SZ7 (GLOVE) ×1 IMPLANT
GLOVE BIO SURGEON STRL SZ8.5 (GLOVE) ×2 IMPLANT
GLOVE BIOGEL PI IND STRL 7.5 (GLOVE) ×1 IMPLANT
GLOVE BIOGEL PI IND STRL 8.5 (GLOVE) ×1 IMPLANT
GOWN SPEC L3 XXLG W/TWL (GOWN DISPOSABLE) ×1 IMPLANT
GOWN STRL REUS W/ TWL XL LVL3 (GOWN DISPOSABLE) ×1 IMPLANT
HEAD FEM +3XOFST 36XMDLR (Orthopedic Implant) IMPLANT
HOLDER FOLEY CATH W/STRAP (MISCELLANEOUS) ×1 IMPLANT
HOOD PEEL AWAY T7 (MISCELLANEOUS) ×3 IMPLANT
IMPL TAPESTRY BIOINTEGR 40X30 (Mesh General) IMPLANT
KIT TURNOVER KIT A (KITS) IMPLANT
LINER ACE G7 HIGH 36 SZ E (Liner) IMPLANT
MANIFOLD NEPTUNE II (INSTRUMENTS) ×1 IMPLANT
MARKER SKIN DUAL TIP RULER LAB (MISCELLANEOUS) ×1 IMPLANT
NDL SAFETY ECLIPSE 18X1.5 (NEEDLE) ×1 IMPLANT
NDL SPNL 18GX3.5 QUINCKE PK (NEEDLE) ×1 IMPLANT
NEEDLE SPNL 18GX3.5 QUINCKE PK (NEEDLE) ×1 IMPLANT
PACK ANTERIOR HIP CUSTOM (KITS) ×1 IMPLANT
SAW OSC TIP CART 19.5X105X1.3 (SAW) ×1 IMPLANT
SEALER BIPOLAR AQUA 6.0 (INSTRUMENTS) ×1 IMPLANT
SET HNDPC FAN SPRY TIP SCT (DISPOSABLE) ×1 IMPLANT
SHELL ACETAB 3H 52 E HIP (Shell) IMPLANT
SOLUTION PRONTOSAN WOUND 350ML (IRRIGATION / IRRIGATOR) ×1 IMPLANT
SPIKE FLUID TRANSFER (MISCELLANEOUS) ×1 IMPLANT
STEM FEM CMTLS HO 12X109 133D (Stem) IMPLANT
SUT MNCRL AB 3-0 PS2 18 (SUTURE) ×1 IMPLANT
SUT MON AB 2-0 CT1 36 (SUTURE) ×1 IMPLANT
SUT STRATAFIX 14 PDO 48 VLT (SUTURE) ×1 IMPLANT
SUT STRATAFIX PDO 1 14 VIOLET (SUTURE) ×1
SUT VIC AB 2-0 CT1 TAPERPNT 27 (SUTURE) IMPLANT
SYR 3ML LL SCALE MARK (SYRINGE) ×1 IMPLANT
TOWEL GREEN STERILE FF (TOWEL DISPOSABLE) ×1 IMPLANT
TRAY FOLEY MTR SLVR 16FR STAT (SET/KITS/TRAYS/PACK) IMPLANT
TUBE SUCTION HIGH CAP CLEAR NV (SUCTIONS) ×1 IMPLANT
WATER STERILE IRR 1000ML POUR (IV SOLUTION) ×1 IMPLANT

## 2023-06-17 NOTE — Care Plan (Signed)
Ortho Bundle Case Management Note  Patient Details  Name: Jared Dunlap MRN: [id number] Date of Birth: [birth year]  R THA on 06-17-23 DCP:  Home with wife DME:  RW ordered through [facility] PT:  HEP                   DME Arranged:  Dan Humphreys rolling DME Agency:  [facility]  HH Arranged:  NA HH Agency:  NA  Additional Comments: Please contact me with any questions of if this plan should need to change.  Ennis Forts, RN,CCM [facility]  [phone] 06/17/2023, 9:11 AM

## 2023-06-17 NOTE — Anesthesia Procedure Notes (Signed)
Procedure Name: MAC Date/Time: 06/17/2023 11:52 AM  Performed by: Sindy Guadeloupe, CRNAPre-anesthesia Checklist: Patient identified, Emergency Drugs available, Suction available, Patient being monitored and Timeout performed Oxygen Delivery Method: Simple face mask Placement Confirmation: positive ETCO2

## 2023-06-17 NOTE — Transfer of Care (Signed)
Immediate Anesthesia Transfer of Care Note  Patient: Jared Dunlap  Procedure(s) Performed: TOTAL HIP ARTHROPLASTY ANTERIOR APPROACH (Right: Hip)  Patient Location: PACU  Anesthesia Type:Spinal  Level of Consciousness: drowsy and patient cooperative  Airway & Oxygen Therapy: Patient Spontanous Breathing and Patient connected to face mask oxygen  Post-op Assessment: Report given to RN and Post -op Vital signs reviewed and stable  Post vital signs: Reviewed and stable  Last Vitals:  Vitals Value Taken Time  BP 126/65 06/17/23 1408  Temp    Pulse 74 06/17/23 1410  Resp 18 06/17/23 1410  SpO2 97 % 06/17/23 1410  Vitals shown include unfiled device data.  Last Pain:  Vitals:   06/17/23 0926  TempSrc: Oral  PainSc: 0-No pain         Complications: No notable events documented.

## 2023-06-17 NOTE — Anesthesia Procedure Notes (Signed)
Spinal  Patient location during procedure: OR Start time: 06/17/2023 11:59 AM End time: 06/17/2023 12:05 PM Reason for block: surgical anesthesia Staffing Performed: anesthesiologist  Anesthesiologist: Trevor Iha, MD Performed by: Trevor Iha, MD Authorized by: Trevor Iha, MD   Preanesthetic Checklist Completed: patient identified, IV checked, risks and benefits discussed, surgical consent, monitors and equipment checked, pre-op evaluation and timeout performed Spinal Block Patient position: sitting Prep: DuraPrep and site prepped and draped Patient monitoring: heart rate, cardiac monitor, continuous pulse ox and blood pressure Approach: midline Location: L3-4 Injection technique: single-shot Needle Needle type: Pencan  Needle gauge: 24 G Needle length: 10 cm Needle insertion depth: 6 cm Assessment Sensory level: T4 Events: CSF return Additional Notes  1 Attempt (s). Pt tolerated procedure well.

## 2023-06-17 NOTE — Anesthesia Postprocedure Evaluation (Signed)
Anesthesia Post Note  Patient: Jared Dunlap  Procedure(s) Performed: TOTAL HIP ARTHROPLASTY ANTERIOR APPROACH (Right: Hip)     Patient location during evaluation: Mother Baby Anesthesia Type: Spinal Level of consciousness: oriented and awake and alert Pain management: pain level controlled Vital Signs Assessment: post-procedure vital signs reviewed and stable Respiratory status: spontaneous breathing and respiratory function stable Cardiovascular status: blood pressure returned to baseline and stable Postop Assessment: no headache, no backache, no apparent nausea or vomiting and able to ambulate Anesthetic complications: no   No notable events documented.  Last Vitals:  Vitals:   06/17/23 1415 06/17/23 1445  BP: 120/71 138/76  Pulse: 73 72  Resp: 19 17  Temp:    SpO2: 96% 99%    Last Pain:  Vitals:   06/17/23 1415  TempSrc:   PainSc: 0-No pain                 Trevor Iha

## 2023-06-17 NOTE — Interval H&P Note (Signed)
History and Physical Interval Note:  06/17/2023 10:46 AM  Consepcion Hearing  has presented today for surgery, with the diagnosis of right hip osteoarthritis.  The various methods of treatment have been discussed with the patient and family. After consideration of risks, benefits and other options for treatment, the patient has consented to  Procedure(s): TOTAL HIP ARTHROPLASTY ANTERIOR APPROACH (Right) as a surgical intervention.  The patient's history has been reviewed, patient examined, no change in status, stable for surgery.  I have reviewed the patient's chart and labs.  Questions were answered to the patient's satisfaction.     [name]

## 2023-06-17 NOTE — Discharge Instructions (Addendum)
 Dr. Brian Swinteck Joint Replacement Specialist [facility] [address] [place], [place] [address] [phone]   TOTAL HIP REPLACEMENT POSTOPERATIVE DIRECTIONS    Hip Rehabilitation, Guidelines Following Surgery   WEIGHT BEARING Weight bearing as tolerated with assist device (walker, cane, etc) as directed, use it as long as suggested by your surgeon or therapist, typically at least 4-6 weeks.  The results of a hip operation are greatly improved after range of motion and muscle strengthening exercises. Follow all safety measures which are given to protect your hip. If any of these exercises cause increased pain or swelling in your joint, decrease the amount until you are comfortable again. Then slowly increase the exercises. Call your caregiver if you have problems or questions.   HOME CARE INSTRUCTIONS  Most of the following instructions are designed to prevent the dislocation of your new hip.  Remove items at home which could result in a fall. This includes throw rugs or furniture in walking pathways.  Continue medications as instructed at time of discharge. You may have some home medications which will be placed on hold until you complete the course of blood thinner medication. You may start showering once you are discharged home. Do not remove your dressing. Do not put on socks or shoes without following the instructions of your caregivers.   Sit on chairs with arms. Use the chair arms to help push yourself up when arising.  Arrange for the use of a toilet seat elevator so you are not sitting low.  Walk with walker as instructed.  You may resume a sexual relationship in one month or when given the OK by your caregiver.  Use walker as long as suggested by your caregivers.  You may put full weight on your legs and walk as much as is comfortable. Avoid periods of inactivity such as sitting longer than an hour when not asleep. This helps prevent blood  clots.  You may return to work once you are cleared by your surgeon.  Do not drive a car for 6 weeks or until released by your surgeon.  Do not drive while taking narcotics.  Wear elastic stockings for two weeks following surgery during the day but you may remove then at night.  Make sure you keep all of your appointments after your operation with all of your doctors and caregivers. You should call the office at the above phone number and make an appointment for approximately two weeks after the date of your surgery. Please pick up a stool softener and laxative for home use as long as you are requiring pain medications. ICE to the affected hip every three hours for 30 minutes at a time and then as needed for pain and swelling. Continue to use ice on the hip for pain and swelling from surgery. You may notice swelling that will progress down to the foot and ankle.  This is normal after surgery.  Elevate the leg when you are not up walking on it.   It is important for you to complete the blood thinner medication as prescribed by your doctor. Continue to use the breathing machine which will help keep your temperature down.  It is common for your temperature to cycle up and down following surgery, especially at night when you are not up moving around and exerting yourself.  The breathing machine keeps your lungs expanded and your temperature down.  RANGE OF MOTION AND STRENGTHENING EXERCISES  These exercises are designed to help you   keep full movement of your hip joint. Follow your caregiver's or physical therapist's instructions. Perform all exercises about fifteen times, three times per day or as directed. Exercise both hips, even if you have had only one joint replacement. These exercises can be done on a training (exercise) mat, on the floor, on a table or on a bed. Use whatever works the best and is most comfortable for you. Use music or television while you are exercising so that the exercises are a  pleasant break in your day. This will make your life better with the exercises acting as a break in routine you can look forward to.  Lying on your back, slowly slide your foot toward your buttocks, raising your knee up off the floor. Then slowly slide your foot back down until your leg is straight again.  Lying on your back spread your legs as far apart as you can without causing discomfort.  Lying on your side, raise your upper leg and foot straight up from the floor as far as is comfortable. Slowly lower the leg and repeat.  Lying on your back, tighten up the muscle in the front of your thigh (quadriceps muscles). You can do this by keeping your leg straight and trying to raise your heel off the floor. This helps strengthen the largest muscle supporting your knee.  Lying on your back, tighten up the muscles of your buttocks both with the legs straight and with the knee bent at a comfortable angle while keeping your heel on the floor.   SKILLED REHAB INSTRUCTIONS: If the patient is transferred to a skilled rehab facility following release from the hospital, a list of the current medications will be sent to the facility for the patient to continue.  When discharged from the skilled rehab facility, please have the facility set up the patient's Home Health Physical Therapy prior to being released. Also, the skilled facility will be responsible for providing the patient with their medications at time of release from the facility to include their pain medication and their blood thinner medication. If the patient is still at the rehab facility at time of the two week follow up appointment, the skilled rehab facility will also need to assist the patient in arranging follow up appointment in our office and any transportation needs.  POST-OPERATIVE OPIOID TAPER INSTRUCTIONS: It is important to wean off of your opioid medication as soon as possible. If you do not need pain medication after your surgery it is ok  to stop day one. Opioids include: Codeine, Hydrocodone(Norco, Vicodin), Oxycodone(Percocet, oxycontin) and hydromorphone amongst others.  Long term and even short term use of opiods can cause: Increased pain response Dependence Constipation Depression Respiratory depression And more.  Withdrawal symptoms can include Flu like symptoms Nausea, vomiting And more Techniques to manage these symptoms Hydrate well Eat regular healthy meals Stay active Use relaxation techniques(deep breathing, meditating, yoga) Do Not substitute Alcohol to help with tapering If you have been on opioids for less than two weeks and do not have pain than it is ok to stop all together.  Plan to wean off of opioids This plan should start within one week post op of your joint replacement. Maintain the same interval or time between taking each dose and first decrease the dose.  Cut the total daily intake of opioids by one tablet each day Next start to increase the time between doses. The last dose that should be eliminated is the evening dose.    MAKE   SURE YOU:  Understand these instructions.  Will watch your condition.  Will get help right away if you are not doing well or get worse.  Pick up stool softner and laxative for home use following surgery while on pain medications. Do not remove your dressing. The dressing is waterproof--it is OK to take showers. Continue to use ice for pain and swelling after surgery. Do not use any lotions or creams on the incision until instructed by your surgeon. Total Hip Protocol.   Information on my medicine - ELIQUIS (apixaban)  This medication education was reviewed with me or my healthcare representative as part of my discharge preparation.    Why was Eliquis prescribed for you? Eliquis was prescribed for you to reduce the risk of blood clots forming after orthopedic surgery.    What do You need to know about Eliquis? Take your Eliquis TWICE DAILY - one  tablet in the morning and one tablet in the evening with or without food.  It would be best to take the dose about the same time each day.  If you have difficulty swallowing the tablet whole please discuss with your pharmacist how to take the medication safely.  Take Eliquis exactly as prescribed by your doctor and DO NOT stop taking Eliquis without talking to the doctor who prescribed the medication.  Stopping without other medication to take the place of Eliquis may increase your risk of developing a clot.  After discharge, you should have regular check-up appointments with your healthcare provider that is prescribing your Eliquis.  What do you do if you miss a dose? If a dose of ELIQUIS is not taken at the scheduled time, take it as soon as possible on the same day and twice-daily administration should be resumed.  The dose should not be doubled to make up for a missed dose.  Do not take more than one tablet of ELIQUIS at the same time.  Important Safety Information A possible side effect of Eliquis is bleeding. You should call your healthcare provider right away if you experience any of the following: Bleeding from an injury or your nose that does not stop. Unusual colored urine (red or dark brown) or unusual colored stools (red or black). Unusual bruising for unknown reasons. A serious fall or if you hit your head (even if there is no bleeding).  Some medicines may interact with Eliquis and might increase your risk of bleeding or clotting while on Eliquis. To help avoid this, consult your healthcare provider or pharmacist prior to using any new prescription or non-prescription medications, including herbals, vitamins, non-steroidal anti-inflammatory drugs (NSAIDs) and supplements.  This website has more information on Eliquis (apixaban): http://www.eliquis.com/eliquis/home  

## 2023-06-17 NOTE — Op Note (Addendum)
OPERATIVE REPORT  SURGEON: Samson Frederic, MD   ASSISTANT: Clint Bolder, PA-C.  PREOPERATIVE DIAGNOSIS: Right hip arthritis.   POSTOPERATIVE DIAGNOSIS: Right hip arthritis.   PROCEDURE: Right total hip arthroplasty, anterior approach.  Tensor fascia lata augmentation.  IMPLANTS: Biomet Taperloc Complete Microplasty stem, size 12 x 109 mm, high offset. Biomet G7 OsseoTi Cup, size 52 mm. Biomet Vivacit-E liner, size 36 mm, E, neutral. Biomet Biolox ceramic head ball, size 36 + 3 mm. Embody Tapestry BioIntegrative Implant, 40 x 30 mm, x2.  ANESTHESIA:  MAC and Spinal  ESTIMATED BLOOD LOSS:-200 mL    ANTIBIOTICS: 2g Ancef.  DRAINS: None.  COMPLICATIONS: None.   CONDITION: PACU - hemodynamically stable.   BRIEF CLINICAL NOTE: Jared Dunlap is a 89 y.o. male with a long-standing history of Right hip arthritis. After failing conservative management, the patient was indicated for total hip arthroplasty. The risks, benefits, and alternatives to the procedure were explained, and the patient elected to proceed.  PROCEDURE IN DETAIL: Surgical site was marked by myself in the pre-op holding area. Once inside the operating room, spinal anesthesia was obtained, and a foley catheter was inserted. The patient was then positioned on the Hana table.  All bony prominences were well padded.  The hip was prepped and draped in the normal sterile surgical fashion.  A time-out was called verifying side and site of surgery. The patient received IV antibiotics within 60 minutes of beginning the procedure.   Bikini incision was made, and superficial dissection was performed lateral to the ASIS. The direct anterior approach to the hip was performed through the Hueter interval.  Lateral femoral circumflex vessels were treated with the Auqumantys. The anterior capsule was exposed and an inverted T capsulotomy was made. The femoral neck cut was made to the level of the templated cut.  A corkscrew was  placed into the head and the head was removed.  The femoral head was found to have eburnated bone. The head was passed to the back table and was measured. Pubofemoral ligament was released off of the calcar, taking care to stay on bone. Superior capsule was released from the greater trochanter, taking care to stay lateral to the posterior border of the femoral neck in order to preserve the short external rotators.   Acetabular exposure was achieved, and the pulvinar and labrum were excised. Sequential reaming of the acetabulum was then performed up to a size 51 mm reamer. A 52 mm cup was then opened and impacted into place at approximately 40 degrees of abduction and 20 degrees of anteversion. The final polyethylene liner was impacted into place and acetabular osteophytes were removed.    I then gained femoral exposure taking care to protect the abductors and greater trochanter.  This was performed using standard external rotation, extension, and adduction.  A cookie cutter was used to enter the femoral canal, and then the femoral canal finder was placed.  Sequential broaching was performed up to a size 12.  Calcar planer was used on the femoral neck remnant.  I placed a high offset neck and a trial head ball.  The hip was reduced.  Leg lengths and offset were checked fluoroscopically.  The hip was dislocated and trial components were removed.  The final implants were placed, and the hip was reduced.  Fluoroscopy was used to confirm component position and leg lengths.  At 90 degrees of external rotation and full extension, the hip was stable to an anterior directed force.   The wound  was copiously irrigated with Prontosan solution and normal saline using pulse lavage.  Marcaine solution was injected into the periarticular soft tissue.  The fascia was repaired with #1 Vicryl and Stratafix.  Due to poor tissue quality, I augmented the TFL repair with a total of 2 Tapestry implants, which were sewn in with 2-0  Monocryl.  The remainder of the wound was closed in layers using 2-0 Vicryl for the subcutaneous fat, 2-0 Monocryl for the deep dermal layer, and staples + Dermabond for the skin.  Once the glue was fully dried, an Aquacell Ag dressing was applied.  The patient was transported to the recovery room in stable condition.  Sponge, needle, and instrument counts were correct at the end of the case x2.  The patient tolerated the procedure well and there were no known complications.  Please note that a surgical assistant was a medical necessity for this procedure to perform it in a safe and expeditious manner. Assistant was necessary to provide appropriate retraction of vital neurovascular structures, to prevent femoral fracture, and to allow for anatomic placement of the prosthesis.

## 2023-06-17 NOTE — Plan of Care (Signed)
  Problem: Education: Goal: Knowledge of General Education information will improve Description: Including pain rating scale, medication(s)/side effects and non-pharmacologic comfort measures Outcome: Progressing   Problem: Activity: Goal: Risk for activity intolerance will decrease Outcome: Progressing   Problem: Pain Management: Goal: General experience of comfort will improve Outcome: Progressing

## 2023-06-17 NOTE — Evaluation (Signed)
Physical Therapy Evaluation Patient Details Name: Jared Dunlap MRN: [id number] DOB: [birth year] Today's Date: 06/17/2023  History of Present Illness  89 yo male presents to therapy s/p R THA, anterior approach on 06/17/2023 due to failure of conservative measures. Pt PMH includes but is not limited to: CAD s/p CABG and stent placement, HLD, HTN, MI x 2, and L THA.  Clinical Impression   Jared Dunlap is a 89 y.o. male POD 0 s/p R THA. Patient reports IND with mobility at baseline. Patient is now limited by functional impairments (see PT problem list below) and requires CGA with multimodal cues  for bed mobility and CGA for transfers. Patient was able to ambulate 6 feet with RW and CGA level of assist. Gait tolerance limited due to reports of dizziness.  Patient will benefit from continued skilled PT interventions to address impairments and progress towards PLOF. Acute PT will follow to progress mobility and stair training in preparation for safe discharge home with family assist and HEP.       If plan is discharge home, recommend the following: A little help with walking and/or transfers;A little help with bathing/dressing/bathroom;Assistance with cooking/housework;Assist for transportation;Help with stairs or ramp for entrance   Can travel by private vehicle        Equipment Recommendations Rolling walker (2 wheels)  Recommendations for Other Services       Functional Status Assessment Patient has had a recent decline in their functional status and demonstrates the ability to make significant improvements in function in a reasonable and predictable amount of time.     Precautions / Restrictions Precautions Precautions: Fall Restrictions Weight Bearing Restrictions Per Provider Order: No      Mobility  Bed Mobility Overal bed mobility: Needs Assistance Bed Mobility: Supine to Sit     Supine to sit: Contact guard, HOB elevated, Used rails     General bed mobility  comments: min cues and tactle cues to initiate R LE to EOB    Transfers Overall transfer level: Needs assistance Equipment used: Rolling walker (2 wheels) Transfers: Sit to/from Stand Sit to Stand: Contact guard assist, From elevated surface           General transfer comment: min cues    Ambulation/Gait Ambulation/Gait assistance: Contact guard assist Gait Distance (Feet): 6 Feet Assistive device: Rolling walker (2 wheels) Gait Pattern/deviations: Step-to pattern, Antalgic, Trunk flexed       General Gait Details: slight flexed trunk use of B UE support to offload R LE in stance phase. pt indicated feeling dizzy at doorway, gait tasks discontinued and recliner placed behind Pt and Bp assessed 181/70 (61 PR)  Stairs            Wheelchair Mobility     Tilt Bed    Modified Rankin (Stroke Patients Only)       Balance Overall balance assessment: Needs assistance Sitting-balance support: Feet supported Sitting balance-Leahy Scale: Good     Standing balance support: Bilateral upper extremity supported, During functional activity, Reliant on assistive device for balance Standing balance-Leahy Scale: Poor                               Pertinent Vitals/Pain Pain Assessment Pain Assessment: 0-10 Pain Score: 0-No pain Pain Location: LR hip and LE Pain Descriptors / Indicators:  (no pain report at eval) Pain Intervention(s): Limited activity within patient's tolerance, Monitored during session, Premedicated before session, Repositioned, Ice applied  Home Living Family/patient expects to be discharged to:: Private residence Living Arrangements: Spouse/significant other Available Help at Discharge: Family (daughers to assist at time of d/c) Type of Home: House Home Access: Stairs to enter Entrance Stairs-Rails: None Secretary/administrator of Steps: 1   Home Layout: One level Home Equipment: None      Prior Function Prior Level of Function :  Independent/Modified Independent;Driving             Mobility Comments: IND no AD for all ADLs self care tasks and IADLs       Extremity/Trunk Assessment        Lower Extremity Assessment Lower Extremity Assessment: RLE deficits/detail RLE Deficits / Details: ankle DF/PF 5/5 RLE Sensation: WNL    Cervical / Trunk Assessment Cervical / Trunk Assessment: Normal  Communication   Communication Communication: Hearing impairment (daughter to bring hearing aids in am)  Cognition Arousal: Alert Behavior During Therapy: WFL for tasks assessed/performed Overall Cognitive Status: Within Functional Limits for tasks assessed                                          General Comments      Exercises     Assessment/Plan    PT Assessment Patient needs continued PT services  PT Problem List Decreased strength;Decreased range of motion;Decreased activity tolerance;Decreased balance;Decreased mobility;Decreased coordination       PT Treatment Interventions DME instruction;Gait training;Stair training;Functional mobility training;Therapeutic activities;Therapeutic exercise;Balance training;Neuromuscular re-education;Patient/family education;Modalities    PT Goals (Current goals can be found in the Care Plan section)  Acute Rehab PT Goals Patient Stated Goal: to be able to get back on the golf course PT Goal Formulation: With patient Time For Goal Achievement: 07/01/23 Potential to Achieve Goals: Good    Frequency 7X/week     Co-evaluation               AM-PAC PT "6 Clicks" Mobility  Outcome Measure Help needed turning from your back to your side while in a flat bed without using bedrails?: A Little Help needed moving from lying on your back to sitting on the side of a flat bed without using bedrails?: A Little Help needed moving to and from a bed to a chair (including a wheelchair)?: A Little Help needed standing up from a chair using your arms  (e.g., wheelchair or bedside chair)?: A Little Help needed to walk in hospital room?: A Little Help needed climbing 3-5 steps with a railing? : Total 6 Click Score: 16    End of Session Equipment Utilized During Treatment: Gait belt Activity Tolerance: Treatment limited secondary to medical complications (Comment) (dizziness) Patient left: in chair;with call bell/phone within reach;with family/visitor present Nurse Communication: Mobility status;Other (comment) (nurse tech and nurse per pt report of dizziness) PT Visit Diagnosis: Unsteadiness on feet (R26.81);Other abnormalities of gait and mobility (R26.89);Muscle weakness (generalized) (M62.81);Difficulty in walking, not elsewhere classified (R26.2)    Time: 7253-6644 PT Time Calculation (min) (ACUTE ONLY): 28 min   Charges:   PT Evaluation $PT Eval Low Complexity: 1 Low PT Treatments $Therapeutic Activity: 8-22 mins PT General Charges $$ ACUTE PT VISIT: 1 Visit         Johnny Bridge, PT Acute Rehab   Jacqualyn Posey 06/17/2023, 6:51 PM

## 2023-06-18 ENCOUNTER — Encounter (HOSPITAL_COMMUNITY): Payer: Self-pay | Admitting: Orthopedic Surgery

## 2023-06-18 DIAGNOSIS — Z87891 Personal history of nicotine dependence: Secondary | ICD-10-CM | POA: Diagnosis not present

## 2023-06-18 DIAGNOSIS — I3481 Nonrheumatic mitral (valve) annulus calcification: Secondary | ICD-10-CM | POA: Diagnosis not present

## 2023-06-18 DIAGNOSIS — M1611 Unilateral primary osteoarthritis, right hip: Secondary | ICD-10-CM | POA: Diagnosis not present

## 2023-06-18 DIAGNOSIS — Z955 Presence of coronary angioplasty implant and graft: Secondary | ICD-10-CM | POA: Diagnosis not present

## 2023-06-18 DIAGNOSIS — I252 Old myocardial infarction: Secondary | ICD-10-CM | POA: Diagnosis not present

## 2023-06-18 DIAGNOSIS — I2510 Atherosclerotic heart disease of native coronary artery without angina pectoris: Secondary | ICD-10-CM | POA: Diagnosis not present

## 2023-06-18 DIAGNOSIS — Z79899 Other long term (current) drug therapy: Secondary | ICD-10-CM | POA: Diagnosis not present

## 2023-06-18 DIAGNOSIS — I10 Essential (primary) hypertension: Secondary | ICD-10-CM | POA: Diagnosis not present

## 2023-06-18 LAB — BASIC METABOLIC PANEL
Anion gap: 11 (ref 5–15)
Anion gap: 7 (ref 5–15)
BUN: 40 mg/dL — ABNORMAL HIGH (ref 8–23)
BUN: 39 mg/dL — ABNORMAL HIGH (ref 8–23)
CO2: 18 mmol/L — ABNORMAL LOW (ref 22–32)
CO2: 23 mmol/L (ref 22–32)
Calcium: 8.2 mg/dL — ABNORMAL LOW (ref 8.9–10.3)
Calcium: 7.9 mg/dL — ABNORMAL LOW (ref 8.9–10.3)
Chloride: 107 mmol/L (ref 98–111)
Chloride: 106 mmol/L (ref 98–111)
Creatinine, Ser: 1.43 mg/dL — ABNORMAL HIGH (ref 0.61–1.24)
Creatinine, Ser: 1.42 mg/dL — ABNORMAL HIGH (ref 0.61–1.24)
GFR, Estimated: 47 mL/min — ABNORMAL LOW (ref >60)
GFR, Estimated: 47 mL/min — ABNORMAL LOW (ref >60)
Glucose, Bld: 111 mg/dL — ABNORMAL HIGH (ref 70–99)
Glucose, Bld: 129 mg/dL — ABNORMAL HIGH (ref 70–99)
Potassium: 3.9 mmol/L (ref 3.5–5.1)
Potassium: 3.4 mmol/L — ABNORMAL LOW (ref 3.5–5.1)
Sodium: 136 mmol/L (ref 135–145)
Sodium: 136 mmol/L (ref 135–145)

## 2023-06-18 LAB — CBC
HCT: 29.6 % — ABNORMAL LOW (ref 39.0–52.0)
Hemoglobin: 9.5 g/dL — ABNORMAL LOW (ref 13.0–17.0)
MCH: 31.3 pg (ref 26.0–34.0)
MCHC: 32.1 g/dL (ref 30.0–36.0)
MCV: 97.4 fL (ref 80.0–100.0)
Platelets: 115 10*3/uL — ABNORMAL LOW (ref 150–400)
RBC: 3.04 MIL/uL — ABNORMAL LOW (ref 4.22–5.81)
RDW: 13.6 % (ref 11.5–15.5)
WBC: 10.5 10*3/uL (ref 4.0–10.5)
nRBC: 0 % (ref 0.0–0.2)

## 2023-06-18 MED ORDER — ONDANSETRON HCL 4 MG PO TABS: 4.0000 mg | ORAL_TABLET | Freq: Three times a day (TID) | ORAL | 0 refills | Status: AC | PRN

## 2023-06-18 MED ORDER — HYDROCODONE-ACETAMINOPHEN 5-325 MG PO TABS: 1.0000 | ORAL_TABLET | ORAL | 0 refills | Status: AC | PRN

## 2023-06-18 MED ORDER — APIXABAN 2.5 MG PO TABS: 2.5000 mg | ORAL_TABLET | Freq: Two times a day (BID) | ORAL | 0 refills | Status: DC

## 2023-06-18 MED ORDER — DOCUSATE SODIUM 100 MG PO CAPS: 100.0000 mg | ORAL_CAPSULE | Freq: Two times a day (BID) | ORAL | 0 refills | Status: AC

## 2023-06-18 MED ORDER — SENNA 8.6 MG PO TABS: 2.0000 | ORAL_TABLET | Freq: Every day | ORAL | 0 refills | Status: AC

## 2023-06-18 MED ORDER — POLYETHYLENE GLYCOL 3350 17 G PO PACK: 17.0000 g | PACK | Freq: Every day | ORAL | 0 refills | Status: AC | PRN

## 2023-06-18 NOTE — TOC Transition Note (Signed)
Transition of Care [facility]) - Discharge Note   Patient Details  Name: Jared Dunlap MRN: [id number] Date of Birth: 03/18/1932  [facility]) CM/SW Contact:  Amada Jupiter, LCSW Phone Number: 06/18/2023, 10:14 AM   Clinical Narrative:     Met with pt and confirming he has received RW to room via [employer].  Plan for HEP.  No further TOC needs.  Final next level of care: Home/Self Care Barriers to Discharge: No Barriers Identified   Patient Goals and CMS Choice Patient states their goals for this hospitalization and ongoing recovery are:: return home          Discharge Placement                       Discharge Plan and Services Additional resources added to the After Visit Summary for                  DME Arranged: Walker rolling DME Agency: [employer]       HH Arranged: NA HH Agency: NA        Social Drivers of Health (SDOH) Interventions SDOH Screenings   Housing: Patient Declined (06/17/2023)  Transportation Needs: Patient Declined (06/17/2023)  Tobacco Use: Medium Risk (06/17/2023)     Readmission Risk Interventions     No data to display

## 2023-06-18 NOTE — Progress Notes (Addendum)
Physical Therapy Treatment Patient Details Name: Jared Dunlap MRN: [id number] DOB: 07/11/1931 Today's Date: 06/18/2023   History of Present Illness 89 yo male presents to therapy s/p R THA, anterior approach on 06/17/2023 due to failure of conservative measures. Pt PMH includes but is not limited to: CAD s/p CABG and stent placement, HLD, HTN, MI x 2, and L THA.    PT Comments  POD # 1 am session General Comments: AxO x 3 pleasant.  Multiple Family members present during session. Assisted OOB to amb in hallway and practice ONE step went well.  General bed mobility comments: demonstarted and instructed how to use belt to guide LE off bed.  General transfer comment: 25% VC's on proper hand placement and safety with turns.  Had Daughter "hands on" assist under Therapist instruction safe handling. General Gait Details: tolerated a functional distance of 55 feet 25% VC's on proper walker use and safety with turns.  Amb "hands on" using safety belt with Daughter under Therapist instruction on safe handling. General stair comments: practiced ONE step with Daughter "hands on" using safety belt 50% VC's on proper tech using walker up/down ONE step.  Performed twice. Then returned to room to perform some TE's following HEP handout.  Instructed on proper tech, freq as well as use of ICE.   Addressed all mobility questions, discussed appropriate activity, educated on use of ICE.  Pt ready for D/C to home.    If plan is discharge home, recommend the following: A little help with walking and/or transfers;A little help with bathing/dressing/bathroom;Assistance with cooking/housework;Assist for transportation;Help with stairs or ramp for entrance   Can travel by private vehicle        Equipment Recommendations  Rolling walker (2 wheels)    Recommendations for Other Services       Precautions / Restrictions Precautions Precautions: Fall Restrictions Weight Bearing Restrictions Per Provider Order: No      Mobility  Bed Mobility Overal bed mobility: Needs Assistance Bed Mobility: Supine to Sit     Supine to sit: Contact guard, HOB elevated, Used rails     General bed mobility comments: demonstarted and instructed how to use belt to guide LE off bed    Transfers Overall transfer level: Needs assistance Equipment used: Rolling walker (2 wheels) Transfers: Sit to/from Stand Sit to Stand: Contact guard assist, From elevated surface           General transfer comment: 25% VC's on proper hand placement and safety with turns.  Had Daughter "hands on" assist under Therapist instruction safe handling.    Ambulation/Gait Ambulation/Gait assistance: Supervision, Contact guard assist Gait Distance (Feet): 55 Feet Assistive device: Rolling walker (2 wheels) Gait Pattern/deviations: Step-to pattern Gait velocity: decreased     General Gait Details: tolerated a functional distance of 55 feet 25% VC's on proper walker use and safety with turns.  Amb "hands on" using safety belt with Daughter under Therapist instruction on safe handling.   Stairs Stairs: Yes Stairs assistance: Supervision, Contact guard assist Stair Management: No rails, Step to pattern, Forwards, With walker Number of Stairs: 1 General stair comments: practiced ONE step with Daughter "hands on" using safety belt 50% VC's on proper tech using walker up/down ONE step.  Performed twice.   Wheelchair Mobility     Tilt Bed    Modified Rankin (Stroke Patients Only)       Balance  Cognition Arousal: Alert Behavior During Therapy: WFL for tasks assessed/performed Overall Cognitive Status: Within Functional Limits for tasks assessed                                 General Comments: AxO x 3 pleasant.  Multiple Family members present during session.        Exercises  Total Hip Replacement TE's following HEP Handout 10 reps ankle  pumps 05 reps knee presses 05 reps heel slides 05 reps SAQ's 05 reps ABD Instructed how to use a belt loop to assist  Followed by ICE     General Comments        Pertinent Vitals/Pain Pain Assessment Pain Assessment: 0-10 Pain Score: 7  Pain Location: R hip Pain Descriptors / Indicators: Aching, Discomfort, Operative site guarding Pain Intervention(s): Monitored during session, Premedicated before session, Repositioned, Ice applied    Home Living                          Prior Function            PT Goals (current goals can now be found in the care plan section) Progress towards PT goals: Progressing toward goals    Frequency    7X/week      PT Plan      Co-evaluation              AM-PAC PT "6 Clicks" Mobility   Outcome Measure  Help needed turning from your back to your side while in a flat bed without using bedrails?: A Little Help needed moving from lying on your back to sitting on the side of a flat bed without using bedrails?: A Little Help needed moving to and from a bed to a chair (including a wheelchair)?: A Little Help needed standing up from a chair using your arms (e.g., wheelchair or bedside chair)?: A Little Help needed to walk in hospital room?: A Little Help needed climbing 3-5 steps with a railing? : A Little 6 Click Score: 18    End of Session Equipment Utilized During Treatment: Gait belt Activity Tolerance: Patient tolerated treatment well Patient left: in chair;with call bell/phone within reach;with family/visitor present Nurse Communication: Mobility status PT Visit Diagnosis: Unsteadiness on feet (R26.81);Other abnormalities of gait and mobility (R26.89);Muscle weakness (generalized) (M62.81);Difficulty in walking, not elsewhere classified (R26.2)     Time: 1020-1100 PT Time Calculation (min) (ACUTE ONLY): 40 min  Charges:    $Gait Training: 8-22 mins $Therapeutic Exercise: 8-22 mins $Therapeutic Activity:  8-22 mins PT General Charges $$ ACUTE PT VISIT: 1 Visit                    Felecia Shelling  PTA Acute  Rehabilitation Services Office M-F          [phone]

## 2023-06-18 NOTE — Progress Notes (Signed)
    Subjective:  Patient reports pain as mild.  Denies N/V/CP/SOB/Abd pain. He reports pain controlled. Denies tingling and numbness in LE bilaterally.   All questions solicited and answered.   Objective:   VITALS:   Vitals:   06/17/23 2134 06/18/23 0116 06/18/23 0547 06/18/23 1004  BP: (!) 116/55 132/62 126/62 (!) 121/54  Pulse: 73 63 98 (!) 57  Resp: 14 18 18 16   Temp: 97.9 F (36.6 C) 98.3 F (36.8 C) 97.9 F (36.6 C) 97.9 F (36.6 C)  TempSrc:  Oral Oral Oral  SpO2: 97% 97% 100% 98%  Weight:      Height:        Patient sitting up in bed. NAD Neurologically intact ABD soft Neurovascular intact Sensation intact distally Intact pulses distally Dorsiflexion/Plantar flexion intact Incision: dressing C/D/I No cellulitis present Compartment soft   Lab Results  Component Value Date   WBC 10.5 06/18/2023   HGB 9.5 (L) 06/18/2023   HCT 29.6 (L) 06/18/2023   MCV 97.4 06/18/2023   PLT 115 (L) 06/18/2023   BMET    Component Value Date/Time   NA 136 06/18/2023 0342   NA 140 01/18/2020 0840   K 3.9 06/18/2023 0342   CL 107 06/18/2023 0342   CO2 18 (L) 06/18/2023 0342   GLUCOSE 111 (H) 06/18/2023 0342   BUN 40 (H) 06/18/2023 0342   BUN 27 01/18/2020 0840   CREATININE 1.43 (H) 06/18/2023 0342   CREATININE 1.07 01/04/2015 1024   CALCIUM 8.2 (L) 06/18/2023 0342   GFRNONAA 47 (L) 06/18/2023 0342     Assessment/Plan: 1 Day Post-Op   Principal Problem:   Osteoarthritis of right hip Active Problems:   S/P total right hip arthroplasty  ABLA. Hemoglobin 9.4. Continue to monitor.   WBAT with walker DVT ppx:  Eliquis , SCDs, TEDS PO pain control PT/OT: 6 feet with PT yesterday.  Dispo:  - Creatinine elevated 1.43 this morning (1.12 prior to admission). Continue IV fluids and encourage PO fluids. Recheck BMP 2:00pm. If trending down and clears PT can d/c home with HEP.    Clois Dupes 06/18/2023, 10:20 AM   [facility]  [place] [address], [address], [place], [place] [address] Phone: [phone] [email or web address] Facebook  [employer]

## 2023-06-19 NOTE — Discharge Summary (Signed)
Physician Discharge Summary  Patient ID: MONTRE STEMARIE MRN: [id number] DOB/AGE: May 29, 1932 89 y.o.  Admit date: 06/17/2023 Discharge date: 06/18/2023  Admission Diagnoses:  Osteoarthritis of right hip  Discharge Diagnoses:  Principal Problem:   Osteoarthritis of right hip Active Problems:   S/P total right hip arthroplasty   Past Medical History:  Diagnosis Date   Arthritis    CAD (coronary artery disease) of artery bypass graft 04/18/2013   CABG x 6 1998; PCI to native LCx 2001    CAD in native artery 02/05/2016   Coronary artery disease    followed by Dr. Tresa Endo - last office visit 06/08/13   Essential hypertension 04/18/2013   History of kidney stones    Hyperlipidemia with target LDL less than 70 04/18/2013   Hypertension    MI (myocardial infarction) (HCC) 1998    x2 MI's    Surgeries: Procedure(s): TOTAL HIP ARTHROPLASTY ANTERIOR APPROACH on 06/17/2023   Consultants (if any):   Discharged Condition: Improved  Hospital Course: KEREL ZEIDERS is an 89 y.o. male who was admitted 06/17/2023 with a diagnosis of Osteoarthritis of right hip and went to the operating room on 06/17/2023 and underwent the above named procedures.    He was given perioperative antibiotics:  Anti-infectives (From admission, onward)    Start     Dose/Rate Route Frequency Ordered Stop   06/17/23 1800  ceFAZolin (ANCEF) IVPB 2g/100 mL premix        2 g 200 mL/hr over 30 Minutes Intravenous Every 6 hours 06/17/23 1613 06/18/23 0151   06/17/23 0900  ceFAZolin (ANCEF) IVPB 2g/100 mL premix        2 g 200 mL/hr over 30 Minutes Intravenous On call to O.R. 06/17/23 0851 06/17/23 1211       He was given sequential compression devices, early ambulation, and Eliquis for DVT prophylaxis.  POD#1. Patients creatinine elevated 1.43. Recheck BMP 1.42 with downtrend. ABLA hemoglobin 9.4 asymptomatic. Ambulated 55 feet with PT. D/c home with HEP.   He benefited maximally from the hospital stay  and there were no complications.    Recent vital signs:  Vitals:   06/18/23 1004 06/18/23 1425  BP: (!) 121/54 (!) 129/90  Pulse: (!) 57 60  Resp: 16 17  Temp: 97.9 F (36.6 C) (!) 97.5 F (36.4 C)  SpO2: 98% 98%    Recent laboratory studies:  Lab Results  Component Value Date   HGB 9.5 (L) 06/18/2023   HGB 12.1 (L) 06/11/2023   HGB 12.4 (L) 01/18/2020   Lab Results  Component Value Date   WBC 10.5 06/18/2023   PLT 115 (L) 06/18/2023   No results found for: "INR" Lab Results  Component Value Date   NA 136 06/18/2023   K 3.4 (L) 06/18/2023   CL 106 06/18/2023   CO2 23 06/18/2023   BUN 39 (H) 06/18/2023   CREATININE 1.42 (H) 06/18/2023   GLUCOSE 129 (H) 06/18/2023     Allergies as of 06/18/2023       Reactions   Atorvastatin Calcium Other (See Comments)   Caused forgetfulness,and made the joints hurt   Ezetimibe Other (See Comments)   Caused forgetfulness,and made the joints hurt   Ezetimibe-simvastatin Other (See Comments)   Caused forgetfulness,and made the joints hurt   Fluvastatin Other (See Comments)   Name brand is Lescol Caused forgetfulness,and made the joints hurt   Pravastatin Sodium Other (See Comments)   Caused forgetfulness,and made the joints hurt   Rosuvastatin Other (See  Comments)   Caused forgetfulness,and made the joints hurt   Simvastatin Other (See Comments)   Caused forgetfulness,and made the joints hurt   Welchol [colesevelam Hcl] Other (See Comments)   Yellow Jacket Venom Other (See Comments)   Extreme pain, hands and ankles became swollen, oozes at site stung        Medication List     TAKE these medications    acetaminophen 500 MG tablet Commonly known as: TYLENOL Take 2 tablets (1,000 mg total) by mouth every 6 (six) hours as needed. What changed:  how much to take when to take this reasons to take this   apixaban 2.5 MG Tabs tablet Commonly known as: Eliquis Take 1 tablet (2.5 mg total) by mouth 2 (two) times  daily.   ascorbic acid 500 MG tablet Commonly known as: VITAMIN C Take 500 mg by mouth 2 (two) times daily.   benazepril 40 MG tablet Commonly known as: LOTENSIN TAKE 1 TABLET EVERY DAY   CoQ10 100 MG Caps Take 100 mg by mouth daily.   docusate sodium 100 MG capsule Commonly known as: Colace Take 1 capsule (100 mg total) by mouth 2 (two) times daily.   fish oil-omega-3 fatty acids 1000 MG capsule Take 2 g by mouth daily.   FLAX SEEDS PO Take 5 mLs by mouth daily.   folic acid 400 MCG tablet Commonly known as: FOLVITE Take 1 tablet (400 mcg total) by mouth daily.   HYDROcodone-acetaminophen 5-325 MG tablet Commonly known as: NORCO/VICODIN Take 1 tablet by mouth every 4 (four) hours as needed for up to 7 days for moderate pain (pain score 4-6) or severe pain (pain score 7-10).   Lecithin 1200 MG Caps Take 1,200 mg by mouth daily.   metoprolol tartrate 25 MG tablet Commonly known as: LOPRESSOR TAKE 1/2 TABLET TWICE DAILY   multivitamin with minerals tablet Take 1 tablet by mouth daily. Men +   PreserVision AREDS 2 Caps Take 1 capsule by mouth 2 (two) times daily.   ondansetron 4 MG tablet Commonly known as: Zofran Take 1 tablet (4 mg total) by mouth every 8 (eight) hours as needed for nausea or vomiting.   OSTEO BI-FLEX REGULAR STRENGTH PO Take 1 tablet by mouth daily. Triple   polyethylene glycol 17 g packet Commonly known as: MiraLax Take 17 g by mouth daily as needed for mild constipation or moderate constipation.   pyridOXINE 50 MG tablet Commonly known as: B-6 Take 50 mg by mouth daily.   senna 8.6 MG Tabs tablet Commonly known as: SENOKOT Take 2 tablets (17.2 mg total) by mouth at bedtime for 15 days.   Systane Ultra 0.4-0.3 % Soln Generic drug: Polyethyl Glycol-Propyl Glycol Place 1 drop into both eyes 2 (two) times daily.   terazosin 5 MG capsule Commonly known as: HYTRIN Take 5 mg by mouth at bedtime.   TUMS PO Take 1 tablet by mouth daily  as needed (Heartburn).   vitamin B-12 500 MCG tablet Commonly known as: CYANOCOBALAMIN Take 500 mcg by mouth daily.               Discharge Care Instructions  (From admission, onward)           Start     Ordered   06/18/23 0000  Weight bearing as tolerated        06/18/23 1055   06/18/23 0000  Change dressing       Comments: Do not change your dressing.   06/18/23 1055  WEIGHT BEARING   Weight bearing as tolerated with assist device (walker, cane, etc) as directed, use it as long as suggested by your surgeon or therapist, typically at least 4-6 weeks.   EXERCISES  Results after joint replacement surgery are often greatly improved when you follow the exercise, range of motion and muscle strengthening exercises prescribed by your doctor. Safety measures are also important to protect the joint from further injury. Any time any of these exercises cause you to have increased pain or swelling, decrease what you are doing until you are comfortable again and then slowly increase them. If you have problems or questions, call your caregiver or physical therapist for advice.   Rehabilitation is important following a joint replacement. After just a few days of immobilization, the muscles of the leg can become weakened and shrink (atrophy).  These exercises are designed to build up the tone and strength of the thigh and leg muscles and to improve motion. Often times heat used for twenty to thirty minutes before working out will loosen up your tissues and help with improving the range of motion but do not use heat for the first two weeks following surgery (sometimes heat can increase post-operative swelling).   These exercises can be done on a training (exercise) mat, on the floor, on a table or on a bed. Use whatever works the best and is most comfortable for you.    Use music or television while you are exercising so that the exercises are a pleasant break in your day.  This will make your life better with the exercises acting as a break in your routine that you can look forward to.   Perform all exercises about fifteen times, three times per day or as directed.  You should exercise both the operative leg and the other leg as well.  Exercises include:   Quad Sets - Tighten up the muscle on the front of the thigh (Quad) and hold for 5-10 seconds.   Straight Leg Raises - With your knee straight (if you were given a brace, keep it on), lift the leg to 60 degrees, hold for 3 seconds, and slowly lower the leg.  Perform this exercise against resistance later as your leg gets stronger.  Leg Slides: Lying on your back, slowly slide your foot toward your buttocks, bending your knee up off the floor (only go as far as is comfortable). Then slowly slide your foot back down until your leg is flat on the floor again.  Angel Wings: Lying on your back spread your legs to the side as far apart as you can without causing discomfort.  Hamstring Strength:  Lying on your back, push your heel against the floor with your leg straight by tightening up the muscles of your buttocks.  Repeat, but this time bend your knee to a comfortable angle, and push your heel against the floor.  You may put a pillow under the heel to make it more comfortable if necessary.   A rehabilitation program following joint replacement surgery can speed recovery and prevent re-injury in the future due to weakened muscles. Contact your doctor or a physical therapist for more information on knee rehabilitation.    CONSTIPATION  Constipation is defined medically as fewer than three stools per week and severe constipation as less than one stool per week.  Even if you have a regular bowel pattern at home, your normal regimen is likely to be disrupted due to multiple reasons following surgery.  Combination  of anesthesia, postoperative narcotics, change in appetite and fluid intake all can affect your bowels.   YOU MUST  use at least one of the following options; they are listed in order of increasing strength to get the job done.  They are all available over the counter, and you may need to use some, POSSIBLY even all of these options:    Drink plenty of fluids (prune juice may be helpful) and high fiber foods Colace 100 mg by mouth twice a day  Senokot for constipation as directed and as needed Dulcolax (bisacodyl), take with full glass of water  Miralax (polyethylene glycol) once or twice a day as needed.  If you have tried all these things and are unable to have a bowel movement in the first 3-4 days after surgery call either your surgeon or your primary doctor.    If you experience loose stools or diarrhea, hold the medications until you stool forms back up.  If your symptoms do not get better within 1 week or if they get worse, check with your doctor.  If you experience "the worst abdominal pain ever" or develop nausea or vomiting, please contact the office immediately for further recommendations for treatment.   ITCHING:  If you experience itching with your medications, try taking only a single pain pill, or even half a pain pill at a time.  You can also use Benadryl over the counter for itching or also to help with sleep.   TED HOSE STOCKINGS:  Use stockings on both legs until for at least 2 weeks or as directed by physician office. They may be removed at night for sleeping.  MEDICATIONS:  See your medication summary on the "After Visit Summary" that nursing will review with you.  You may have some home medications which will be placed on hold until you complete the course of blood thinner medication.  It is important for you to complete the blood thinner medication as prescribed.  PRECAUTIONS:  If you experience chest pain or shortness of breath - call 911 immediately for transfer to the hospital emergency department.   If you develop a fever greater that 101 F, purulent drainage from wound, increased  redness or drainage from wound, foul odor from the wound/dressing, or calf pain - CONTACT YOUR SURGEON.                                                   FOLLOW-UP APPOINTMENTS:  If you do not already have a post-op appointment, please call the office for an appointment to be seen by your surgeon.  Guidelines for how soon to be seen are listed in your "After Visit Summary", but are typically between 1-4 weeks after surgery.  OTHER INSTRUCTIONS:   Knee Replacement:  Do not place pillow under knee, focus on keeping the knee straight while resting. CPM instructions: 0-90 degrees, 2 hours in the morning, 2 hours in the afternoon, and 2 hours in the evening. Place foam block, curve side up under heel at all times except when in CPM or when walking.  DO NOT modify, tear, cut, or change the foam block in any way.   MAKE SURE YOU:  Understand these instructions.  Get help right away if you are not doing well or get worse.    Thank you for letting us be a part  of your medical care team.  It is a privilege we respect greatly.  We hope these instructions will help you stay on track for a fast and full recovery!   Diagnostic Studies: DG Pelvis Portable Result Date: 06/17/2023 CLINICAL DATA:  Status post total right knee arthroplasty. EXAM: PORTABLE PELVIS 1-2 VIEWS COMPARISON:  CT abdomen and pelvis 01/17/2015 FINDINGS: Interval total right hip arthroplasty. Redemonstration of total left hip arthroplasty. No perihardware lucency is seen to indicate hardware failure or lucency. Expected postoperative subcutaneous air lateral to the right pelvis and proximal femur. Mild bilateral sacroiliac joint subchondral sclerosis. The pubic symphysis joint space is maintained. No acute fracture or dislocation. IMPRESSION: Interval total right hip arthroplasty without evidence of hardware failure. Electronically Signed   By: Neita Garnet M.D.   On: 06/17/2023 16:48   DG HIP UNILAT WITH PELVIS 1V RIGHT Result Date:  06/17/2023 CLINICAL DATA:  Elective surgery.  Total right hip arthroplasty. EXAM: DG HIP (WITH OR WITHOUT PELVIS) 1V RIGHT COMPARISON:  CT chest, abdomen, and pelvis 01/17/2015 FINDINGS: Images were performed intraoperatively without the presence of a radiologist. Moderate superior femoroacetabular joint space narrowing. The patient is undergoing total right hip arthroplasty. Partial visualization of prior remote total left hip arthroplasty. No hardware complication is seen. Total fluoroscopy images: 8 Total fluoroscopy time: 8 seconds Total dose: Radiation Exposure Index (as provided by the fluoroscopic device): 1.14 mGy air Kerma Please see intraoperative findings for further detail. IMPRESSION: Intraoperative fluoroscopy for total right hip arthroplasty. Electronically Signed   By: Neita Garnet M.D.   On: 06/17/2023 16:46   DG C-Arm 1-60 Min-No Report Result Date: 06/17/2023 Fluoroscopy was utilized by the requesting physician.  No radiographic interpretation.   DG C-Arm 1-60 Min-No Report Result Date: 06/17/2023 Fluoroscopy was utilized by the requesting physician.  No radiographic interpretation.    Disposition: Discharge disposition: 01-Home or Self Care       Discharge Instructions     Call MD / Call 911   Complete by: As directed    If you experience chest pain or shortness of breath, CALL 911 and be transported to the hospital emergency room.  If you develope a fever above 101 F, pus (white drainage) or increased drainage or redness at the wound, or calf pain, call your surgeon's office.   Change dressing   Complete by: As directed    Do not change your dressing.   Constipation Prevention   Complete by: As directed    Drink plenty of fluids.  Prune juice may be helpful.  You may use a stool softener, such as Colace (over the counter) 100 mg twice a day.  Use MiraLax (over the counter) for constipation as needed.   Diet - low sodium heart healthy   Complete by: As directed     Discharge instructions   Complete by: As directed    Elevate toes above nose. Use cryotherapy as needed for pain and swelling.   Driving restrictions   Complete by: As directed    No driving for 6 weeks   Increase activity slowly as tolerated   Complete by: As directed    Lifting restrictions   Complete by: As directed    No lifting for 6 weeks   Post-operative opioid taper instructions:   Complete by: As directed    POST-OPERATIVE OPIOID TAPER INSTRUCTIONS: It is important to wean off of your opioid medication as soon as possible. If you do not need pain medication after your surgery it is  ok to stop day one. Opioids include: Codeine, Hydrocodone(Norco, Vicodin), Oxycodone(Percocet, oxycontin) and hydromorphone amongst others.  Long term and even short term use of opiods can cause: Increased pain response Dependence Constipation Depression Respiratory depression And more.  Withdrawal symptoms can include Flu like symptoms Nausea, vomiting And more Techniques to manage these symptoms Hydrate well Eat regular healthy meals Stay active Use relaxation techniques(deep breathing, meditating, yoga) Do Not substitute Alcohol to help with tapering If you have been on opioids for less than two weeks and do not have pain than it is ok to stop all together.  Plan to wean off of opioids This plan should start within one week post op of your joint replacement. Maintain the same interval or time between taking each dose and first decrease the dose.  Cut the total daily intake of opioids by one tablet each day Next start to increase the time between doses. The last dose that should be eliminated is the evening dose.      TED hose   Complete by: As directed    Use stockings (TED hose) for 2 weeks on both leg(s).  You may remove them at night for sleeping.   Weight bearing as tolerated   Complete by: As directed         Follow-up Information     Clois Dupes, PA-C. Go on  07/03/2023.   Specialty: Orthopedic Surgery Why: You are scheduled for a follow up appointment on 07-03-23 at 10:00 am. Contact information: [address], [address] [place] [address] [phone]                  Signed: Clois Dupes 06/19/2023, 3:55 PM

## 2024-04-13 DIAGNOSIS — H5211 Myopia, right eye: Secondary | ICD-10-CM | POA: Diagnosis not present

## 2024-04-13 DIAGNOSIS — H2513 Age-related nuclear cataract, bilateral: Secondary | ICD-10-CM | POA: Diagnosis not present

## 2024-04-19 DIAGNOSIS — Z23 Encounter for immunization: Secondary | ICD-10-CM | POA: Diagnosis not present

## 2024-05-05 DIAGNOSIS — I10 Essential (primary) hypertension: Secondary | ICD-10-CM | POA: Diagnosis not present

## 2024-05-05 DIAGNOSIS — E038 Other specified hypothyroidism: Secondary | ICD-10-CM | POA: Diagnosis not present

## 2024-05-05 DIAGNOSIS — R7301 Impaired fasting glucose: Secondary | ICD-10-CM | POA: Diagnosis not present

## 2024-05-05 DIAGNOSIS — I2510 Atherosclerotic heart disease of native coronary artery without angina pectoris: Secondary | ICD-10-CM | POA: Diagnosis not present

## 2024-05-05 DIAGNOSIS — N1831 Chronic kidney disease, stage 3a: Secondary | ICD-10-CM | POA: Diagnosis not present

## 2024-05-09 DIAGNOSIS — I2510 Atherosclerotic heart disease of native coronary artery without angina pectoris: Secondary | ICD-10-CM | POA: Diagnosis not present

## 2024-05-09 DIAGNOSIS — E038 Other specified hypothyroidism: Secondary | ICD-10-CM | POA: Diagnosis not present

## 2024-05-09 DIAGNOSIS — Z0189 Encounter for other specified special examinations: Secondary | ICD-10-CM | POA: Diagnosis not present

## 2024-05-09 DIAGNOSIS — N1831 Chronic kidney disease, stage 3a: Secondary | ICD-10-CM | POA: Diagnosis not present

## 2024-05-09 DIAGNOSIS — Z23 Encounter for immunization: Secondary | ICD-10-CM | POA: Diagnosis not present

## 2024-05-09 DIAGNOSIS — I10 Essential (primary) hypertension: Secondary | ICD-10-CM | POA: Diagnosis not present

## 2024-05-09 DIAGNOSIS — I350 Nonrheumatic aortic (valve) stenosis: Secondary | ICD-10-CM | POA: Diagnosis not present

## 2024-05-09 DIAGNOSIS — R7301 Impaired fasting glucose: Secondary | ICD-10-CM | POA: Diagnosis not present

## 2024-05-09 DIAGNOSIS — Z0000 Encounter for general adult medical examination without abnormal findings: Secondary | ICD-10-CM | POA: Diagnosis not present

## 2024-05-17 ENCOUNTER — Emergency Department (HOSPITAL_COMMUNITY)
Admission: EM | Admit: 2024-05-17 | Discharge: 2024-05-17 | Disposition: A | Discharge status: Home / Self Care | Attending: Emergency Medicine | Admitting: Emergency Medicine

## 2024-05-17 ENCOUNTER — Emergency Department (HOSPITAL_COMMUNITY)

## 2024-05-17 ENCOUNTER — Encounter (HOSPITAL_COMMUNITY): Payer: Self-pay | Admitting: Emergency Medicine

## 2024-05-17 DIAGNOSIS — M51379 Other intervertebral disc degeneration, lumbosacral region without mention of lumbar back pain or lower extremity pain: Secondary | ICD-10-CM | POA: Diagnosis not present

## 2024-05-17 DIAGNOSIS — N281 Cyst of kidney, acquired: Secondary | ICD-10-CM | POA: Diagnosis not present

## 2024-05-17 DIAGNOSIS — R310 Gross hematuria: Secondary | ICD-10-CM | POA: Diagnosis not present

## 2024-05-17 DIAGNOSIS — R319 Hematuria, unspecified: Secondary | ICD-10-CM | POA: Diagnosis not present

## 2024-05-17 DIAGNOSIS — N401 Enlarged prostate with lower urinary tract symptoms: Secondary | ICD-10-CM | POA: Diagnosis not present

## 2024-05-17 DIAGNOSIS — I10 Essential (primary) hypertension: Secondary | ICD-10-CM | POA: Insufficient documentation

## 2024-05-17 DIAGNOSIS — Z79899 Other long term (current) drug therapy: Secondary | ICD-10-CM | POA: Insufficient documentation

## 2024-05-17 DIAGNOSIS — I2510 Atherosclerotic heart disease of native coronary artery without angina pectoris: Secondary | ICD-10-CM | POA: Insufficient documentation

## 2024-05-17 DIAGNOSIS — N210 Calculus in bladder: Secondary | ICD-10-CM | POA: Diagnosis not present

## 2024-05-17 DIAGNOSIS — Z7901 Long term (current) use of anticoagulants: Secondary | ICD-10-CM | POA: Diagnosis not present

## 2024-05-17 DIAGNOSIS — R10A1 Flank pain, right side: Secondary | ICD-10-CM | POA: Insufficient documentation

## 2024-05-17 DIAGNOSIS — M47816 Spondylosis without myelopathy or radiculopathy, lumbar region: Secondary | ICD-10-CM | POA: Diagnosis not present

## 2024-05-17 DIAGNOSIS — N200 Calculus of kidney: Secondary | ICD-10-CM | POA: Diagnosis not present

## 2024-05-17 DIAGNOSIS — Z951 Presence of aortocoronary bypass graft: Secondary | ICD-10-CM | POA: Insufficient documentation

## 2024-05-17 DIAGNOSIS — R339 Retention of urine, unspecified: Secondary | ICD-10-CM

## 2024-05-17 DIAGNOSIS — Z96643 Presence of artificial hip joint, bilateral: Secondary | ICD-10-CM | POA: Insufficient documentation

## 2024-05-17 DIAGNOSIS — M51369 Other intervertebral disc degeneration, lumbar region without mention of lumbar back pain or lower extremity pain: Secondary | ICD-10-CM | POA: Diagnosis not present

## 2024-05-17 DIAGNOSIS — M48061 Spinal stenosis, lumbar region without neurogenic claudication: Secondary | ICD-10-CM | POA: Diagnosis not present

## 2024-05-17 DIAGNOSIS — M549 Dorsalgia, unspecified: Secondary | ICD-10-CM | POA: Diagnosis not present

## 2024-05-17 DIAGNOSIS — F17210 Nicotine dependence, cigarettes, uncomplicated: Secondary | ICD-10-CM | POA: Insufficient documentation

## 2024-05-17 DIAGNOSIS — K5730 Diverticulosis of large intestine without perforation or abscess without bleeding: Secondary | ICD-10-CM | POA: Diagnosis not present

## 2024-05-17 DIAGNOSIS — R330 Drug induced retention of urine: Secondary | ICD-10-CM | POA: Diagnosis not present

## 2024-05-17 LAB — COMPREHENSIVE METABOLIC PANEL WITH GFR
ALT: 20 U/L (ref 0–44)
AST: 32 U/L (ref 15–41)
Albumin: 3.9 g/dL (ref 3.5–5.0)
Alkaline Phosphatase: 85 U/L (ref 38–126)
Anion gap: 11 (ref 5–15)
BUN: 33 mg/dL — ABNORMAL HIGH (ref 8–23)
CO2: 23 mmol/L (ref 22–32)
Calcium: 9.2 mg/dL (ref 8.9–10.3)
Chloride: 108 mmol/L (ref 98–111)
Creatinine, Ser: 1.3 mg/dL — ABNORMAL HIGH (ref 0.61–1.24)
GFR, Estimated: 52 mL/min — ABNORMAL LOW (ref >60)
Glucose, Bld: 107 mg/dL — ABNORMAL HIGH (ref 70–99)
Potassium: 4.1 mmol/L (ref 3.5–5.1)
Sodium: 141 mmol/L (ref 135–145)
Total Bilirubin: 0.5 mg/dL (ref 0.0–1.2)
Total Protein: 6.8 g/dL (ref 6.5–8.1)

## 2024-05-17 LAB — URINALYSIS, ROUTINE W REFLEX MICROSCOPIC
Bilirubin Urine: NEGATIVE
Glucose, UA: 50 mg/dL — AB
Ketones, ur: NEGATIVE mg/dL
Leukocytes,Ua: NEGATIVE
Nitrite: NEGATIVE
Protein, ur: 100 mg/dL — AB
Specific Gravity, Urine: 1.002 — ABNORMAL LOW (ref 1.005–1.030)
pH: 6 (ref 5.0–8.0)

## 2024-05-17 LAB — CBC
HCT: 34 % — ABNORMAL LOW (ref 39.0–52.0)
Hemoglobin: 11.4 g/dL — ABNORMAL LOW (ref 13.0–17.0)
MCH: 31.9 pg (ref 26.0–34.0)
MCHC: 33.5 g/dL (ref 30.0–36.0)
MCV: 95.2 fL (ref 80.0–100.0)
Platelets: 142 K/uL — ABNORMAL LOW (ref 150–400)
RBC: 3.57 MIL/uL — ABNORMAL LOW (ref 4.22–5.81)
RDW: 13.5 % (ref 11.5–15.5)
WBC: 6.5 K/uL (ref 4.0–10.5)
nRBC: 0 % (ref 0.0–0.2)

## 2024-05-17 LAB — LIPASE, BLOOD: Lipase: 29 U/L (ref 11–51)

## 2024-05-17 MED ORDER — LEVOFLOXACIN 500 MG PO TABS: 500.0000 mg | ORAL_TABLET | Freq: Every day | ORAL | 0 refills | Status: DC

## 2024-05-17 MED ORDER — LEVOFLOXACIN 500 MG PO TABS
500.0000 mg | ORAL_TABLET | Freq: Once | ORAL | Status: AC
  Administered 2024-05-17: 500 mg via ORAL
  Filled 2024-05-17: qty 1

## 2024-05-17 NOTE — Discharge Instructions (Signed)
 You were seen in the emergency department for the blood in your urine.  You did have a stone in your bladder and had active bleeding.  We placed a Foley catheter and irrigated your bladder.  You did have bacteria in your urine as well so we have given you a short course of antibiotics.  You should follow-up with urology in the clinic in the next few days for management of your stone and catheter.  You should return to the emergency department if you have fevers despite the antibiotics and the significantly worsening pain, if your catheter is no longer draining your urine or if you have any other new or concerning symptoms.

## 2024-05-17 NOTE — ED Triage Notes (Signed)
 Pt arrives POV c/o hematuria and R flank pain that began yesterday. Pt states that hematuria has progressively gotten worse, and now he feels dizzy. Denies blood thinner use. Pt reports hx of kidney stones in distant past.

## 2024-05-17 NOTE — ED Provider Notes (Signed)
 [place] EMERGENCY DEPARTMENT AT [facility] Provider Note   CSN: [id number] Arrival date & time: 05/17/24  9095     Patient presents with: Hematuria   Jared Dunlap is a 89 y.o. male.   HPI   89 year old male with past medical history of kidney stones presents emergency department with right-sided flank pain and hematuria.  Patient states he has had chronic right sided back pain stemming from arthritis, but he has also felt this discomfort with kidney stones.  Has been more prominent over the last 2 days.  Blood in urine started yesterday, described as a reddish-pinkish tinge.  Denies any clots.  Not on anticoagulation.  Now admits this morning he feels fatigued and at times lightheaded.  He admits to urinary frequency but denies any dysuria.  Last kidney stone was about 20 years ago, prior to that he required multiple interventions in regards to kidney stones.  He otherwise denies any GI symptoms, no history of AAA.  Prior to Admission medications   Medication Sig Start Date End Date Taking? Authorizing Provider  acetaminophen  (TYLENOL ) 500 MG tablet Take 2 tablets (1,000 mg total) by mouth every 6 (six) hours as needed. Patient taking differently: Take 500 mg by mouth every 8 (eight) hours as needed for mild pain (pain score 1-3) or moderate pain (pain score 4-6). 02/02/23   Fondaw, Hamp RAMAN, PA  apixaban  (ELIQUIS ) 2.5 MG TABS tablet Take 1 tablet (2.5 mg total) by mouth 2 (two) times daily. 06/18/23   Hill, Valery RAMAN, PA-C  benazepril  (LOTENSIN ) 40 MG tablet TAKE 1 TABLET EVERY DAY Patient taking differently: Take 40 mg by mouth daily. 05/12/17   Burnard Debby LABOR, MD  Calcium  Carbonate Antacid (TUMS PO) Take 1 tablet by mouth daily as needed (Heartburn).    [provider]  Coenzyme Q10 (COQ10) 100 MG CAPS Take 100 mg by mouth daily.    [provider]  fish oil -omega-3 fatty acids 1000 MG capsule Take 2 g by mouth daily.    [provider]   Flaxseed, Linseed, (FLAX SEEDS PO) Take 5 mLs by mouth daily.    [provider]  folic acid  (FOLVITE ) 400 MCG tablet Take 1 tablet (400 mcg total) by mouth daily. 07/20/13   Burnard Debby LABOR, MD  Glucosamine-Chondroitin (OSTEO BI-FLEX REGULAR STRENGTH PO) Take 1 tablet by mouth daily. Triple    [provider]  Lecithin 1200 MG CAPS Take 1,200 mg by mouth daily.    [provider]  metoprolol  tartrate (LOPRESSOR ) 25 MG tablet TAKE 1/2 TABLET TWICE DAILY Patient taking differently: Take 12.5 mg by mouth 2 (two) times daily. 05/12/17   Burnard Debby LABOR, MD  Multiple Vitamins-Minerals (MULTIVITAMIN WITH MINERALS) tablet Take 1 tablet by mouth daily. Men +    [provider]  Multiple Vitamins-Minerals (PRESERVISION AREDS 2) CAPS Take 1 capsule by mouth 2 (two) times daily.    [provider]  ondansetron  (ZOFRAN ) 4 MG tablet Take 1 tablet (4 mg total) by mouth every 8 (eight) hours as needed for nausea or vomiting. 06/18/23 06/17/24  Leigh Valery RAMAN, PA-C  Polyethyl Glycol-Propyl Glycol (SYSTANE ULTRA) 0.4-0.3 % SOLN Place 1 drop into both eyes 2 (two) times daily.    [provider]  pyridOXINE (B-6) 50 MG tablet Take 50 mg by mouth daily.    [provider]  terazosin  (HYTRIN ) 5 MG capsule Take 5 mg by mouth at bedtime. 05/07/23   [provider]  vitamin B-12 (  CYANOCOBALAMIN) 500 MCG tablet Take 500 mcg by mouth daily.    [provider]  vitamin C (ASCORBIC ACID) 500 MG tablet Take 500 mg by mouth 2 (two) times daily.    [provider]    Allergies: Atorvastatin  calcium , Ezetimibe , Ezetimibe -simvastatin, Fluvastatin, Pravastatin sodium, Rosuvastatin, Simvastatin, Welchol [colesevelam hcl], and Yellow jacket venom    Review of Systems  Updated Vital Signs BP 132/89 (BP Location: Left Arm)   Pulse 81   Temp 97.6 F (36.4 C) (Oral)   Resp 16   SpO2 100%   Physical Exam  (all labs ordered are listed,  but only abnormal results are displayed) Labs Reviewed  COMPREHENSIVE METABOLIC PANEL WITH GFR - Abnormal; Notable for the following components:      Result Value   Glucose, Bld 107 (*)    BUN 33 (*)    Creatinine, Ser 1.30 (*)    GFR, Estimated 52 (*)    All other components within normal limits  CBC - Abnormal; Notable for the following components:   RBC 3.57 (*)    Hemoglobin 11.4 (*)    HCT 34.0 (*)    Platelets 142 (*)    All other components within normal limits  LIPASE, BLOOD  URINALYSIS, ROUTINE W REFLEX MICROSCOPIC    EKG: None  Radiology: No results found.   Procedures   Medications Ordered in the ED - No data to display                                  Medical Decision Making Amount and/or Complexity of Data Reviewed Labs: ordered. Radiology: ordered.   89 year old male presents emergency department with concern for hematuria.  Patient has history of kidney stones, was seen previously by Dr. Alvaro in the past. Has mild R flank pain, acute on chronic.  VSS on arrival.  Abdomen is soft and benign.  Blood work shows stable hemoglobin and kidney function.  CT of the abdomen pelvis shows large bladder with a stellate stone as well as a large diverticula and multiple dependent stones with hyperdense material assumed to be active hemorrhage/clot.  Patient has attempted to void multiple times without success.  We attempted to place a triple-lumen Foley as well as a coud without success.  Consulted urology.  Dr. Nieves is aware and will come bedside to place Foley and evaluate the patient and give further recommendations.  Patient signed out pending urology evaluation/recommendation.     Final diagnoses:  None    ED Discharge Orders     None          Bari Roxie HERO, DO 05/17/24 1657

## 2024-05-17 NOTE — ED Notes (Signed)
 Foley catheter attempted with 70fr 3-way x 2, 14 fr straight tip, and 16 fr coude.  Dr. Bari assisted on several attempts. Unable to pass catheter at this time.

## 2024-05-17 NOTE — ED Notes (Signed)
 Extended amount of time to discharge due to patient education.  Patient and family member educated on changing leg bag to big drainage bag and both were provided.  Patient discharge instructions reviewed and suggested he have assist the first few times changing the bag.  Family member understood process, patient did not.  She will assist him.

## 2024-05-17 NOTE — Consult Note (Signed)
 Consultation: Urinary retention, bladder stones Requested by: Dr. Roxie Bough  History of Present Illness: Jared Dunlap is a 89 year old male who following Dr. Alvaro with history of BPH, small bladder diverticulum and bladder stones on terazosin .  He also has a history of ureteral stone status post ureteroscopy.  He presented with bladder pain and inability to urinate. He underwent a CT of the abdomen and pelvis which showed no ureteral stone or hydronephrosis, but there posterior bladder diverticulum with small bladder stones was present and also a larger bladder stone of 3.7 cm.  Nursing tried to place a catheter even a coud catheter and could not.  Urology was consulted.  I reviewed hospital notes, labs, office notes, CT.  Past Medical History:  Diagnosis Date   Arthritis    CAD (coronary artery disease) of artery bypass graft 04/18/2013   CABG x 6 1998; PCI to native LCx 2001    CAD in native artery 02/05/2016   Coronary artery disease    followed by Dr. Burnard - last office visit 06/08/13   Essential hypertension 04/18/2013   History of kidney stones    Hyperlipidemia with target LDL less than 70 04/18/2013   Hypertension    MI (myocardial infarction) (HCC) 1998    x2 MI's   Past Surgical History:  Procedure Laterality Date   BYPASS GRAFT  06/30/1996   CORONARY ARTERY BYPASS GRAFT  06/30/1996   6 vessels   CYSTOSCOPY WITH RETROGRADE PYELOGRAM, URETEROSCOPY AND STENT PLACEMENT Right 05/11/2013   Procedure: CYSTOSCOPY WITH RIGHT RETROGRADE PYELOGRAM,CYSTOLITHOPAXY/DIAGNOSTIC  DIGITAL FLEXIBLE RIGHT URETEROSCOPY WITH STENT PLACEMENT;  Surgeon: Ricardo Alvaro, MD;  Location: [facility];  Service: Urology;  Laterality: Right;   CYSTOSCOPY WITH RETROGRADE PYELOGRAM, URETEROSCOPY AND STENT PLACEMENT Right 06/10/2013   Procedure: CYSTOSCOPY WITH RETROGRADE PYELOGRAM, URETEROSCOPY AND STENT PLACEMENT;  Surgeon: Ricardo Alvaro, MD;  Location: [facility];  Service: Urology;  Laterality: Right;   INNER  EAR SURGERY     JOINT REPLACEMENT     Left hip   KIDNEY STONE SURGERY  06/30/1964   stented coronary artery  06/30/1997   TONSILLECTOMY     TOTAL HIP ARTHROPLASTY Right 06/17/2023   Procedure: TOTAL HIP ARTHROPLASTY ANTERIOR APPROACH;  Surgeon: Fidel Rogue, MD;  Location: [facility];  Service: Orthopedics;  Laterality: Right;    Home Medications:  (Not in a hospital admission)  Allergies:  Allergies  Allergen Reactions   Atorvastatin  Calcium  Other (See Comments)    Caused forgetfulness,and made the joints hurt   Ezetimibe  Other (See Comments)    Caused forgetfulness,and made the joints hurt   Ezetimibe -Simvastatin Other (See Comments)    Caused forgetfulness,and made the joints hurt   Fluvastatin Other (See Comments)    Name brand is Lescol Caused forgetfulness,and made the joints hurt   Pravastatin Sodium Other (See Comments)    Caused forgetfulness,and made the joints hurt   Rosuvastatin Other (See Comments)    Caused forgetfulness,and made the joints hurt   Simvastatin Other (See Comments)    Caused forgetfulness,and made the joints hurt   Welchol [Colesevelam Hcl] Other (See Comments)   Yellow Jacket Venom Other (See Comments)    Extreme pain, hands and ankles became swollen, oozes at site stung    Family History  Problem Relation Age of Onset   Heart disease Mother    Heart disease Father    Heart disease Brother    Cancer Brother    Heart disease Brother    Kidney Stones Brother    Social  History:  reports that he quit smoking about 61 years ago. His smoking use included cigarettes. He has never used smokeless tobacco. He reports that he does not drink alcohol  and does not use drugs.  ROS: A complete review of systems was performed.  All systems are negative except for pertinent findings as noted. ROS   Physical Exam:  Vital signs in last 24 hours: Temp:  [97.6 F (36.4 C)-97.9 F (36.6 C)] 97.8 F (36.6 C) (11/18 1606) Pulse Rate:  [68-81] 74 (11/18  1606) Resp:  [15-19] 19 (11/18 1606) BP: (132-147)/(74-103) 140/103 (11/18 1606) SpO2:  [99 %-100 %] 99 % (11/18 1606) General:  Alert and oriented, No acute distress HEENT: Normocephalic, atraumatic Neck: No JVD or lymphadenopathy Lungs: Regular rate and effort Abdomen: Soft, nontender, nondistended, no abdominal masses Back: No CVA tenderness Extremities: No edema Neurologic: Grossly intact  Procedure: He was prepped and draped in the usual sterile fashion.  A 20 French Foley catheter was placed without difficulty.  Urine 1300 cc.  Urine was dark purple.  I irrigated it to clear.  There was some small clots similar to coffee grounds but no ongoing bleeding or large clots.  Laboratory Data:  Results for orders placed or performed during the hospital encounter of 05/17/24 (from the past 24 hours)  Lipase, blood     Status: None   Collection Time: 05/17/24  9:24 AM  Result Value Ref Range   Lipase 29 11 - 51 U/L  Comprehensive metabolic panel     Status: Abnormal   Collection Time: 05/17/24  9:24 AM  Result Value Ref Range   Sodium 141 135 - 145 mmol/L   Potassium 4.1 3.5 - 5.1 mmol/L   Chloride 108 98 - 111 mmol/L   CO2 23 22 - 32 mmol/L   Glucose, Bld 107 (H) 70 - 99 mg/dL   BUN 33 (H) 8 - 23 mg/dL   Creatinine, Ser 8.69 (H) 0.61 - 1.24 mg/dL   Calcium  9.2 8.9 - 10.3 mg/dL   Total Protein 6.8 6.5 - 8.1 g/dL   Albumin  3.9 3.5 - 5.0 g/dL   AST 32 15 - 41 U/L   ALT 20 0 - 44 U/L   Alkaline Phosphatase 85 38 - 126 U/L   Total Bilirubin 0.5 0.0 - 1.2 mg/dL   GFR, Estimated 52 (L) >60 mL/min   Anion gap 11 5 - 15  CBC     Status: Abnormal   Collection Time: 05/17/24  9:24 AM  Result Value Ref Range   WBC 6.5 4.0 - 10.5 K/uL   RBC 3.57 (L) 4.22 - 5.81 MIL/uL   Hemoglobin 11.4 (L) 13.0 - 17.0 g/dL   HCT 65.9 (L) 60.9 - 47.9 %   MCV 95.2 80.0 - 100.0 fL   MCH 31.9 26.0 - 34.0 pg   MCHC 33.5 30.0 - 36.0 g/dL   RDW 86.4 88.4 - 84.4 %   Platelets 142 (L) 150 - 400 K/uL   nRBC  0.0 0.0 - 0.2 %   No results found for this or any previous visit (from the past 240 hours). Creatinine: Recent Labs    05/17/24 0924  CREATININE 1.30*    Impression/Assessment/plan: BPH, bladder stones, urinary retention-we went over the nature risk and benefits of catheter's because he had heard they cause UTI.  We discussed the benefit outweighs the risks and acute retention.  Instructed to drink plenty of water  and not not too much Coke (he worked at [employer]).  Follow-up  with Dr. Alvaro to plan stone removal.  Discussed with Pam and went over the procedure, postprocedure and follow-up.   Donnice Brooks 05/17/2024, 4:55 PM

## 2024-05-17 NOTE — ED Provider Notes (Signed)
 Patient signed out to me at 1630 by Dr. Bari pending urology evaluation for Foley catheter and disposition recommendations.  In short this is a 89 year old male with a past medical history of kidney stones that presented to the emergency department with flank pain and hematuria.  He had workup performed that showed a large stellate stone in the bladder as well as active hemorrhage and clot within the bladder.  He has been hemodynamically stable here with a stable hemoglobin.  Previous team was unable to place a Foley catheter and urology was consulted.  He was seen by Dr. Nieves who is able to place a Foley catheter with 1300 cc of urine return.  His bladder was irrigated to clear.  He is recommended to follow-up outpatient for stone removal.   Ellouise Richerd POUR, DO 05/17/24 1902

## 2024-05-24 DIAGNOSIS — N323 Diverticulum of bladder: Secondary | ICD-10-CM | POA: Diagnosis not present

## 2024-05-24 DIAGNOSIS — N210 Calculus in bladder: Secondary | ICD-10-CM | POA: Diagnosis not present

## 2024-05-24 DIAGNOSIS — R338 Other retention of urine: Secondary | ICD-10-CM | POA: Diagnosis not present

## 2024-06-09 ENCOUNTER — Other Ambulatory Visit: Payer: Self-pay | Admitting: Urology

## 2024-06-14 ENCOUNTER — Telehealth: Payer: Self-pay | Admitting: Cardiovascular Disease

## 2024-06-14 ENCOUNTER — Other Ambulatory Visit: Payer: Self-pay | Admitting: Urology

## 2024-06-14 NOTE — Telephone Encounter (Signed)
° °  Pre-operative Risk Assessment    Patient Name: Jared Dunlap  DOB: 1931/07/28 MRN: [id number]   Date of last office visit: 01/20/23 Date of next office visit: 08/15/24   Request for Surgical Clearance    Procedure:  CystoscopyPossible stent placement Practical cystectomy   Date of Surgery:  Clearance 07/29/24                                Surgeon:  Dr. Ricardo Likens Surgeon's Group or Practice Name:  [facility]  Phone number:   Fax number:  [phone]   Type of Clearance Requested:   - Medical  - Pharmacy:  Hold Apixaban  (Eliquis )     Type of Anesthesia:  General    Additional requests/questions:    SignedKari Fuelling   06/14/2024, 10:17 AM

## 2024-06-22 NOTE — Telephone Encounter (Signed)
 Appears as though patient was only on Eliquis  for DVT prophylaxis post hip replacement.  He will have finished his 30-day supply by the time of his procedure.  Any further questions for this should be directed to orthopedics who prescribed.

## 2024-06-22 NOTE — Telephone Encounter (Signed)
 Tried contacting patient to schedule IN OFFICE VISIT no answer unable to leave a vm will try again at a later time

## 2024-06-22 NOTE — Telephone Encounter (Signed)
" ° ° °  Primary Cardiologist:Thomas Burnard, MD (Inactive)  Chart reviewed as part of pre-operative protocol coverage. Because of Jared Dunlap past medical history and time since last visit, he/she will require a follow-up Office visit in order to better assess preoperative cardiovascular risk.  Pre-op covering staff: - Please schedule appointment and call patient to inform them. - Please contact requesting surgeon's office via preferred method (i.e, phone, fax) to inform them of need for appointment prior to surgery.  If applicable, this message will also be routed to pharmacy pool and/or primary cardiologist for input on holding anticoagulant/antiplatelet agent as requested below so that this information is available at time of patient's appointment.   Per pharmacy Appears as though patient was only on Eliquis  for DVT prophylaxis post hip replacement. He will have finished his 30-day supply by the time of his procedure. Any further questions for this should be directed to orthopedics who prescribed.  [name], NP  06/22/2024, 10:30 AM   "

## 2024-06-24 NOTE — Telephone Encounter (Signed)
 Pre-op clearance has been added to the patient's appointment on 07/06/24 with Dr. Francyne. Will remove from our pool.

## 2024-07-06 ENCOUNTER — Other Ambulatory Visit (HOSPITAL_COMMUNITY): Payer: Self-pay

## 2024-07-06 ENCOUNTER — Ambulatory Visit: Attending: Cardiovascular Disease | Admitting: Cardiovascular Disease

## 2024-07-06 ENCOUNTER — Telehealth: Payer: Self-pay

## 2024-07-06 ENCOUNTER — Encounter: Payer: Self-pay | Admitting: Cardiovascular Disease

## 2024-07-06 ENCOUNTER — Telehealth: Payer: Self-pay | Admitting: Pharmacy Technician

## 2024-07-06 VITALS — BP 134/62 | HR 71 | Ht 64.0 in | Wt 163.0 lb

## 2024-07-06 DIAGNOSIS — N210 Calculus in bladder: Secondary | ICD-10-CM | POA: Diagnosis not present

## 2024-07-06 DIAGNOSIS — I5189 Other ill-defined heart diseases: Secondary | ICD-10-CM | POA: Diagnosis not present

## 2024-07-06 DIAGNOSIS — I25810 Atherosclerosis of coronary artery bypass graft(s) without angina pectoris: Secondary | ICD-10-CM

## 2024-07-06 DIAGNOSIS — I10 Essential (primary) hypertension: Secondary | ICD-10-CM | POA: Diagnosis not present

## 2024-07-06 DIAGNOSIS — G720 Drug-induced myopathy: Secondary | ICD-10-CM | POA: Diagnosis not present

## 2024-07-06 DIAGNOSIS — I350 Nonrheumatic aortic (valve) stenosis: Secondary | ICD-10-CM | POA: Diagnosis not present

## 2024-07-06 DIAGNOSIS — Z01810 Encounter for preprocedural cardiovascular examination: Secondary | ICD-10-CM | POA: Diagnosis not present

## 2024-07-06 DIAGNOSIS — T466X5A Adverse effect of antihyperlipidemic and antiarteriosclerotic drugs, initial encounter: Secondary | ICD-10-CM | POA: Diagnosis not present

## 2024-07-06 DIAGNOSIS — E7800 Pure hypercholesterolemia, unspecified: Secondary | ICD-10-CM

## 2024-07-06 NOTE — Patient Instructions (Signed)
 Medication Instructions:  Your physician recommends that you continue on your current medications as directed. Please refer to the Current Medication list given to you today.  *If you need a refill on your cardiac medications before your next appointment, please call your pharmacy*  Testing/Procedures: Your physician has requested that you have an echocardiogram. Echocardiography is a painless test that uses sound waves to create images of your heart. It provides your doctor with information about the size and shape of your heart and how well your hearts chambers and valves are working. This procedure takes approximately one hour. There are no restrictions for this procedure. Please do NOT wear cologne, perfume, aftershave, or lotions (deodorant is allowed). Please arrive 15 minutes prior to your appointment time.  Please note: We ask at that you not bring children with you during ultrasound (echo/ vascular) testing. Due to room size and safety concerns, children are not allowed in the ultrasound rooms during exams. Our front office staff cannot provide observation of children in our lobby area while testing is being conducted. An adult accompanying a patient to their appointment will only be allowed in the ultrasound room at the discretion of the ultrasound technician under special circumstances. We apologize for any inconvenience.   Follow-Up: At [facility], you and your health needs are our priority.  As part of our continuing mission to provide you with exceptional heart care, our providers are all part of one team.  This team includes your primary Cardiologist (physician) and Advanced Practice Providers or APPs (Physician Assistants and Nurse Practitioners) who all work together to provide you with the care you need, when you need it.  Your next appointment:   1 year(s)  Provider:   Jerel Balding, MD

## 2024-07-06 NOTE — Telephone Encounter (Signed)
 Spoke with pts daughter regarding starting Repatha . Pts daughter was advise that per Corean Crawford CPhT,   Medication was approved -297.00 for 1 month. We could apply for a [employer] grant if he would like.   Pts daughter stated she would have to discuss with her father and would call us  back once he has made a decision. Pts daughter thanked me for the call.

## 2024-07-06 NOTE — Progress Notes (Signed)
 " Cardiology Office Note   Date:  07/06/2024  ID:  TAESEAN RETH, DOB [birth year], MRN [id number] PCP: Leonel Cole, MD  [facility] Providers Cardiologist:  Jerel Balding, MD     History of Present Illness MERIC JOYE is a 89 y.o. male with a longstanding history of CAD (CABG x 6 [phone], PCI with bare-metal stent to native circumflex 200, no intervention since), moderate aortic valve stenosis, preserved left ventricular systolic function with grade 2 diastolic dysfunction but without overt heart failure, HTN, hypercholesterolemia, statin myopathy, longstanding history of nephrolithiasis requiring previous interventions, who has several urinary bladder stones and a bladder diverticulum, requesting preoperative cardiovascular evaluation before bladder surgery.  This is my first time as Agustin's cardiologist, subsequent to Dr. Joesphine retirement.  However I have known him for many years since I have been managing his wife's Jacquelynne) pacemaker.  Zaion is remarkably physically active for 89 year old.  He takes care of his own yard work as well as 2 separate blocks that he owns.  He underwent hip replacement surgery roughly a year ago, without cardiac complications.  He has recovered quite well from that surgery and walks without any assistive devices.  The patient specifically denies any chest pain at rest or with exertion, dyspnea at rest or with exertion, orthopnea, paroxysmal nocturnal dyspnea, syncope, palpitations, focal neurological deficits, intermittent claudication, lower extremity edema, unexplained weight gain, cough, hemoptysis or wheezing.  He developed gross hematuria and leading to interruption of his apixaban .  Cystoscopy demonstrates several large urinary bladder stones including 1 inside a diverticulum.  He has an indwelling Foley catheter at this time.  He is scheduled on January 31 for urinary bladder surgery with Dr. Alvaro.  He had a low risk nuclear myocardial perfusion  study in August 2021 with a mild fixed anterior defect that is old and an ejection fraction of 46%.  More recently he had an echocardiogram in August 2024 showing left ventricular systolic function 50-55% and diastolic dysfunction with evidence of elevated filling pressures (E/a ratio 0.68, E/e' 15-20), although he did not have any complaints of exertional dyspnea.  He had moderate aortic stenosis with a mean gradient of 14 mmHg and a dimensionless valve index of 0.36, calculated aortic valve area 1.13 cm.  Carotid duplex ultrasound in August 2024 showed less than 40% plaque in the left internal carotid artery and no evidence of stenosis on the right, but a greater than 50% stenosis in the left external carotid artery.  His most recent lipid profile from November showed total cholesterol 193, HDL 44, LDL 137, triglycerides 66 and his hemoglobin A1c was borderline at 5.8%.  Mild renal dysfunction with a creatinine of 1.3.  He has had side effects with multiple agents (atorvastatin , ezetimibe , fluvastatin, pravastatin, rosuvastatin, simvastatin) which all caused either musculoskeletal pain or forgetfulness or both.  He also had side effects with WelChol, although he does not recall what happened.  Dr. Burnard has offered him treatment with Repatha  or Praluent in the past but he declined because the cost was too high.  He is also seeing anecdotal evidence of patients who started medicines to lower their cholesterol and then had bad outcomes.  Studies Reviewed     Personally reviewed his most recent ECG from 05/17/2024 which shows sinus rhythm, borderline voltage criteria for LVH with isolated ST segment depression and T wave inversion in V6, with a pattern suggesting secondary repolarization changes.  Echocardiogram August 2024 Myocardial SPECT study August 2021  Risk Assessment/Calculations  Physical Exam VS:  BP 134/62 (BP Location: Left Arm, Patient Position: Sitting, Cuff Size: Normal)    Pulse 71   Ht 5' 4 (1.626 m)   Wt 163 lb (73.9 kg)   SpO2 97%   BMI 27.98 kg/m        Wt Readings from Last 3 Encounters:  07/06/24 163 lb (73.9 kg)  06/17/23 160 lb 15 oz (73 kg)  06/11/23 162 lb (73.5 kg)    GEN: Well nourished, well developed in no acute distress NECK: No JVD;  carotid bruits are faint, radiating from the chest CARDIAC: Normal S1 and distinct loud S2, RRR, early peaking 3/6 aortic ejection murmur, no diastolic murmurs, rubs, gallops RESPIRATORY:  Clear to auscultation without rales, wheezing or rhonchi  ABDOMEN: Soft, non-tender, non-distended EXTREMITIES:  No edema; No deformity   ASSESSMENT AND PLAN S/p CABG and LCX stent: He is asymptomatic, functional class I.  It has been over 20 years since he last required a coronary revascularization.  The only medication with antianginal potential that he takes is metoprolol  and a very low-dose.  He has excellent functional status.  He is not taking aspirin currently, on hold due to hematuria.  He is on beta-blockers.  He is not receiving any lipid-lowering medications currently. AS: Moderate by the most recent echocardiogram.  Clinically asymptomatic.  Still has a very distinct second heart sound and an early peaking aortic ejection murmur.  The valvular abnormality does not yet appear to be clinically important.  He does require yearly echo follow-up, but I do not think the aortic stenosis will preclude his planned bladder surgery. Diastolic dysfunction: Echo shows evidence of impaired relaxation and elevated left heart filling pressures, but has never had clinical heart failure.  He does not require diuretics at this time. HTN: He has a history of hypertension but currently has normal blood pressure just on a tiny dose of metoprolol . HLP: I am very concerned that he has not been receiving lipid-lowering therapy for a long period of time.  This increases the likelihood of progression of disease, although he is currently  asymptomatic.  Strongly encouraged him to consider treatment with Repatha  or Praluent.  He remains very skeptical.  Cost is not the only barrier, he is also worried about anecdotes of some of his friends who were bragging about how low their cholesterol was and then had poor outcomes.  I spent some time pointing out that PCSK9 inhibitors are generally very well-tolerated, without any muscular or cognitive side effects, following very rigorous clinical trials.  His daughter is actually taking Repatha  without a problem.  Will look into the cost of the medication and keep gently encouraging him to allow us  to prescribe lipid-lowering therapy. Preoperative cardiovascular evaluation: All his cardiac problems are currently well compensated.  Despite his advanced age she has excellent functional status.  He has moderately increased surgical risk primarily due to his age, but he did great with hip replacement surgery last year.  I do not think we would need to make any changes in his medications or perform additional cardiac evaluation before his planned bladder surgery. CKD3: Stable GFR around 50. Urolithiasis with multiple urinary bladder stones       Dispo: Echocardiogram for aortic stenosis.  Moderate risk for bladder surgery.  Follow-up yearly.  Signed, Jerel Balding, MD   "

## 2024-07-06 NOTE — Telephone Encounter (Signed)
 Pharmacy Patient Advocate Encounter   Received notification from office-chris that prior authorization for repatha  is required/requested.   Insurance verification completed.   The patient is insured through [employer].   Per test claim: PA required; PA submitted to above mentioned insurance via Latent Key/confirmation #/EOC [id number] Status is pending   Pharmacy Patient Advocate Encounter  Received notification from [employer] that Prior Authorization for repatha  has been APPROVED from 06/30/24 to 06/29/25. Ran test claim, Copay is $297.00 then next month is 47.00. This test claim was processed through [facility]- copay amounts may vary at other pharmacies due to pharmacy/plan contracts, or as the patient moves through the different stages of their insurance plan.   PA #/Case ID/Reference #: [id number]    April/chris aware

## 2024-07-08 ENCOUNTER — Encounter: Payer: Self-pay | Admitting: Cardiovascular Disease

## 2024-07-14 ENCOUNTER — Encounter (HOSPITAL_COMMUNITY): Admission: RE | Admit: 2024-07-14 | Source: Ambulatory Visit

## 2024-07-20 NOTE — Patient Instructions (Signed)
 SURGICAL WAITING ROOM VISITATION Patients having surgery or a procedure may have no more than 2 support people in the waiting area - these visitors may rotate in the visitor waiting room.   Due to an increase in RSV and influenza rates and associated hospitalizations, children ages 15 and under may not visit patients in [facility] hospitals. If the patient needs to stay at the hospital during part of their recovery, the visitor guidelines for inpatient rooms apply.  PRE-OP VISITATION  Pre-op nurse will coordinate an appropriate time for 1 support person to accompany the patient in pre-op.  This support person may not rotate.  This visitor will be contacted when the time is appropriate for the visitor to come back in the pre-op area.  Please refer to the [facility] website for the visitor guidelines for Inpatients (after your surgery is over and you are in a regular room).  You are not required to quarantine at this time prior to your surgery. However, you must do this: Hand Hygiene often Do NOT share personal items Notify your provider if you are in close contact with someone who has COVID or you develop fever 100.4 or greater, new onset of sneezing, cough, sore throat, shortness of breath or body aches.  If you test positive for Covid or have been in contact with anyone that has tested positive in the last 10 days please notify you surgeon.    Your procedure is scheduled on:  07/29/24  Report to [facility] Main Entrance: [place] entrance where the [employer] is available.   Report to admitting at: 6:30 AM  Call this number if you have any questions or problems the morning of surgery [phone]  FOLLOW ANY ADDITIONAL PRE OP INSTRUCTIONS YOU RECEIVED FROM YOUR SURGEON'S OFFICE!!! : Bowel prep.  Clear liquids starting the day before surgery until: midnight the DAY BEFORE OF SURGERY.  Clear Liquid Diet Water  Black Coffee (sugar ok, NO MILK/CREAM OR CREAMERS)  Tea (sugar  ok, NO MILK/CREAM OR CREAMERS) regular and decaf                             Plain Jell-O  with no fruit (NO RED)                                           Fruit ices (not with fruit pulp, NO RED)                                     Popsicles (NO RED)                                                                  Juice: NO CITRUS JUICES: only apple, WHITE grape, WHITE cranberry Sports drinks like Gatorade or Powerade (NO RED)   Oral Hygiene is also important to reduce your risk of infection.        Remember - BRUSH YOUR TEETH THE MORNING OF SURGERY WITH YOUR REGULAR TOOTHPASTE  Do NOT smoke after Midnight the night before surgery.  STOP TAKING all Vitamins, Herbs and supplements 1 week before your surgery.   Take ONLY these medicines the morning of surgery with A SIP OF WATER : Metoprolol (Toprol ).Tylenol  as needed.  If You have been diagnosed with Sleep Apnea - Bring CPAP mask and tubing day of surgery. We will provide you with a CPAP machine on the day of your surgery.                   You may not have any metal on your body including hair pins, jewelry, and body piercing  Do not wear lotions, powders, perfumes / cologne, or deodorant  Men may shave face and neck.  Contacts, Hearing Aids, dentures or bridgework may not be worn into surgery. DENTURES WILL BE REMOVED PRIOR TO SURGERY PLEASE DO NOT APPLY Poly grip OR ADHESIVES!!!  You may bring a small overnight bag with you on the day of surgery, only pack items that are not valuable. [facility] IS NOT RESPONSIBLE   FOR VALUABLES THAT ARE LOST OR STOLEN.   Patients discharged on the day of surgery will not be allowed to drive home.  Someone NEEDS to stay with you for the first 24 hours after anesthesia.  Do not bring your home medications to the hospital. The Pharmacy will dispense medications listed on your medication list to you during your admission in the Hospital.  Special Instructions: Bring a copy of your healthcare power  of attorney and living will documents the day of surgery, if you wish to have them scanned into your [facility]- EPIC  Please read over the following fact sheets you were given: IF YOU HAVE QUESTIONS ABOUT YOUR PRE-OP INSTRUCTIONS, PLEASE CALL [phone]   [facility] - Preparing for Surgery      Before surgery, you can play an important role.  Because skin is not sterile, your skin needs to be as free of germs as possible.  You can reduce the number of germs on your skin by washing with CHG (chlorahexidine gluconate) soap before surgery.  CHG is an antiseptic cleaner which kills germs and bonds with the skin to continue killing germs even after washing. Please DO NOT use if you have an allergy to CHG or antibacterial soaps.  If your skin becomes reddened/irritated stop using the CHG and inform your nurse when you arrive at Short Stay. Do not shave (including legs and underarms) for at least 48 hours prior to the first CHG shower.  You may shave your face/neck.  Please follow these instructions carefully:  1.  Shower with CHG Soap the night before surgery ONLY (DO NOT USE THE SOAP THE MORNING OF SURGERY).  2.  If you choose to wash your hair, wash your hair first as usual with your normal  shampoo.  3.  After you shampoo, rinse your hair and body thoroughly to remove the shampoo.                             4.  Use CHG as you would any other liquid soap.  You can apply chg directly to the skin and wash.  Gently with a scrungie or clean washcloth.  5.  Apply the CHG Soap to your body ONLY FROM THE NECK DOWN.   Do not use on face/ open  Wound or open sores. Avoid contact with eyes, ears mouth and genitals (private parts).                       Wash face,  Genitals (private parts) with your normal soap.             6.  Wash thoroughly, paying special attention to the area where your  surgery  will be performed.  7.  Thoroughly rinse your body with warm water   from the neck down.  8.  DO NOT shower/wash with your normal soap after using and rinsing off the CHG Soap.                9.  Pat yourself dry with a clean towel.            10.  Wear clean pajamas.            11.  Place clean sheets on your bed the night of your first shower and do not  sleep with pets.  Day of Surgery : Do not apply any CHG, lotions/deodorants the morning of surgery.  Please wear clean clothes to the hospital/surgery center.   FAILURE TO FOLLOW THESE INSTRUCTIONS MAY RESULT IN THE CANCELLATION OF YOUR SURGERY  PATIENT SIGNATURE_________________________________  NURSE SIGNATURE__________________________________  ________________________________________________________________________  WHAT IS A BLOOD TRANSFUSION? Blood Transfusion Information  A transfusion is the replacement of blood or some of its parts. Blood is made up of multiple cells which provide different functions. Red blood cells carry oxygen and are used for blood loss replacement. White blood cells fight against infection. Platelets control bleeding. Plasma helps clot blood. Other blood products are available for specialized needs, such as hemophilia or other clotting disorders. BEFORE THE TRANSFUSION  Who gives blood for transfusions?  Healthy volunteers who are fully evaluated to make sure their blood is safe. This is blood bank blood. Transfusion therapy is the safest it has ever been in the practice of medicine. Before blood is taken from a donor, a complete history is taken to make sure that person has no history of diseases nor engages in risky social behavior (examples are intravenous drug use or sexual activity with multiple partners). The donor's travel history is screened to minimize risk of transmitting infections, such as malaria. The donated blood is tested for signs of infectious diseases, such as HIV and hepatitis. The blood is then tested to be sure it is compatible with you in order to  minimize the chance of a transfusion reaction. If you or a relative donates blood, this is often done in anticipation of surgery and is not appropriate for emergency situations. It takes many days to process the donated blood. RISKS AND COMPLICATIONS Although transfusion therapy is very safe and saves many lives, the main dangers of transfusion include:  Getting an infectious disease. Developing a transfusion reaction. This is an allergic reaction to something in the blood you were given. Every precaution is taken to prevent this. The decision to have a blood transfusion has been considered carefully by your caregiver before blood is given. Blood is not given unless the benefits outweigh the risks. AFTER THE TRANSFUSION Right after receiving a blood transfusion, you will usually feel much better and more energetic. This is especially true if your red blood cells have gotten low (anemic). The transfusion raises the level of the red blood cells which carry oxygen, and this usually causes an energy increase. The nurse administering the transfusion will  monitor you carefully for complications. HOME CARE INSTRUCTIONS  No special instructions are needed after a transfusion. You may find your energy is better. Speak with your caregiver about any limitations on activity for underlying diseases you may have. SEEK MEDICAL CARE IF:  Your condition is not improving after your transfusion. You develop redness or irritation at the intravenous (IV) site. SEEK IMMEDIATE MEDICAL CARE IF:  Any of the following symptoms occur over the next 12 hours: Shaking chills. You have a temperature by mouth above 102 F (38.9 C), not controlled by medicine. Chest, back, or muscle pain. People around you feel you are not acting correctly or are confused. Shortness of breath or difficulty breathing. Dizziness and fainting. You get a rash or develop hives. You have a decrease in urine output. Your urine turns a dark color  or changes to pink, red, or brown. Any of the following symptoms occur over the next 10 days: You have a temperature by mouth above 102 F (38.9 C), not controlled by medicine. Shortness of breath. Weakness after normal activity. The white part of the eye turns yellow (jaundice). You have a decrease in the amount of urine or are urinating less often. Your urine turns a dark color or changes to pink, red, or brown. Document Released: 06/13/2000 Document Revised: 09/08/2011 Document Reviewed: 01/31/2008 [facility] Main Patient Information 2014 [place], [place].  _______________________________________________________________________

## 2024-07-21 ENCOUNTER — Encounter (HOSPITAL_COMMUNITY): Payer: Self-pay

## 2024-07-21 ENCOUNTER — Other Ambulatory Visit: Payer: Self-pay

## 2024-07-21 ENCOUNTER — Encounter (HOSPITAL_COMMUNITY)
Admission: RE | Admit: 2024-07-21 | Discharge: 2024-07-21 | Disposition: A | Discharge status: Home / Self Care | Source: Ambulatory Visit | Attending: Urology | Admitting: Urology

## 2024-07-21 DIAGNOSIS — Z01812 Encounter for preprocedural laboratory examination: Secondary | ICD-10-CM | POA: Insufficient documentation

## 2024-07-21 LAB — BASIC METABOLIC PANEL WITH GFR
Anion gap: 9 (ref 5–15)
BUN: 38 mg/dL — ABNORMAL HIGH (ref 8–23)
CO2: 24 mmol/L (ref 22–32)
Calcium: 9.4 mg/dL (ref 8.9–10.3)
Chloride: 107 mmol/L (ref 98–111)
Creatinine, Ser: 1.31 mg/dL — ABNORMAL HIGH (ref 0.61–1.24)
GFR, Estimated: 52 mL/min — ABNORMAL LOW
Glucose, Bld: 98 mg/dL (ref 70–99)
Potassium: 4.6 mmol/L (ref 3.5–5.1)
Sodium: 140 mmol/L (ref 135–145)

## 2024-07-21 LAB — CBC
HCT: 36.2 % — ABNORMAL LOW (ref 39.0–52.0)
Hemoglobin: 11.8 g/dL — ABNORMAL LOW (ref 13.0–17.0)
MCH: 32 pg (ref 26.0–34.0)
MCHC: 32.6 g/dL (ref 30.0–36.0)
MCV: 98.1 fL (ref 80.0–100.0)
Platelets: 153 K/uL (ref 150–400)
RBC: 3.69 MIL/uL — ABNORMAL LOW (ref 4.22–5.81)
RDW: 13.5 % (ref 11.5–15.5)
WBC: 7.6 K/uL (ref 4.0–10.5)
nRBC: 0 % (ref 0.0–0.2)

## 2024-07-21 NOTE — Progress Notes (Signed)
 For Anesthesia: PCP - Leonel Cole, MD  Cardiologist - Croitoru, Jerel, MD LOV: 07/06/24  Bowel Prep reminder:Reviewed.  Chest x-ray -  EKG - 05/18/24 Stress Test -  ECHO - 02/12/23 Cardiac Cath -  Pacemaker/ICD device last checked: Pacemaker orders received: Device Rep notified:  Spinal Cord Stimulator:N/A  Sleep Study - N/A CPAP -   Fasting Blood Sugar - N/A Checks Blood Sugar _____ times a day Date and result of last Hgb A1c-  Last dose of GLP1 agonist- N/A GLP1 instructions: Hold 7 days prior to schedule (Hold 24 hours-daily)   Last dose of SGLT-2 inhibitors- N/A SGLT-2 instructions: Hold 72 hours prior to surgery  Blood Thinner Instructions:N/A Last Dose: Time last taken:  Aspirin Instructions:N/A Last Dose: Time last taken:  Activity level: Can go up a flight of stairs and activities of daily living without stopping and without chest pain and/or shortness of breath   Able to exercise without chest pain and/or shortness of breath  Anesthesia review: Hx: CAD(CABG x 6),HTN,Heart attack  Patient denies shortness of breath, fever, cough and chest pain at PAT appointment   Patient verbalized understanding of instructions that were reviewed over the telephone.

## 2024-07-23 NOTE — Progress Notes (Signed)
 " Case: [id number] Date/Time: 07/29/24 0830   Procedures:      CYSTECTOMY, PARTIAL, ROBOT-ASSISTED, LAPAROSCOPIC     CYSTOSCOPY, WITH BLADDER CALCULUS LITHOLAPAXY     CYSTOSCOPY, WITH RETROGRADE PYELOGRAM (Bilateral)     CYSTOSCOPY, FLEXIBLE, WITH STENT REPLACEMENT   Anesthesia type: General   Diagnosis: Calculus in diverticulum of bladder [N21.0]   Pre-op diagnosis: BLADDER STONE   [facility] ROOM 03 / [facility]   Surgeons: Alvaro Ricardo KATHEE Mickey., MD       DISCUSSION:   Last echo 01/2023 for moderate AS, echo ordered for 07/2024  VS: BP (!) 117/59   Pulse 71   Temp 36.7 C (Oral)   Ht 5' 4 (1.626 m)   Wt 69.4 kg   SpO2 100%   BMI 26.26 kg/m   PROVIDERS: Leonel Cole, MD   LABS: {CHL AN LABS REVIEWED:112001::Labs reviewed: Acceptable for surgery.} (all labs ordered are listed, but only abnormal results are displayed)  Labs Reviewed  CBC - Abnormal; Notable for the following components:      Result Value   RBC 3.69 (*)    Hemoglobin 11.8 (*)    HCT 36.2 (*)    All other components within normal limits  BASIC METABOLIC PANEL WITH GFR - Abnormal; Notable for the following components:   BUN 38 (*)    Creatinine, Ser 1.31 (*)    GFR, Estimated 52 (*)    All other components within normal limits  TYPE AND SCREEN     IMAGES:   EKG:   CV:  Past Medical History:  Diagnosis Date   Arthritis    CAD (coronary artery disease) of artery bypass graft 04/18/2013   CABG x 6 1998; PCI to native LCx 2001    CAD in native artery 02/05/2016   Coronary artery disease    followed by Dr. Burnard - last office visit 06/08/13   Essential hypertension 04/18/2013   History of kidney stones    Hyperlipidemia with target LDL less than 70 04/18/2013   Hypertension    MI (myocardial infarction) (HCC) 1998    x2 MI's    Past Surgical History:  Procedure Laterality Date   BYPASS GRAFT  06/30/1996   CORONARY ARTERY BYPASS GRAFT  06/30/1996   6 vessels   CYSTOSCOPY WITH  RETROGRADE PYELOGRAM, URETEROSCOPY AND STENT PLACEMENT Right 05/11/2013   Procedure: CYSTOSCOPY WITH RIGHT RETROGRADE PYELOGRAM,CYSTOLITHOPAXY/DIAGNOSTIC  DIGITAL FLEXIBLE RIGHT URETEROSCOPY WITH STENT PLACEMENT;  Surgeon: Ricardo Alvaro, MD;  Location[facility];  Service: Urology;  Laterality: Right;   CYSTOSCOPY WITH RETROGRADE PYELOGRAM, URETEROSCOPY AND STENT PLACEMENT Right 06/10/2013   Procedure: CYSTOSCOPY WITH RETROGRADE PYELOGRAM, URETEROSCOPY AND STENT PLACEMENT;  Surgeon: Ricardo Alvaro, MD;  Location: [facility];  Service: Urology;  Laterality: Right;   INNER EAR SURGERY     JOINT REPLACEMENT     Left hip   KIDNEY STONE SURGERY  06/30/1964   stented coronary artery  06/30/1997   TONSILLECTOMY     TOTAL HIP ARTHROPLASTY Right 06/17/2023   Procedure: TOTAL HIP ARTHROPLASTY ANTERIOR APPROACH;  Surgeon: Fidel Rogue, MD;  Location: [facility];  Service: Orthopedics;  Laterality: Right;   TOTAL HIP ARTHROPLASTY Left     MEDICATIONS:  acetaminophen  (TYLENOL ) 650 MG CR tablet   benazepril  (LOTENSIN ) 40 MG tablet   Calcium  Carbonate Antacid (TUMS PO)   Coenzyme Q10 (COQ10) 100 MG CAPS   Flaxseed, Linseed, (FLAX SEEDS PO)   folic acid  (FOLVITE ) 400 MCG tablet   Lecithin 1200 MG CAPS  metoprolol  tartrate (LOPRESSOR ) 25 MG tablet   Misc Natural Products (OSTEO BI-FLEX ADV TRIPLE ST PO)   Multiple Vitamins-Minerals (MULTIVITAMIN WITH MINERALS) tablet   Multiple Vitamins-Minerals (PRESERVISION AREDS 2) CAPS   Omega-3 Fatty Acids (FISH OIL  PO)   Polyethyl Glycol-Propyl Glycol (SYSTANE ULTRA) 0.4-0.3 % SOLN   pyridOXINE (B-6) 50 MG tablet   sulfamethoxazole -trimethoprim (BACTRIM DS) 800-160 MG tablet   terazosin  (HYTRIN ) 5 MG capsule   vitamin B-12 (CYANOCOBALAMIN) 500 MCG tablet   vitamin C (ASCORBIC ACID) 500 MG tablet   No current facility-administered medications for this encounter.          "

## 2024-07-26 ENCOUNTER — Encounter (HOSPITAL_COMMUNITY): Payer: Self-pay

## 2024-07-26 NOTE — Anesthesia Preprocedure Evaluation (Addendum)
 "                                  Anesthesia Evaluation  Patient identified by MRN, date of birth, ID band Patient awake    Reviewed: Allergy & Precautions, NPO status , Patient's Chart, lab work & pertinent test results, reviewed documented beta blocker date and time   Airway Mallampati: III  TM Distance: >3 FB     Dental no notable dental hx. (+) Caps, Dental Advisory Given, Teeth Intact   Pulmonary former smoker Snores    breath sounds clear to auscultation + decreased breath sounds      Cardiovascular hypertension, Pt. on medications and Pt. on home beta blockers + CAD, + Past MI, + Cardiac Stents and + CABG  Normal cardiovascular exam+ Valvular Problems/Murmurs AS  Rhythm:Regular Rate:Bradycardia  CABG x 6 1998; PCI to native LCx 2001  EKG 05/18/24 Atrial fibrillation, T wave inversion lateral leads  Echo 02/12/23 1. Left ventricular ejection fraction, by estimation, is 50 to 55%. The  left ventricle has low normal function. The left ventricle has no regional  wall motion abnormalities. There is moderate left ventricular hypertrophy.  Left ventricular diastolic  parameters are consistent with Grade I diastolic dysfunction (impaired  relaxation). Elevated left atrial pressure.   2. Right ventricular systolic function is low normal. The right  ventricular size is normal. There is normal pulmonary artery systolic  pressure.   3. Left atrial size was mildly dilated.   4. MV is thickened Mildly restricted motion. Mean gradient through the  valve is 3 mm Hg (HR 62 bpm). Trivial mitral valve regurgitation. Severe  mitral annular calcification.   5. AV is thickened, calcified with restricted motion. May be functionally  bicuspid with fusion of R and L coronary cusps. Peak and mean gradients  through the valve are 25 and 14 mm Hg respectively AVA (VTI ) is 1.13 cm2  Dimensionless index is 0. 36  Overall consistent with moderate AS> . The aortic valve is  abnormal.  Aortic valve regurgitation is not visualized.   6. The inferior vena cava is normal in size with greater than 50%  respiratory variability, suggesting right atrial pressure of 3 mmHg.     Neuro/Psych  Neuromuscular disease  negative psych ROS   GI/Hepatic negative GI ROS, Neg liver ROS,,,  Endo/Other  HLD  Renal/GU Renal InsufficiencyRenal diseaseHx/o nephrolithiasis  Lab Results      Component                Value               Date                      NA                       140                 07/21/2024                CL                       107                 07/21/2024                K  4.6                 07/21/2024                CO2                      24                  07/21/2024                BUN                      38 (H)              07/21/2024                CREATININE               1.31 (H)            07/21/2024                GFRNONAA                 52 (L)              07/21/2024                CALCIUM                   9.4                 07/21/2024                ALBUMIN                   3.9                 05/17/2024                GLUCOSE                  98                  07/21/2024              Bladder calculus in bladder diverticulum    Musculoskeletal  (+) Arthritis , Osteoarthritis,  S/P right THR   Abdominal  (+) + obese  Peds  Hematology  (+) Blood dyscrasia, anemia Lab Results      Component                Value               Date                      WBC                      7.6                 07/21/2024                HGB                      11.8 (L)            07/21/2024                HCT  36.2 (L)            07/21/2024                MCV                      98.1                07/21/2024                PLT                      153                 07/21/2024              Anesthesia Other Findings   Reproductive/Obstetrics                               Anesthesia Physical Anesthesia Plan  ASA: 3  Anesthesia Plan: General   Post-op Pain Management: Dilaudid  IV, Precedex and Ofirmev  IV (intra-op)*   Induction: Intravenous  PONV Risk Score and Plan: 4 or greater and Treatment may vary due to age or medical condition, Ondansetron  and Dexamethasone   Airway Management Planned: Oral ETT  Additional Equipment: Arterial line and None  Intra-op Plan:   Post-operative Plan: Extubation in OR  Informed Consent: I have reviewed the patients History and Physical, chart, labs and discussed the procedure including the risks, benefits and alternatives for the proposed anesthesia with the patient or authorized representative who has indicated his/her understanding and acceptance.     Interpreter used for interview and Dental advisory given  Plan Discussed with: Anesthesiologist and CRNA  Anesthesia Plan Comments: (See PAT note from 1/22)         Anesthesia Quick Evaluation  "

## 2024-07-28 ENCOUNTER — Encounter (HOSPITAL_COMMUNITY): Payer: Self-pay | Admitting: Urology

## 2024-07-29 ENCOUNTER — Inpatient Hospital Stay (HOSPITAL_COMMUNITY): Payer: Self-pay | Admitting: Medical

## 2024-07-29 ENCOUNTER — Inpatient Hospital Stay (HOSPITAL_COMMUNITY): Payer: Self-pay | Admitting: Anesthesiology

## 2024-07-29 ENCOUNTER — Inpatient Hospital Stay (HOSPITAL_COMMUNITY)

## 2024-07-29 ENCOUNTER — Inpatient Hospital Stay (HOSPITAL_COMMUNITY)
Admission: RE | Admit: 2024-07-29 | Discharge: 2024-07-31 | DRG: 654 | Disposition: A | Source: Ambulatory Visit | Attending: Urology | Admitting: Urology

## 2024-07-29 ENCOUNTER — Encounter (HOSPITAL_COMMUNITY): Admission: RE | Payer: Self-pay | Source: Ambulatory Visit

## 2024-07-29 ENCOUNTER — Encounter (HOSPITAL_COMMUNITY): Payer: Self-pay | Admitting: Urology

## 2024-07-29 ENCOUNTER — Other Ambulatory Visit: Payer: Self-pay

## 2024-07-29 DIAGNOSIS — I2510 Atherosclerotic heart disease of native coronary artery without angina pectoris: Secondary | ICD-10-CM | POA: Diagnosis present

## 2024-07-29 DIAGNOSIS — R339 Retention of urine, unspecified: Secondary | ICD-10-CM | POA: Diagnosis present

## 2024-07-29 DIAGNOSIS — Z96643 Presence of artificial hip joint, bilateral: Secondary | ICD-10-CM | POA: Diagnosis present

## 2024-07-29 DIAGNOSIS — N202 Calculus of kidney with calculus of ureter: Secondary | ICD-10-CM | POA: Diagnosis present

## 2024-07-29 DIAGNOSIS — I252 Old myocardial infarction: Secondary | ICD-10-CM | POA: Diagnosis not present

## 2024-07-29 DIAGNOSIS — M1990 Unspecified osteoarthritis, unspecified site: Secondary | ICD-10-CM | POA: Diagnosis present

## 2024-07-29 DIAGNOSIS — Z886 Allergy status to analgesic agent status: Secondary | ICD-10-CM | POA: Diagnosis not present

## 2024-07-29 DIAGNOSIS — Z888 Allergy status to other drugs, medicaments and biological substances status: Secondary | ICD-10-CM | POA: Diagnosis not present

## 2024-07-29 DIAGNOSIS — N210 Calculus in bladder: Principal | ICD-10-CM | POA: Diagnosis present

## 2024-07-29 DIAGNOSIS — Z8249 Family history of ischemic heart disease and other diseases of the circulatory system: Secondary | ICD-10-CM

## 2024-07-29 DIAGNOSIS — I25810 Atherosclerosis of coronary artery bypass graft(s) without angina pectoris: Secondary | ICD-10-CM | POA: Diagnosis present

## 2024-07-29 DIAGNOSIS — Z79899 Other long term (current) drug therapy: Secondary | ICD-10-CM | POA: Diagnosis not present

## 2024-07-29 DIAGNOSIS — Z87442 Personal history of urinary calculi: Secondary | ICD-10-CM

## 2024-07-29 DIAGNOSIS — Z91030 Bee allergy status: Secondary | ICD-10-CM

## 2024-07-29 DIAGNOSIS — Z955 Presence of coronary angioplasty implant and graft: Secondary | ICD-10-CM | POA: Diagnosis not present

## 2024-07-29 DIAGNOSIS — I10 Essential (primary) hypertension: Secondary | ICD-10-CM

## 2024-07-29 DIAGNOSIS — Z87891 Personal history of nicotine dependence: Secondary | ICD-10-CM

## 2024-07-29 DIAGNOSIS — N323 Diverticulum of bladder: Secondary | ICD-10-CM | POA: Diagnosis present

## 2024-07-29 DIAGNOSIS — E785 Hyperlipidemia, unspecified: Secondary | ICD-10-CM | POA: Diagnosis present

## 2024-07-29 LAB — TYPE AND SCREEN
ABO/RH(D): A POS
Antibody Screen: NEGATIVE

## 2024-07-29 LAB — HEMOGLOBIN AND HEMATOCRIT, BLOOD
HCT: 35 % — ABNORMAL LOW (ref 39.0–52.0)
Hemoglobin: 11.9 g/dL — ABNORMAL LOW (ref 13.0–17.0)

## 2024-07-29 MED ORDER — LIDOCAINE HCL (CARDIAC) PF 100 MG/5ML IV SOSY
PREFILLED_SYRINGE | INTRAVENOUS | Status: DC | PRN
  Administered 2024-07-29: 60 mg via INTRAVENOUS

## 2024-07-29 MED ORDER — ORAL CARE MOUTH RINSE: 15.0000 mL | Freq: Once | OROMUCOSAL | Status: AC

## 2024-07-29 MED ORDER — MAGNESIUM CITRATE PO SOLN: 1.0000 | Freq: Once | ORAL | Status: DC

## 2024-07-29 MED ORDER — SODIUM CHLORIDE (PF) 0.9 % IJ SOLN
INTRAMUSCULAR | Status: AC
  Filled 2024-07-29: qty 20

## 2024-07-29 MED ORDER — FENTANYL CITRATE (PF) 100 MCG/2ML IJ SOLN
INTRAMUSCULAR | Status: AC
  Filled 2024-07-29: qty 2

## 2024-07-29 MED ORDER — HYOSCYAMINE SULFATE 0.125 MG SL SUBL: 0.1250 mg | SUBLINGUAL_TABLET | SUBLINGUAL | Status: DC | PRN

## 2024-07-29 MED ORDER — HYDROMORPHONE HCL 1 MG/ML IJ SOLN
0.5000 mg | INTRAMUSCULAR | Status: DC | PRN
  Administered 2024-07-29: 1 mg via INTRAVENOUS
  Filled 2024-07-29: qty 1

## 2024-07-29 MED ORDER — SUGAMMADEX SODIUM 200 MG/2ML IV SOLN
INTRAVENOUS | Status: DC | PRN
  Administered 2024-07-29: 200 mg via INTRAVENOUS

## 2024-07-29 MED ORDER — VANCOMYCIN HCL IN DEXTROSE 1-5 GM/200ML-% IV SOLN
INTRAVENOUS | Status: AC
  Filled 2024-07-29: qty 200

## 2024-07-29 MED ORDER — ROCURONIUM BROMIDE 10 MG/ML (PF) SYRINGE
PREFILLED_SYRINGE | INTRAVENOUS | Status: AC
  Filled 2024-07-29: qty 10

## 2024-07-29 MED ORDER — FENTANYL CITRATE (PF) 100 MCG/2ML IJ SOLN
INTRAMUSCULAR | Status: DC | PRN
  Administered 2024-07-29 (×2): 25 ug via INTRAVENOUS
  Administered 2024-07-29 (×3): 50 ug via INTRAVENOUS
  Administered 2024-07-29: 100 ug via INTRAVENOUS
  Administered 2024-07-29: 25 ug via INTRAVENOUS
  Administered 2024-07-29: 50 ug via INTRAVENOUS
  Administered 2024-07-29: 25 ug via INTRAVENOUS

## 2024-07-29 MED ORDER — BENAZEPRIL HCL 20 MG PO TABS
40.0000 mg | ORAL_TABLET | Freq: Every day | ORAL | Status: DC
  Administered 2024-07-29 – 2024-07-31 (×3): 40 mg via ORAL
  Filled 2024-07-29 (×3): qty 2

## 2024-07-29 MED ORDER — LACTATED RINGERS IV SOLN: INTRAVENOUS | Status: DC

## 2024-07-29 MED ORDER — HYDROMORPHONE HCL 1 MG/ML IJ SOLN
INTRAMUSCULAR | Status: AC
  Filled 2024-07-29: qty 1

## 2024-07-29 MED ORDER — VANCOMYCIN HCL 1000 MG IV SOLR
INTRAVENOUS | Status: DC | PRN
  Administered 2024-07-29: 1000 mg via INTRAVENOUS

## 2024-07-29 MED ORDER — LACTATED RINGERS IR SOLN
Status: DC | PRN
  Administered 2024-07-29: 1000 mL

## 2024-07-29 MED ORDER — DROPERIDOL 2.5 MG/ML IJ SOLN: 0.6250 mg | Freq: Once | INTRAMUSCULAR | Status: DC | PRN

## 2024-07-29 MED ORDER — OXYCODONE HCL 5 MG PO TABS: 5.0000 mg | ORAL_TABLET | Freq: Once | ORAL | Status: DC | PRN

## 2024-07-29 MED ORDER — SODIUM CHLORIDE 0.9 % IV SOLN: INTRAVENOUS | Status: DC

## 2024-07-29 MED ORDER — SODIUM CHLORIDE (PF) 0.9 % IJ SOLN
INTRAMUSCULAR | Status: DC | PRN
  Administered 2024-07-29: 20 mL

## 2024-07-29 MED ORDER — BUPIVACAINE-EPINEPHRINE (PF) 0.25% -1:200000 IJ SOLN
INTRAMUSCULAR | Status: AC
  Filled 2024-07-29: qty 30

## 2024-07-29 MED ORDER — ONDANSETRON HCL 4 MG/2ML IJ SOLN: 4.0000 mg | Freq: Once | INTRAMUSCULAR | Status: DC | PRN

## 2024-07-29 MED ORDER — CEFAZOLIN SODIUM-DEXTROSE 2-4 GM/100ML-% IV SOLN
2.0000 g | INTRAVENOUS | Status: AC
  Administered 2024-07-29: 2 g via INTRAVENOUS

## 2024-07-29 MED ORDER — SODIUM CHLORIDE 0.9 % IR SOLN
Status: DC | PRN
  Administered 2024-07-29: 3000 mL via INTRAVESICAL

## 2024-07-29 MED ORDER — HYDROCODONE-ACETAMINOPHEN 5-325 MG PO TABS: 1.0000 | ORAL_TABLET | Freq: Four times a day (QID) | ORAL | 0 refills | Status: AC | PRN

## 2024-07-29 MED ORDER — DOCUSATE SODIUM 100 MG PO CAPS
100.0000 mg | ORAL_CAPSULE | Freq: Two times a day (BID) | ORAL | Status: DC
  Administered 2024-07-29 – 2024-07-31 (×3): 100 mg via ORAL
  Filled 2024-07-29 (×5): qty 1

## 2024-07-29 MED ORDER — EPHEDRINE SULFATE-NACL 50-0.9 MG/10ML-% IV SOSY
PREFILLED_SYRINGE | INTRAVENOUS | Status: DC | PRN
  Administered 2024-07-29: 10 mg via INTRAVENOUS

## 2024-07-29 MED ORDER — DIPHENHYDRAMINE HCL 12.5 MG/5ML PO ELIX: 12.5000 mg | ORAL_SOLUTION | Freq: Four times a day (QID) | ORAL | Status: DC | PRN

## 2024-07-29 MED ORDER — DOCUSATE SODIUM 100 MG PO CAPS: 100.0000 mg | ORAL_CAPSULE | Freq: Two times a day (BID) | ORAL | Status: AC

## 2024-07-29 MED ORDER — TERAZOSIN HCL 5 MG PO CAPS
5.0000 mg | ORAL_CAPSULE | Freq: Every day | ORAL | Status: DC
  Administered 2024-07-29 – 2024-07-30 (×2): 5 mg via ORAL
  Filled 2024-07-29 (×2): qty 1

## 2024-07-29 MED ORDER — DIPHENHYDRAMINE HCL 50 MG/ML IJ SOLN: 12.5000 mg | Freq: Four times a day (QID) | INTRAMUSCULAR | Status: DC | PRN

## 2024-07-29 MED ORDER — SUGAMMADEX SODIUM 200 MG/2ML IV SOLN
INTRAVENOUS | Status: AC
  Filled 2024-07-29: qty 2

## 2024-07-29 MED ORDER — OXYCODONE HCL 5 MG/5ML PO SOLN: 5.0000 mg | Freq: Once | ORAL | Status: DC | PRN

## 2024-07-29 MED ORDER — CEFAZOLIN SODIUM-DEXTROSE 2-4 GM/100ML-% IV SOLN
INTRAVENOUS | Status: AC
  Filled 2024-07-29: qty 100

## 2024-07-29 MED ORDER — DEXAMETHASONE SOD PHOSPHATE PF 10 MG/ML IJ SOLN
INTRAMUSCULAR | Status: AC
  Filled 2024-07-29: qty 1

## 2024-07-29 MED ORDER — ROCURONIUM BROMIDE 10 MG/ML (PF) SYRINGE
PREFILLED_SYRINGE | INTRAVENOUS | Status: DC | PRN
  Administered 2024-07-29: 70 mg via INTRAVENOUS
  Administered 2024-07-29: 20 mg via INTRAVENOUS

## 2024-07-29 MED ORDER — TRIPLE ANTIBIOTIC 3.5-400-5000 EX OINT: 1.0000 | TOPICAL_OINTMENT | Freq: Three times a day (TID) | CUTANEOUS | Status: DC | PRN

## 2024-07-29 MED ORDER — BUPIVACAINE LIPOSOME 1.3 % IJ SUSP
INTRAMUSCULAR | Status: DC | PRN
  Administered 2024-07-29: 20 mL

## 2024-07-29 MED ORDER — IOHEXOL 300 MG/ML  SOLN
INTRAMUSCULAR | Status: DC | PRN
  Administered 2024-07-29: 13 mL

## 2024-07-29 MED ORDER — HYDROMORPHONE HCL 1 MG/ML IJ SOLN
0.2500 mg | INTRAMUSCULAR | Status: DC | PRN
  Administered 2024-07-29 (×4): 0.5 mg via INTRAVENOUS

## 2024-07-29 MED ORDER — ONDANSETRON HCL 4 MG/2ML IJ SOLN
INTRAMUSCULAR | Status: DC | PRN
  Administered 2024-07-29: 4 mg via INTRAVENOUS

## 2024-07-29 MED ORDER — PHENYLEPHRINE HCL-NACL 20-0.9 MG/250ML-% IV SOLN
INTRAVENOUS | Status: DC | PRN
  Administered 2024-07-29: 30 ug/min via INTRAVENOUS

## 2024-07-29 MED ORDER — SULFAMETHOXAZOLE-TRIMETHOPRIM 800-160 MG PO TABS: 1.0000 | ORAL_TABLET | Freq: Two times a day (BID) | ORAL | 0 refills | Status: AC

## 2024-07-29 MED ORDER — ONDANSETRON HCL 4 MG/2ML IJ SOLN
INTRAMUSCULAR | Status: AC
  Filled 2024-07-29: qty 2

## 2024-07-29 MED ORDER — STERILE WATER FOR IRRIGATION IR SOLN
Status: DC | PRN
  Administered 2024-07-29: 1000 mL

## 2024-07-29 MED ORDER — OXYCODONE HCL 5 MG PO TABS
5.0000 mg | ORAL_TABLET | ORAL | Status: DC | PRN
  Administered 2024-07-29 – 2024-07-31 (×6): 5 mg via ORAL
  Filled 2024-07-29 (×7): qty 1

## 2024-07-29 MED ORDER — METOPROLOL TARTRATE 25 MG PO TABS
12.5000 mg | ORAL_TABLET | Freq: Two times a day (BID) | ORAL | Status: DC
  Administered 2024-07-29 – 2024-07-31 (×4): 12.5 mg via ORAL
  Filled 2024-07-29 (×4): qty 1

## 2024-07-29 MED ORDER — PROPOFOL 10 MG/ML IV BOLUS
INTRAVENOUS | Status: AC
  Filled 2024-07-29: qty 20

## 2024-07-29 MED ORDER — BUPIVACAINE LIPOSOME 1.3 % IJ SUSP
INTRAMUSCULAR | Status: AC
  Filled 2024-07-29: qty 20

## 2024-07-29 MED ORDER — PROPOFOL 10 MG/ML IV BOLUS
INTRAVENOUS | Status: DC | PRN
  Administered 2024-07-29: 100 mg via INTRAVENOUS

## 2024-07-29 MED ORDER — ACETAMINOPHEN 500 MG PO TABS
1000.0000 mg | ORAL_TABLET | Freq: Four times a day (QID) | ORAL | Status: AC
  Administered 2024-07-29 – 2024-07-30 (×4): 1000 mg via ORAL
  Filled 2024-07-29 (×4): qty 2

## 2024-07-29 MED ORDER — CHLORHEXIDINE GLUCONATE 0.12 % MT SOLN
15.0000 mL | Freq: Once | OROMUCOSAL | Status: AC
  Administered 2024-07-29: 15 mL via OROMUCOSAL

## 2024-07-29 MED ORDER — DEXAMETHASONE SOD PHOSPHATE PF 10 MG/ML IJ SOLN
INTRAMUSCULAR | Status: DC | PRN
  Administered 2024-07-29: 4 mg via INTRAVENOUS

## 2024-07-29 MED ORDER — ONDANSETRON HCL 4 MG/2ML IJ SOLN
4.0000 mg | INTRAMUSCULAR | Status: DC | PRN
  Administered 2024-07-29 – 2024-07-30 (×2): 4 mg via INTRAVENOUS
  Filled 2024-07-29 (×2): qty 2

## 2024-07-29 MED ORDER — PHENYLEPHRINE 80 MCG/ML (10ML) SYRINGE FOR IV PUSH (FOR BLOOD PRESSURE SUPPORT)
PREFILLED_SYRINGE | INTRAVENOUS | Status: DC | PRN
  Administered 2024-07-29: 160 ug via INTRAVENOUS

## 2024-07-29 NOTE — Brief Op Note (Signed)
 07/29/2024  7:31 AM  10:16 AM  PATIENT:  Jared Dunlap  89 y.o. male  PRE-OPERATIVE DIAGNOSIS:  BLADDER STONE, Bladder Diverticulum  POST-OPERATIVE DIAGNOSIS:  BLADDER STONE, Bladder Diverticulum  PROCEDURE:  Procedures with comments: CYSTECTOMY, PARTIAL, ROBOT-ASSISTED, LAPAROSCOPIC (N/A) CYSTOSCOPY, WITH RETROGRADE PYELOGRAM (Bilateral) - ICG INJECTION CYSTOLITHALOPEXY >2.5cm  SURGEON:  Surgeons and Role:    * Manny, Ricardo KATHEE Raddle., MD - Primary  PHYSICIAN ASSISTANT:   ASSISTANTS: Alan Hammonds PA   ANESTHESIA:   local and general  EBL:  50 mL   BLOOD ADMINISTERED:none  DRAINS: 1 - JP to bulb; 2 Foley to gravity   LOCAL MEDICATIONS USED:  MARCAINE      SPECIMEN:  Source of Specimen:  1- bladder diverticulum (pathology); 2 - large bladder stones (given to pt's family)  DISPOSITION OF SPECIMEN:  as per above  COUNTS:  YES  TOURNIQUET:  * No tourniquets in log *  DICTATION: .Other Dictation: Dictation Number [id number]  PLAN OF CARE: Admit for overnight observation  PATIENT DISPOSITION:  PACU - hemodynamically stable.   Delay start of Pharmacological VTE agent (>24hrs) due to surgical blood loss or risk of bleeding: yes

## 2024-07-29 NOTE — Op Note (Unsigned)
 NAMEJYQUAN, Jared Dunlap MEDICAL RECORD NO: [id number] ACCOUNT NO: [id number] DATE OF BIRTH: 04-29-1932 FACILITY: [facility] LOCATION: [facility] PHYSICIAN: Ricardo Likens, MD  Operative Report   DATE OF PROCEDURE: 07/29/2024  SURGEON: Ricardo Likens, MD  PREOPERATIVE DIAGNOSES:   1. Very large *** bladder stone with recurrent hematuria and urinary retention. 2. Large bladder diverticulum.  PROCEDURE PERFORMED: 1. Cystoscopy with bilateral retrograde pyelograms with interpretation. 2. Injection of ICG dye. 3. Robotic laparoscopic partial cystectomy. 4. Cystolitholapaxy stone greater than 2.5 cm.  ESTIMATED BLOOD LOSS:  50 mL.  COMPLICATION:  None.  SPECIMENS: 1. Bladder diverticulum for permanent pathology. 2. Multiple large bladder stones given to patient.  FINDINGS: 1. Very wide mouth large volume diverticulum beginning at a position approximately 3 cm superior to the trigonal ridge and inferior extent almost the apex of the prostate with cystoscopy. 2. Age-appropriate J-hooking of bilateral ureters.  This is well away from the diverticulum.  Decision made not to place stents. 3. Very large volume bladder stones. 4. Complete resolution of all accessible bladder stones and resolution of diverticulum following surgery.  DRAINS: 1. Jackson-Pratt drain to bulb suction. 2.  Foley catheter per urethra to straight drain  ASSISTANT:  Alan Hammonds, PA  INDICATIONS:  The patient is a very pleasant and very vigorous 89 year old man with history of prior urolithiasis.  He was found recently on workup of hematuria and frank urinary retention to have massive volume of bladder stones with a dominant  jack-type stone almost 4 cm and additional innumerable smaller stones associated with a large diverticulum.  His prostate is not enlarged at around 30 grams or so.  And so I felt that his etiology of obstruction was from clots and from mechanical  obstruction from the stone itself and not so much  his prostate which again is not enlarged for age.  I felt that the underlying etiology for all of this was a large diverticulum which allows for urinary stasis and stone formation.  Options were discussed  including a chronic catheter versus surgery to render him stone free.  And given his very good functional baseline and his goals to achieve permanent catheter-free status we agreed upon a path of partial cystectomy removing the bladder diverticulum and  cystolitholapaxy removing all the bladder stones.  He presents for this today.  Informed consent was obtained and placed in the medical record.  DESCRIPTION OF PROCEDURE:  The patient was verified and identified, procedure being cysto retrogrades, possible stenting, robotic partial cystectomy, and cystolitholapaxy was confirmed.  Procedure timeout was performed.  Intravenous antibiotics  administered.  General endotracheal anesthesia was induced.  The patient was placed into a low lithotomy position.  Sterile field was created, prepped and draped the patient's penis, perineum, and proximal thighs using iodine  and his infraxiphoid abdomen  using chlorhexidine  gluconate after clipper shaving and after he was further fastened to the operative table using a 3-inch tape over foam padding across the supraxiphoid chest.  His arms were tucked at his side with gel rolls.  A test of steep  Trendelenburg position was performed and found to be suitably positioned.  An LAVH-type drape was applied.  His *** Foley catheter had been removed prior to prepping.  Cystourethroscopy was initially performed using a 21-French rigid cystoscope with  offset lens.  Inspection of the anterior and posterior urethra was felt to be unremarkable.  Again there was minimal bilobar prostatic hypertrophy.  Inspection of the urinary bladder revealed a massive volume of multifocal bladder stones some  within the  urinary bladder itself and some within a very wide-mouth large diverticulum that  the inferior-most bladder aspect began approximately 3 cm superior to the trigonal ridge.  This was very midline and fairly well away from the ureteral orifices themselves.   To rule out any sort of aberrant ureters that may interfere with resection it was clearly felt that retrograde pyelograms were obtained.  Left retrograde pyelogram was performed.  This revealed a single left ureter, single system left kidney.  There was some mild J-hooking but was otherwise unremarkable.  Next a right retrograde pyelogram was obtained.  Right retrograde pyelogram demonstrated single right ureter, single system right kidney, mild J-hooking.  It was otherwise unremarkable.  Given these cystoscopic findings it was not felt that stenting would be warranted to protect the ureters.  In an  effort to better locate the bladder diverticulum the cystoscope was exchanged for a 24-French injection scope and approximately 2 mL of ICG dye was injected at the diverticulum mouth to allow for tattooing of the structure.  A new Foley catheter was then  placed per urethra for straight drain.  And a high flow low pressure pneumoperitoneum was obtained using a Veress needle technique in the supraumbilical midline having passed the aspiration and drop test.  An 8-mm robotic camera port was then placed in  the same location.  Laparoscopic examination in the peritoneal cavity revealed some moderate sigmoid redundancy evidence of prior diverticulosis without any evidence of active diverticulitis.  Additional ports were then placed as follows: Right  paramedian 8-mm robotic port, right  far lateral 12-mm assist port, right paramedian 5-mm suction port, left paramedian 8-mm robotic port, left far lateral 8-mm robotic port.  Robot was docked and passed the electronic checks.  Limited adhesiolysis was  performed allowing the redundant sigmoid to fall out of the true pelvis which provided better access to the posterior surface of the bladder.   Bladder was quite capacious.  Bladder was filled to approximately 150 mL and using a combination of gross  visual cues and near-infrared fluorescence light the area of diverticulum mouth was located.  The *** midline and posterior bladder.  Dissection proceeded directly in the midline beginning approximately 3 cm superior to the defined mouth of the  diverticulum and the bladder was purposely entered.  Inspection of the bladder revealed very large volume stones.  Sequentially these were very carefully placed into an endoscopic retrieval bag.  So that all accessible stone material was removed.  Again  this is a very large volume of stone.  And the endoscopic retrieval bag was then sealed to prevent inadvertent bacterial seeding.  The diverticulum was again inspected via the small cystotomy and again this is well away from the trigonal ridge but there  was significant posterior tracking of the diverticulum all the way down towards the area of the apex of the prostate.  The diverticulum was then carefully separated from the posterior surface of the bladder first laterally using a cone-down technique as  much as possible.  And again I felt this was well away from the area of the ureteral insertion, which was visualized during this dissection.  The mouth of the diverticulum was then released from the bladder circumferentially allowing development of the  plane between the posterior surface of the true bladder and the anterior surface of the diverticulum.  This plane took us  directly behind the area of the vas deferens and seminal vesicles again essentially behind the prostate.  I was  very happy with the  safety of this.  It was then released apically and the diverticulum was placed into an EndoCatch bag for later retrieval.  Given the plane of dissection carrying us  quite posteriorly digital rectal exam was performed using an indicator glove.  No  evidence of rectal violation was noted.  The ureteral orifices  were once again visualized via the cystotomy and again visibly patent with copious non-foul urine bilaterally.  The cystotomy was then closed first in a left-to-right fashion in a clamshell  technique using full-thickness mucosa and submucosal muscularis bites with running 2-0 V-Lok.  And then a separate imbricating layer in a peritoneal apposition layer was performed superior to inferior over this, which imbricated the area of cystotomy  quite well and allowed peritoneal coverage of the entire area.  The new Foley catheter was in place.  We irrigated quantitatively.  Sponge and needle counts were correct.  Hemostasis was excellent.  A closed suction drain was brought out through the use  of the left lateral most robotic site into the peritoneal cavity.  Robot was then undocked.  The previous right lateral assistant port site was closed at the level of the fascia using a Carter-Thomason suture passer with 0 Vicryl.  Specimen was retrieved  by extending the previous camera port site inferiorly to a total distance of approximately 2.5 cm removing the bladder stones setting them aside to be given to the family and then the diverticulum was set aside for permanent pathology.  The extraction  site was then closed at the level of fascia using figure-of-eight PDS x4 followed by reapproximation of Scarpa's with a running Vicryl.  All incisions sites were infiltrated with dilute lipolyzed Marcaine  and closed at the level of skin using a  subcuticular Monocryl followed by Dermabond.  Procedure was then terminated.  The patient tolerated the procedure well, no immediate periprocedural complications.  The patient was taken to postanesthesia care unit in stable condition.  The plan for  observation and admission.   PUS D: 07/29/2024 10:28:39 am T: 07/29/2024 1:10:00 pm  JOB: [id number]/ [id number]

## 2024-07-29 NOTE — Transfer of Care (Signed)
 Immediate Anesthesia Transfer of Care Note  Patient: Jared Dunlap  Procedure(s) Performed: CYSTECTOMY, PARTIAL, ROBOT-ASSISTED, LAPAROSCOPIC (Pelvis) CYSTOSCOPY, WITH RETROGRADE PYELOGRAM (Bilateral: Pelvis)  Patient Location: PACU  Anesthesia Type:General  Level of Consciousness: awake  Airway & Oxygen Therapy: Patient Spontanous Breathing and Patient connected to face mask oxygen  Post-op Assessment: Report given to RN and Post -op Vital signs reviewed and stable  Post vital signs: Reviewed and stable  Last Vitals:  Vitals Value Taken Time  BP 152/82 07/29/24 10:33  Temp    Pulse 77 07/29/24 10:38  Resp 18 07/29/24 10:38  SpO2 98 % 07/29/24 10:38  Vitals shown include unfiled device data.  Last Pain:  Vitals:   07/29/24 0700  TempSrc:   PainSc: 0-No pain         Complications: No notable events documented.

## 2024-07-29 NOTE — Anesthesia Postprocedure Evaluation (Signed)
"   Anesthesia Post Note  Patient: DRAVON NOTT  Procedure(s) Performed: CYSTECTOMY, PARTIAL, ROBOT-ASSISTED, LAPAROSCOPIC (Pelvis) CYSTOSCOPY, WITH RETROGRADE PYELOGRAM (Bilateral: Pelvis)     Patient location during evaluation: PACU Anesthesia Type: General Level of consciousness: awake and alert and oriented Pain management: pain level controlled Vital Signs Assessment: post-procedure vital signs reviewed and stable Respiratory status: spontaneous breathing, nonlabored ventilation and respiratory function stable Cardiovascular status: blood pressure returned to baseline and stable Postop Assessment: no apparent nausea or vomiting Anesthetic complications: no   No notable events documented.  Last Vitals:  Vitals:   07/29/24 1230 07/29/24 1245  BP: (!) 152/68 (!) 153/77  Pulse: 81 80  Resp: 18 16  Temp:    SpO2: 100% 100%    Last Pain:  Vitals:   07/29/24 1245  TempSrc:   PainSc: Asleep                 Alonza Knisley A.      "

## 2024-07-29 NOTE — Plan of Care (Signed)

## 2024-07-29 NOTE — Anesthesia Procedure Notes (Signed)
 Procedure Name: Intubation Date/Time: 07/29/2024 8:00 AM  Performed by: Metta Andrea NOVAK, CRNAPre-anesthesia Checklist: Patient identified, Emergency Drugs available, Suction available, Patient being monitored and Timeout performed Patient Re-evaluated:Patient Re-evaluated prior to induction Oxygen Delivery Method: Circle system utilized Preoxygenation: Pre-oxygenation with 100% oxygen Induction Type: IV induction Ventilation: Mask ventilation without difficulty Laryngoscope Size: Mac and 4 Grade View: Grade I Tube type: Oral Tube size: 7.5 mm Number of attempts: 1 Airway Equipment and Method: Stylet Placement Confirmation: ETT inserted through vocal cords under direct vision, positive ETCO2 and breath sounds checked- equal and bilateral Secured at: 22.5 cm Tube secured with: Tape Dental Injury: Teeth and Oropharynx as per pre-operative assessment

## 2024-07-29 NOTE — Discharge Instructions (Addendum)
 1- Drain Sites - You may have some mild persistent drainage from old drain site for several days, this is normal. This can be covered with cotton gauze for convenience.  2 - Stiches - Your stitches are all dissolvable. You may notice a "loose thread" at your incisions, these are normal and require no intervention. You may cut them flush to the skin with fingernail clippers if needed for comfort.  3 - Diet - No restrictions  4 - Activity - No heavy lifting / straining (any activities that require valsalva or "bearing down") x 4 weeks. Otherwise, no restrictions.  5 - Bathing - You may shower immediately. Do not take a bath or get into swimming pool where incision sites are submersed in water x 4 weeks.   6 - Catheter - Will remain in place until removed at your next appointment. It may be cleaned with soap and water in the shower. It may be disconnected from the drain bag while in the shower to avoid tripping over the tube. You may apply Neosporin or Vaseline ointment as needed to the tip of the penis where the catheter inserts to reduce friction and irritation in this spot.   7 - When to Call the Doctor - Call MD for any fever >102, any acute wound problems, or any severe nausea / vomiting. You can call the [facility] [phone]) 24 hours a day 365 days a year. It will roll-over to the answering service and on-call physician after hours.   You may resume aspirin, advil, aleve, vitamins, and supplements 7 days after surgery.

## 2024-07-29 NOTE — Anesthesia Procedure Notes (Addendum)
 Arterial Line Insertion Start/End1/30/2026 7:00 AM Performed by: Jerrye Sharper, MD, Therisa Doyal CROME, CRNA, CRNA  Patient location: Pre-op. Preanesthetic checklist: patient identified, IV checked, site marked, risks and benefits discussed, surgical consent, monitors and equipment checked, pre-op evaluation, timeout performed and anesthesia consent Lidocaine  1% used for infiltration Right, radial was placed Catheter size: 20 G Hand hygiene performed  and maximum sterile barriers used  Allen's test indicative of satisfactory collateral circulation Attempts: 2 Following insertion, dressing applied and Biopatch. Post procedure assessment: normal  Post procedure complications: second provider assisted. Patient tolerated the procedure well with no immediate complications. Additional procedure comments: First attempt by Andrea Richard, CRNA Second attempt by Doyal Therisa, CRNA.

## 2024-07-29 NOTE — H&P (Signed)
 Jared Dunlap is an 89 y.o. male.    Chief Complaint: Pre-OP Robotic Partial Cystectomy / Cytolithalopexy, possible left stent  HPI:     1 - Nephrolithiasis - Pre 2014 - spontaneous passage x several, usually every 10-15 years. 04/2013 - Rt URS for 6mm ureteral stone (req pre-stenting as ureter narrow). 04/2024 - CT - punctate L>R renal stones, massive bladder stones as per below.  2 - Lower Urinary Tract Symptom / Recurrent Bladder Stones / Urinary Retention / Bladder Diverticulum - PT with few small bladder stones removed 04/2013. On tamsulosin  at baseline. 04/2024 recurrent retention, catheter placed, CT with massive volume bladder stones 4cm in bladder, another 3-4 cm in posterior divertidulum, os near left UO. Prostae NOT enlarged (35gm).  PMH sig for CAD/CABG/ (follows Dr. [facility]) , bilateral total hip, HLD, HTN.  Today Jared Dunlap is seen to proceed with partial cystectomy / cystolithalopexy, cysto / stent for massive volume bladder stones / retention. Hbg 11.8, Cr 1.3, UCX staph sens bactrim  / gent / vanc. Has been on bactrim  for few days to reduce colonization. Very thoughtful clearance letter from Dr. JAYSON / cardiology recently.    Past Medical History:  Diagnosis Date   Arthritis    CAD (coronary artery disease) of artery bypass graft 04/18/2013   CABG x 6 1998; PCI to native LCx 2001    CAD in native artery 02/05/2016   Coronary artery disease    followed by Dr. Burnard - last office visit 06/08/13   Essential hypertension 04/18/2013   History of kidney stones    Hyperlipidemia with target LDL less than 70 04/18/2013   Hypertension    MI (myocardial infarction) (HCC) 1998    x2 MI's    Past Surgical History:  Procedure Laterality Date   BYPASS GRAFT  06/30/1996   CORONARY ARTERY BYPASS GRAFT  06/30/1996   6 vessels   CYSTOSCOPY WITH RETROGRADE PYELOGRAM, URETEROSCOPY AND STENT PLACEMENT Right 05/11/2013   Procedure: CYSTOSCOPY WITH RIGHT RETROGRADE  PYELOGRAM,CYSTOLITHOPAXY/DIAGNOSTIC  DIGITAL FLEXIBLE RIGHT URETEROSCOPY WITH STENT PLACEMENT;  Surgeon: Ricardo Likens, MD;  Location: [facility];  Service: Urology;  Laterality: Right;   CYSTOSCOPY WITH RETROGRADE PYELOGRAM, URETEROSCOPY AND STENT PLACEMENT Right 06/10/2013   Procedure: CYSTOSCOPY WITH RETROGRADE PYELOGRAM, URETEROSCOPY AND STENT PLACEMENT;  Surgeon: Ricardo Likens, MD;  Location: [facility];  Service: Urology;  Laterality: Right;   INNER EAR SURGERY     JOINT REPLACEMENT     Left hip   KIDNEY STONE SURGERY  06/30/1964   stented coronary artery  06/30/1997   TONSILLECTOMY     TOTAL HIP ARTHROPLASTY Right 06/17/2023   Procedure: TOTAL HIP ARTHROPLASTY ANTERIOR APPROACH;  Surgeon: Fidel Rogue, MD;  Location: [facility];  Service: Orthopedics;  Laterality: Right;   TOTAL HIP ARTHROPLASTY Left     Family History  Problem Relation Age of Onset   Heart disease Mother    Heart disease Father    Heart disease Brother    Cancer Brother    Heart disease Brother    Kidney Stones Brother    Social History:  reports that he quit smoking about 61 years ago. His smoking use included cigarettes. He has never used smokeless tobacco. He reports that he does not drink alcohol  and does not use drugs.  Allergies: Allergies[1]  Medications Prior to Admission  Medication Sig Dispense Refill   acetaminophen  (TYLENOL ) 650 MG CR tablet Take 650 mg by mouth every 8 (eight) hours as needed for pain.  benazepril  (LOTENSIN ) 40 MG tablet TAKE 1 TABLET EVERY DAY (Patient taking differently: Take 40 mg by mouth daily.) 90 tablet 3   Calcium  Carbonate Antacid (TUMS PO) Take 1 tablet by mouth daily as needed (Heartburn).     Coenzyme Q10 (COQ10) 100 MG CAPS Take 100 mg by mouth daily.     Flaxseed, Linseed, (FLAX SEEDS PO) Take 5 mLs by mouth daily.     folic acid  (FOLVITE ) 400 MCG tablet Take 1 tablet (400 mcg total) by mouth daily. 90 tablet 3   Lecithin 1200 MG CAPS Take 1,200 mg by mouth daily.      metoprolol  tartrate (LOPRESSOR ) 25 MG tablet TAKE 1/2 TABLET TWICE DAILY (Patient taking differently: Take 12.5 mg by mouth 2 (two) times daily.) 90 tablet 3   Misc Natural Products (OSTEO BI-FLEX ADV TRIPLE ST PO) Take 1 tablet by mouth daily.     Multiple Vitamins-Minerals (MULTIVITAMIN WITH MINERALS) tablet Take 1 tablet by mouth daily. Men +     Multiple Vitamins-Minerals (PRESERVISION AREDS 2) CAPS Take 1 capsule by mouth 2 (two) times daily.     Omega-3 Fatty Acids (FISH OIL  PO) Take 900 mg by mouth daily.     Polyethyl Glycol-Propyl Glycol (SYSTANE ULTRA) 0.4-0.3 % SOLN Place 1 drop into both eyes daily.     pyridOXINE (B-6) 50 MG tablet Take 50 mg by mouth daily.     terazosin  (HYTRIN ) 5 MG capsule Take 5 mg by mouth at bedtime.     vitamin B-12 (CYANOCOBALAMIN) 500 MCG tablet Take 500 mcg by mouth daily.     vitamin C (ASCORBIC ACID) 500 MG tablet Take 500 mg by mouth 2 (two) times daily.     sulfamethoxazole -trimethoprim  (BACTRIM  DS) 800-160 MG tablet Take 1 tablet by mouth 2 (two) times daily.      No results found for this or any previous visit (from the past 48 hours). No results found.  Review of Systems  Constitutional:  Negative for chills and fever.  Genitourinary:  Positive for difficulty urinating and hematuria.  All other systems reviewed and are negative.   Blood pressure 119/69, pulse 68, temperature 98.2 F (36.8 C), temperature source Oral, resp. rate 16, SpO2 99%. Physical Exam Vitals reviewed.  Constitutional:      Comments: Very vigorous for age, at baseline.   Eyes:     Pupils: Pupils are equal, round, and reactive to light.  Cardiovascular:     Rate and Rhythm: Normal rate.  Pulmonary:     Effort: Pulmonary effort is normal.  Abdominal:     General: Abdomen is flat.  Genitourinary:    Comments: No CVAT, foley in p[lace Musculoskeletal:        General: Normal range of motion.     Cervical back: Normal range of motion.  Skin:    General: Skin is  warm.  Neurological:     General: No focal deficit present.     Mental Status: He is alert.  Psychiatric:        Mood and Affect: Mood normal.      Assessment/Plan  Proceed as planned with cysto / possible left stent, robotic partial cystectomy / cystolithalopexy. Risks, benefits, alternatives, expected peri-op course discussed previously and reiterated today. He understands that his advanced age and CV comorbidity increases peri-op risk including MI / mortality.   Ricardo KATHEE Alvaro Mickey., MD 07/29/2024, 6:33 AM       [1]  Allergies Allergen Reactions   Aspirin  Other Reaction(s): caused bleeding   Atorvastatin  Calcium  Other (See Comments)    Caused forgetfulness,and made the joints hurt   Ezetimibe  Other (See Comments)    Caused forgetfulness,and made the joints hurt   Ezetimibe -Simvastatin Other (See Comments)    Caused forgetfulness,and made the joints hurt   Fluvastatin Other (See Comments)    Name brand is Lescol Caused forgetfulness,and made the joints hurt   Pravastatin Sodium Other (See Comments)    Caused forgetfulness,and made the joints hurt   Rosuvastatin Other (See Comments)    Caused forgetfulness,and made the joints hurt   Simvastatin Other (See Comments)    Caused forgetfulness,and made the joints hurt   Welchol [Colesevelam Hcl] Other (See Comments)   Yellow Jacket Venom Other (See Comments)    Extreme pain, hands and ankles became swollen, oozes at site stung

## 2024-07-30 ENCOUNTER — Encounter (HOSPITAL_COMMUNITY): Payer: Self-pay | Admitting: Urology

## 2024-07-30 LAB — BASIC METABOLIC PANEL WITH GFR
Anion gap: 6 (ref 5–15)
BUN: 25 mg/dL — ABNORMAL HIGH (ref 8–23)
CO2: 25 mmol/L (ref 22–32)
Calcium: 8.5 mg/dL — ABNORMAL LOW (ref 8.9–10.3)
Chloride: 104 mmol/L (ref 98–111)
Creatinine, Ser: 1.59 mg/dL — ABNORMAL HIGH (ref 0.61–1.24)
GFR, Estimated: 41 mL/min — ABNORMAL LOW
Glucose, Bld: 112 mg/dL — ABNORMAL HIGH (ref 70–99)
Potassium: 4.8 mmol/L (ref 3.5–5.1)
Sodium: 135 mmol/L (ref 135–145)

## 2024-07-30 LAB — HEMOGLOBIN AND HEMATOCRIT, BLOOD
HCT: 30.7 % — ABNORMAL LOW (ref 39.0–52.0)
Hemoglobin: 10.2 g/dL — ABNORMAL LOW (ref 13.0–17.0)

## 2024-07-30 MED ORDER — ALUM & MAG HYDROXIDE-SIMETH 200-200-20 MG/5ML PO SUSP
15.0000 mL | Freq: Four times a day (QID) | ORAL | Status: DC | PRN
  Administered 2024-07-30: 15 mL via ORAL
  Filled 2024-07-30: qty 30

## 2024-07-30 MED ORDER — CHLORHEXIDINE GLUCONATE CLOTH 2 % EX PADS
6.0000 | MEDICATED_PAD | Freq: Every day | CUTANEOUS | Status: DC
  Administered 2024-07-30 – 2024-07-31 (×2): 6 via TOPICAL

## 2024-07-30 NOTE — Progress Notes (Signed)
 1 Day Post-Op   Subjective/Chief Complaint:  1 - Large Bladder Stones / Bladder Diverticulum / Urinary Retention - s/p robotic partial cystectomy (diverticulum removal) and cystolithalopexy 07/29/24. Path pending.   Today Jared Dunlap is stable. Ambulated a bit overnight with assist. Pain managable. JP output very low.    Objective: Vital signs in last 24 hours: Temp:  [97.3 F (36.3 C)-98.3 F (36.8 C)] 98 F (36.7 C) (01/31 0345) Pulse Rate:  [69-93] 69 (01/31 0345) Resp:  [8-20] 18 (01/31 0345) BP: (117-155)/(61-115) 155/115 (01/31 0345) SpO2:  [93 %-100 %] 95 % (01/31 0345) Arterial Line BP: (118-192)/(60-85) 154/66 (01/30 1300) Weight:  [69.4 kg] 69.4 kg (01/30 1424) Last BM Date : 07/29/24  Intake/Output from previous day: 01/30 0701 - 01/31 0700 In: 3270.8 [P.O.:480; I.V.:2440.8; IV Piggyback:350] Out: 1645 [Urine:1550; Drains:45; Blood:50] Intake/Output this shift: No intake/output data recorded.  NAD, daughter at bedside, AOx3 NLB-RA SNTND, recent surgical sites c/d/I, JP scant serosanguinous non-foul output. SCD's in place  Lab Results:  Recent Labs    07/29/24 1056  HGB 11.9*  HCT 35.0*   BMET No results for input(s): NA, K, CL, CO2, GLUCOSE, BUN, CREATININE, CALCIUM  in the last 72 hours. PT/INR No results for input(s): LABPROT, INR in the last 72 hours. ABG No results for input(s): PHART, HCO3 in the last 72 hours.  Invalid input(s): PCO2, PO2  Studies/Results: DG C-Arm 1-60 Min-No Report Result Date: 07/29/2024 Fluoroscopy was utilized by the requesting physician.  No radiographic interpretation.    Anti-infectives: Anti-infectives (From admission, onward)    Start     Dose/Rate Route Frequency Ordered Stop   07/29/24 0818  vancomycin  (VANCOCIN ) 1-5 GM/200ML-% IVPB       Note to Pharmacy: Viann Perkins M: cabinet override      07/29/24 0818 07/29/24 2029   07/29/24 9362  ceFAZolin  (ANCEF ) 2-4 GM/100ML-% IVPB        Note to Pharmacy: Armida Hans B: cabinet override      07/29/24 0637 07/29/24 0818   07/29/24 0631  ceFAZolin  (ANCEF ) IVPB 2g/100 mL premix        2 g 200 mL/hr over 30 Minutes Intravenous 30 min pre-op 07/29/24 0631 07/29/24 0802   07/29/24 0000  sulfamethoxazole -trimethoprim  (BACTRIM  DS) 800-160 MG tablet        1 tablet Oral 2 times daily 07/29/24 1024         Assessment/Plan:  Doing well POD 1 s/p bladder stone removal / reconstruction. Ambulate, reg diet, SLIV, likely DC tomorrow based on current progress. Appreciate family staying with him overnight to help reorient / prevent sundowning.    Ricardo KATHEE Alvaro Mickey. 07/30/2024

## 2024-07-30 NOTE — Plan of Care (Signed)

## 2024-07-31 NOTE — Discharge Summary (Signed)
 Date of admission: 07/29/2024  Date of discharge: 07/31/2024  Admission diagnosis: Bladder stones and bladder diverticulum  Discharge diagnosis: Same  Secondary diagnoses: None  History and Physical: For full details, please see admission history and physical. Briefly, Jared Dunlap is a 89 y.o. year old patient with large bladder diverticulum and bladder stones presenting for diverticulectomy and cystolitholopaxy.   Hospital Course:  Patient is s/p robotic partial cystectomy (diverticulum removal) and cystolithalopexy 07/29/24. Path pending. He has ambulated, tolerated a regular diet, pain is well controlled, catheter is draining well and he feels comfortable with caring for it, JP drain with minimal output so it was removed prior to discharge.   He will follow-up on 2/16 for cystogram and trial of void.   Laboratory values:  Recent Labs    07/29/24 1056 07/30/24 0824  HGB 11.9* 10.2*  HCT 35.0* 30.7*   Recent Labs    07/30/24 0824  CREATININE 1.59*    Disposition: Home  Discharge instruction: The patient was instructed to be ambulatory but told to refrain from heavy lifting, strenuous activity, or driving.   Discharge medications:  Allergies as of 07/31/2024       Reactions   Aspirin    Other Reaction(s): caused bleeding   Atorvastatin  Calcium  Other (See Comments)   Caused forgetfulness,and made the joints hurt   Ezetimibe  Other (See Comments)   Caused forgetfulness,and made the joints hurt   Ezetimibe -simvastatin Other (See Comments)   Caused forgetfulness,and made the joints hurt   Fluvastatin Other (See Comments)   Name brand is Lescol Caused forgetfulness,and made the joints hurt   Pravastatin Sodium Other (See Comments)   Caused forgetfulness,and made the joints hurt   Rosuvastatin Other (See Comments)   Caused forgetfulness,and made the joints hurt   Simvastatin Other (See Comments)   Caused forgetfulness,and made the joints hurt   Welchol [colesevelam  Hcl] Other (See Comments)   Yellow Jacket Venom Other (See Comments)   Extreme pain, hands and ankles became swollen, oozes at site stung        Medication List     STOP taking these medications    acetaminophen  650 MG CR tablet Commonly known as: TYLENOL    ascorbic acid 500 MG tablet Commonly known as: VITAMIN C   CoQ10 100 MG Caps   FISH OIL  PO   FLAX SEEDS PO   folic acid  400 MCG tablet Commonly known as: FOLVITE    Lecithin 1200 MG Caps   multivitamin with minerals tablet   OSTEO BI-FLEX ADV TRIPLE ST PO   PreserVision AREDS 2 Caps   vitamin B-12 500 MCG tablet Commonly known as: CYANOCOBALAMIN       TAKE these medications    benazepril  40 MG tablet Commonly known as: LOTENSIN  TAKE 1 TABLET EVERY DAY   docusate sodium  100 MG capsule Commonly known as: COLACE Take 1 capsule (100 mg total) by mouth 2 (two) times daily.   HYDROcodone -acetaminophen  5-325 MG tablet Commonly known as: NORCO/VICODIN Take 1-2 tablets by mouth every 6 (six) hours as needed for moderate pain (pain score 4-6) or severe pain (pain score 7-10).   metoprolol  tartrate 25 MG tablet Commonly known as: LOPRESSOR  TAKE 1/2 TABLET TWICE DAILY   pyridOXINE 50 MG tablet Commonly known as: B-6 Take 50 mg by mouth daily.   sulfamethoxazole -trimethoprim  800-160 MG tablet Commonly known as: BACTRIM  DS Take 1 tablet by mouth 2 (two) times daily. Start the day prior to foley removal appointment What changed: additional instructions   Systane  Ultra 0.4-0.3 % Soln Generic drug: Polyethyl Glycol-Propyl Glycol Place 1 drop into both eyes daily.   terazosin  5 MG capsule Commonly known as: HYTRIN  Take 5 mg by mouth at bedtime.   TUMS PO Take 1 tablet by mouth daily as needed (Heartburn).        Followup:   Follow-up Information     Alvaro Ricardo KATHEE Raddle., MD Follow up on 08/15/2024.   Specialty: Urology Why: at 1:30 for X-Ray, MD visit, and catheter removal. Contact  information: [address] [place] [address] [phone]

## 2024-08-01 LAB — SURGICAL PATHOLOGY

## 2024-08-03 ENCOUNTER — Encounter (HOSPITAL_COMMUNITY): Payer: Self-pay

## 2024-08-03 ENCOUNTER — Other Ambulatory Visit: Payer: Self-pay

## 2024-08-03 ENCOUNTER — Emergency Department (HOSPITAL_COMMUNITY)

## 2024-08-03 ENCOUNTER — Emergency Department (HOSPITAL_COMMUNITY)
Admission: EM | Admit: 2024-08-03 | Discharge: 2024-08-03 | Disposition: A | Attending: Emergency Medicine | Admitting: Emergency Medicine

## 2024-08-03 DIAGNOSIS — I10 Essential (primary) hypertension: Secondary | ICD-10-CM | POA: Insufficient documentation

## 2024-08-03 DIAGNOSIS — R1031 Right lower quadrant pain: Secondary | ICD-10-CM

## 2024-08-03 DIAGNOSIS — I2510 Atherosclerotic heart disease of native coronary artery without angina pectoris: Secondary | ICD-10-CM | POA: Insufficient documentation

## 2024-08-03 DIAGNOSIS — K5900 Constipation, unspecified: Secondary | ICD-10-CM | POA: Insufficient documentation

## 2024-08-03 DIAGNOSIS — Z79899 Other long term (current) drug therapy: Secondary | ICD-10-CM | POA: Insufficient documentation

## 2024-08-03 LAB — URINALYSIS, ROUTINE W REFLEX MICROSCOPIC
Bilirubin Urine: NEGATIVE
Glucose, UA: NEGATIVE mg/dL
Ketones, ur: NEGATIVE mg/dL
Nitrite: NEGATIVE
Protein, ur: NEGATIVE mg/dL
Specific Gravity, Urine: 1.014 (ref 1.005–1.030)
pH: 5 (ref 5.0–8.0)

## 2024-08-03 LAB — COMPREHENSIVE METABOLIC PANEL WITH GFR
ALT: 17 U/L (ref 0–44)
AST: 31 U/L (ref 15–41)
Albumin: 3.4 g/dL — ABNORMAL LOW (ref 3.5–5.0)
Alkaline Phosphatase: 71 U/L (ref 38–126)
Anion gap: 10 (ref 5–15)
BUN: 33 mg/dL — ABNORMAL HIGH (ref 8–23)
CO2: 24 mmol/L (ref 22–32)
Calcium: 8.8 mg/dL — ABNORMAL LOW (ref 8.9–10.3)
Chloride: 103 mmol/L (ref 98–111)
Creatinine, Ser: 1.3 mg/dL — ABNORMAL HIGH (ref 0.61–1.24)
GFR, Estimated: 52 mL/min — ABNORMAL LOW
Glucose, Bld: 98 mg/dL (ref 70–99)
Potassium: 4.4 mmol/L (ref 3.5–5.1)
Sodium: 137 mmol/L (ref 135–145)
Total Bilirubin: 0.5 mg/dL (ref 0.0–1.2)
Total Protein: 6.4 g/dL — ABNORMAL LOW (ref 6.5–8.1)

## 2024-08-03 LAB — CBC WITH DIFFERENTIAL/PLATELET
Abs Immature Granulocytes: 0.02 10*3/uL (ref 0.00–0.07)
Basophils Absolute: 0 10*3/uL (ref 0.0–0.1)
Basophils Relative: 0 %
Eosinophils Absolute: 0.2 10*3/uL (ref 0.0–0.5)
Eosinophils Relative: 3 %
HCT: 32.9 % — ABNORMAL LOW (ref 39.0–52.0)
Hemoglobin: 10.7 g/dL — ABNORMAL LOW (ref 13.0–17.0)
Immature Granulocytes: 0 %
Lymphocytes Relative: 38 %
Lymphs Abs: 3.2 10*3/uL (ref 0.7–4.0)
MCH: 31.2 pg (ref 26.0–34.0)
MCHC: 32.5 g/dL (ref 30.0–36.0)
MCV: 95.9 fL (ref 80.0–100.0)
Monocytes Absolute: 0.8 10*3/uL (ref 0.1–1.0)
Monocytes Relative: 9 %
Neutro Abs: 4.1 10*3/uL (ref 1.7–7.7)
Neutrophils Relative %: 50 %
Platelets: 147 10*3/uL — ABNORMAL LOW (ref 150–400)
RBC: 3.43 MIL/uL — ABNORMAL LOW (ref 4.22–5.81)
RDW: 13.3 % (ref 11.5–15.5)
WBC: 8.4 10*3/uL (ref 4.0–10.5)
nRBC: 0 % (ref 0.0–0.2)

## 2024-08-03 LAB — LIPASE, BLOOD: Lipase: 16 U/L (ref 11–51)

## 2024-08-03 LAB — MAGNESIUM: Magnesium: 2 mg/dL (ref 1.7–2.4)

## 2024-08-03 MED ORDER — IOHEXOL 300 MG/ML  SOLN
80.0000 mL | Freq: Once | INTRAMUSCULAR | Status: AC | PRN
  Administered 2024-08-03: 80 mL via INTRAVENOUS

## 2024-08-03 MED ORDER — LACTATED RINGERS IV BOLUS: 500.0000 mL | Freq: Once | INTRAVENOUS | Status: DC

## 2024-08-03 MED ORDER — FENTANYL CITRATE (PF) 50 MCG/ML IJ SOSY
50.0000 ug | PREFILLED_SYRINGE | Freq: Once | INTRAMUSCULAR | Status: AC
  Administered 2024-08-03: 50 ug via INTRAVENOUS
  Filled 2024-08-03: qty 1

## 2024-08-03 MED ORDER — LACTATED RINGERS IV BOLUS
500.0000 mL | Freq: Once | INTRAVENOUS | Status: AC
  Administered 2024-08-03: 500 mL via INTRAVENOUS

## 2024-08-03 NOTE — Discharge Instructions (Signed)
 Your test results today were reassuring.  Findings on CT scan are expected postoperatively.  Treat your constipation at home with high-dose MiraLAX .  Start with a 5 capful dose.  Follow-up with Dr. Alvaro in office.  Return to the emergency department for any new or worsening symptoms of concern.

## 2024-08-03 NOTE — ED Provider Notes (Incomplete)
" °  [place] EMERGENCY DEPARTMENT AT [facility] Provider Note   CSN: [id number] Arrival date & time: 08/03/24  0001     Patient presents with: No chief complaint on file.   Jared Dunlap is a 89 y.o. male.  {Add pertinent medical, surgical, social history, OB history to YEP:67052} HPI Patient presents for***.  Medical history includes    Prior to Admission medications  Medication Sig Start Date End Date Taking? Authorizing Provider  benazepril  (LOTENSIN ) 40 MG tablet TAKE 1 TABLET EVERY DAY Patient taking differently: Take 40 mg by mouth daily. 05/12/17   Burnard Debby LABOR, MD  Calcium  Carbonate Antacid (TUMS PO) Take 1 tablet by mouth daily as needed (Heartburn).    [provider]  docusate sodium  (COLACE) 100 MG capsule Take 1 capsule (100 mg total) by mouth 2 (two) times daily. 07/29/24   Cory Palma, PA-C  HYDROcodone -acetaminophen  (NORCO/VICODIN) 5-325 MG tablet Take 1-2 tablets by mouth every 6 (six) hours as needed for moderate pain (pain score 4-6) or severe pain (pain score 7-10). 07/29/24   Cory Palma, PA-C  metoprolol  tartrate (LOPRESSOR ) 25 MG tablet TAKE 1/2 TABLET TWICE DAILY Patient taking differently: Take 12.5 mg by mouth 2 (two) times daily. 05/12/17   Burnard Debby LABOR, MD  Polyethyl Glycol-Propyl Glycol (SYSTANE ULTRA) 0.4-0.3 % SOLN Place 1 drop into both eyes daily.    [provider]  pyridOXINE (B-6) 50 MG tablet Take 50 mg by mouth daily.    [provider]  sulfamethoxazole -trimethoprim  (BACTRIM  DS) 800-160 MG tablet Take 1 tablet by mouth 2 (two) times daily. Start the day prior to foley removal appointment 07/29/24   Cory Palma, PA-C  terazosin  (HYTRIN ) 5 MG capsule Take 5 mg by mouth at bedtime. 05/07/23   [provider]    Allergies: Aspirin, Atorvastatin  calcium , Ezetimibe , Ezetimibe -simvastatin, Fluvastatin, Pravastatin sodium, Rosuvastatin, Simvastatin, Welchol [colesevelam hcl], and Yellow jacket  venom    Review of Systems  Updated Vital Signs There were no vitals taken for this visit.  Physical Exam  (all labs ordered are listed, but only abnormal results are displayed) Labs Reviewed - No data to display  EKG: None  Radiology: No results found.  {Document cardiac monitor, telemetry assessment procedure when appropriate:32947} Procedures   Medications Ordered in the ED - No data to display    {Click here for ABCD2, HEART and other calculators REFRESH Note before signing:1}                              Medical Decision Making  ***  {Document critical care time when appropriate  Document review of labs and clinical decision tools ie CHADS2VASC2, etc  Document your independent review of radiology images and any outside records  Document your discussion with family members, caretakers and with consultants  Document social determinants of health affecting pt's care  Document your decision making why or why not admission, treatments were needed:32947:::1}   Final diagnoses:  None    ED Discharge Orders     None        "

## 2024-08-03 NOTE — ED Provider Notes (Signed)
 " [place] EMERGENCY DEPARTMENT AT [facility] Provider Note   CSN: [id number] Arrival date & time: 08/03/24  0001     Patient presents with: Abdominal Pain   Jared Dunlap is a 89 y.o. male.   HPI Patient presents for abdominal pain.  Medical history includes HTN, HLD, CAD, arthritis, nephrolithiasis.  He is 5 days postop from a partial cystectomy for removal of bladder diverticulum with Dr. Alvaro.  He was discharged from the hospital 3 days ago.  JP drain was removed prior to discharge.  Foley catheter was placed.  Patient reports that he has not had a bowel movement since his surgery.  He has been passing gas.  This evening at 10 PM, he had acute onset of a right lower quadrant abdominal pain.  Pain is worsened with any movements.  He took Tylenol  prior to arrival.  He denies any recent fevers, chills, nausea, or vomiting.    Prior to Admission medications  Medication Sig Start Date End Date Taking? Authorizing Provider  benazepril  (LOTENSIN ) 40 MG tablet TAKE 1 TABLET EVERY DAY Patient taking differently: Take 40 mg by mouth daily. 05/12/17   Burnard Debby LABOR, MD  Calcium  Carbonate Antacid (TUMS PO) Take 1 tablet by mouth daily as needed (Heartburn).    [provider]  docusate sodium  (COLACE) 100 MG capsule Take 1 capsule (100 mg total) by mouth 2 (two) times daily. 07/29/24   Cory Palma, PA-C  HYDROcodone -acetaminophen  (NORCO/VICODIN) 5-325 MG tablet Take 1-2 tablets by mouth every 6 (six) hours as needed for moderate pain (pain score 4-6) or severe pain (pain score 7-10). 07/29/24   Cory Palma, PA-C  metoprolol  tartrate (LOPRESSOR ) 25 MG tablet TAKE 1/2 TABLET TWICE DAILY Patient taking differently: Take 12.5 mg by mouth 2 (two) times daily. 05/12/17   Burnard Debby LABOR, MD  Polyethyl Glycol-Propyl Glycol (SYSTANE ULTRA) 0.4-0.3 % SOLN Place 1 drop into both eyes daily.    [provider]  pyridOXINE (B-6) 50 MG tablet Take 50 mg by mouth daily.     [provider]  sulfamethoxazole -trimethoprim  (BACTRIM  DS) 800-160 MG tablet Take 1 tablet by mouth 2 (two) times daily. Start the day prior to foley removal appointment 07/29/24   Cory Palma, PA-C  terazosin  (HYTRIN ) 5 MG capsule Take 5 mg by mouth at bedtime. 05/07/23   [provider]    Allergies: Aspirin, Atorvastatin  calcium , Ezetimibe , Ezetimibe -simvastatin, Fluvastatin, Pravastatin sodium, Rosuvastatin, Simvastatin, Welchol [colesevelam hcl], and Yellow jacket venom    Review of Systems  Gastrointestinal:  Positive for abdominal pain.  All other systems reviewed and are negative.   Updated Vital Signs BP (!) 103/58   Pulse 76   Temp 97.8 F (36.6 C) (Oral)   Resp 20   SpO2 98%   Physical Exam Vitals and nursing note reviewed.  Constitutional:      General: He is not in acute distress.    Appearance: He is well-developed. He is not ill-appearing, toxic-appearing or diaphoretic.  HENT:     Head: Normocephalic and atraumatic.     Mouth/Throat:     Mouth: Mucous membranes are moist.  Eyes:     Conjunctiva/sclera: Conjunctivae normal.  Cardiovascular:     Rate and Rhythm: Normal rate and regular rhythm.  Pulmonary:     Effort: Pulmonary effort is normal. No respiratory distress.  Abdominal:     Palpations: Abdomen is soft.     Tenderness: There is abdominal tenderness in the right lower quadrant. There is  no guarding or rebound.  Musculoskeletal:        General: No swelling.     Cervical back: Neck supple.  Skin:    General: Skin is warm and dry.     Coloration: Skin is not jaundiced or pale.  Neurological:     General: No focal deficit present.     Mental Status: He is alert and oriented to person, place, and time.  Psychiatric:        Mood and Affect: Mood normal.        Behavior: Behavior normal.     (all labs ordered are listed, but only abnormal results are displayed) Labs Reviewed  COMPREHENSIVE METABOLIC PANEL WITH GFR - Abnormal;  Notable for the following components:      Result Value   BUN 33 (*)    Creatinine, Ser 1.30 (*)    Calcium  8.8 (*)    Total Protein 6.4 (*)    Albumin  3.4 (*)    GFR, Estimated 52 (*)    All other components within normal limits  CBC WITH DIFFERENTIAL/PLATELET - Abnormal; Notable for the following components:   RBC 3.43 (*)    Hemoglobin 10.7 (*)    HCT 32.9 (*)    Platelets 147 (*)    All other components within normal limits  URINALYSIS, ROUTINE W REFLEX MICROSCOPIC - Abnormal; Notable for the following components:   APPearance HAZY (*)    Hgb urine dipstick SMALL (*)    Leukocytes,Ua MODERATE (*)    Bacteria, UA RARE (*)    All other components within normal limits  LIPASE, BLOOD  MAGNESIUM     EKG: EKG Interpretation Date/Time:  Wednesday August 03 2024 00:48:15 EST Ventricular Rate:  76 PR Interval:  195 QRS Duration:  102 QT Interval:  383 QTC Calculation: 431 R Axis:   -11  Text Interpretation: Sinus rhythm Confirmed by Melvenia Motto ([id number]) on 08/03/2024 12:52:48 AM  Radiology: CT ABDOMEN PELVIS W CONTRAST Result Date: 08/03/2024 EXAM: CT ABDOMEN AND PELVIS WITH CONTRAST 08/03/2024 01:45:19 AM TECHNIQUE: CT of the abdomen and pelvis was performed with the administration of 80 mL of iohexol  (OMNIPAQUE ) 300 MG/ML solution. Multiplanar reformatted images are provided for review. Automated exposure control, iterative reconstruction, and/or weight-based adjustment of the mA/kV was utilized to reduce the radiation dose to as low as reasonably achievable. COMPARISON: None available. CLINICAL HISTORY: Abdominal pain, acute, nonlocalized. Acute, nonlocalized abdominal pain. FINDINGS: LOWER CHEST: Chronic fibrotic changes in the lung bases. LIVER: The liver is unremarkable. GALLBLADDER AND BILE DUCTS: Gallbladder is unremarkable. No biliary ductal dilatation. SPLEEN: No acute abnormality. PANCREAS: No acute abnormality. ADRENAL GLANDS: No acute abnormality. KIDNEYS, URETERS AND  BLADDER: Nonobstructing  left nephrolithiasis. Bilateral cortical renal scarring. Foley catheter and gas in the bladder. Marked wall thickening of the bladder. There is gas in the left dome of the bladder wall (series 12 image 69). There is a small fluid collection posterior to the bladder with tiny bilateral gas measuring 2.4 x 3.2 cm on series 12 image 73. This may represent the bladder diverticulum seen on 11 / 18 / 25 . Evaluation is limited by streak artifacts from the hip arthroplasties. GI AND BOWEL: Small hiatal hernia. Free gas about the descending colon (series 12 image 49). Colonic diverticulosis without diverticulitis. Stomach demonstrates no acute abnormality. There is no bowel obstruction. PERITONEUM AND RETROPERITONEUM: Free gas is present in the left inguinal hernia. Additional free subcutaneous gas in the anterior abdominal wall. These findings are likely related to  recent partial cystectomy. VASCULATURE: Aorta is normal in caliber. Aorta atherosclerotic calcification. LYMPH NODES: No lymphadenopathy. REPRODUCTIVE ORGANS: No acute abnormality. BONES AND SOFT TISSUES: No acute osseous abnormality. No focal soft tissue abnormality. Evaluation is limited by streak artifacts from the hip arthroplasties. IMPRESSION: 1. Marked bladder wall thickening with gas in the bladder wall. This may be related to recent cystoscopy and partial cystectomy. Emphysematous cystitis not excluded. 2. Small fluid and gas collection posterior to the bladder favored to represent the bladder diverticulum seen on prior CT. Evaluation is limited by streak artifact. 3. Intra and extraperitoneal gas in the abdomen likely related to recent partial cystectomy. 4. Nonobstructing left nephrolithiasis. Electronically signed by: Norman Gatlin MD 08/03/2024 02:13 AM EST RP Workstation: [id number]     Procedures   Medications Ordered in the ED  fentaNYL  (SUBLIMAZE ) injection 50 mcg (50 mcg Intravenous Given 08/03/24 0030)  lactated  ringers  bolus 500 mL (0 mLs Intravenous Stopped 08/03/24 0059)  iohexol  (OMNIPAQUE ) 300 MG/ML solution 80 mL (80 mLs Intravenous Contrast Given 08/03/24 0132)                                    Medical Decision Making Amount and/or Complexity of Data Reviewed Labs: ordered. Radiology: ordered.  Risk Prescription drug management.   This patient presents to the ED for concern of abdominal pain, this involves an extensive number of treatment options, and is a complaint that carries with it a high risk of complications and morbidity.  The differential diagnosis includes appendicitis, colitis, constipation, bowel obstruction, postoperative pain, postoperative complication   Co morbidities / Chronic conditions that complicate the patient evaluation  HTN, HLD, CAD, arthritis, nephrolithiasis   Additional history obtained:  Additional history obtained from EMR External records from outside source obtained and reviewed including patient's daughter   Lab Tests:  I Ordered, and personally interpreted labs.  The pertinent results include: Baseline hemoglobin, no leukocytosis, baseline creatinine, normal electrolytes   Imaging Studies ordered:  I ordered imaging studies including CT of abdomen and pelvis I independently visualized and interpreted imaging which showed bladder wall thickening with gas present; intra and extraperitoneal gas in abdomen.  These findings likely secondary to recent surgery. I agree with the radiologist interpretation   Cardiac Monitoring: / EKG:  The patient was maintained on a cardiac monitor.  I personally viewed and interpreted the cardiac monitored which showed an underlying rhythm of: Sinus rhythm   Problem List / ED Course / Critical interventions / Medication management  Patient presenting for acute onset of right-sided abdominal pain this evening at 10 PM.  On arrival in the ED, he is overall well-appearing.  He is currently 5 days postop from a  partial cystectomy.  On exam, surgical incisions appear to be well-healing.  He has some mild surrounding ecchymosis.  He has pain and tenderness to the right side of his abdomen.  Foley bag has presence of small amount of yellow urine.  Fentanyl  was ordered for analgesia.  Workup was initiated.  Patient's lab work was unremarkable.  There was concern of low blood pressure while in the ED.  On assessment, patient denies any new complaints.  Blood pressure cuff was misplaced.  After adjustment, patient remains normotensive.  On CT imaging, he has a small amount of intraperitoneal gas in addition to thickened bladder wall with gas present in wall.  I spoke with urologist on-call, Dr. Cam, regarding these findings.  He states that these are expected postoperatively given patient's recent procedure.  From a urology standpoint, patient can follow-up as outpatient.  He may be having bladder spasms which can contribute to his pain.  On CT imaging, he does also have some stool buildup on ascending colon.  This is the area of his pain and he would likely benefit from bowel cleanout.  He was advised to take high-dose MiraLAX  at home.  He does state that he would like an enema here in the ED.  This was ordered.  Patient was able to have a bowel movement following enema.  He was discharged in stable condition. I ordered medication including IV fluids for hydration, fentanyl  for analgesia, substance enema for constipation Reevaluation of the patient after these medicines showed that the patient improved I have reviewed the patients home medicines and have made adjustments as needed   Consultations Obtained:  I requested consultation with the urologist, Dr. Cam,  and discussed lab and imaging findings as well as pertinent plan - they recommend: CT findings are expected postoperatively.  Patient can follow-up with urology as outpatient.      Final diagnoses:  Right lower quadrant abdominal pain   Constipation, unspecified constipation type    ED Discharge Orders     None          Melvenia Motto, MD 08/03/24 [phone]  "

## 2024-08-03 NOTE — ED Triage Notes (Signed)
 Pt BIBEMS from home d/t abdominal pain. Had surgery on Friday and no bowel moment since.   158/82 Hr: 82 97% RA

## 2024-08-15 ENCOUNTER — Ambulatory Visit: Admitting: Cardiovascular Disease

## 2024-08-23 ENCOUNTER — Ambulatory Visit (HOSPITAL_COMMUNITY)
